# Patient Record
Sex: Female | Born: 1935
Health system: Southern US, Community
[De-identification: ages and names within clinical notes are randomized; demographics above are authoritative.]

## PROBLEM LIST (undated history)

## (undated) DIAGNOSIS — C50919 Malignant neoplasm of unspecified site of unspecified female breast: Secondary | ICD-10-CM

## (undated) DIAGNOSIS — R0602 Shortness of breath: Secondary | ICD-10-CM

## (undated) DIAGNOSIS — H3530 Unspecified macular degeneration: Secondary | ICD-10-CM

## (undated) DIAGNOSIS — M1990 Unspecified osteoarthritis, unspecified site: Secondary | ICD-10-CM

## (undated) DIAGNOSIS — I5032 Chronic diastolic (congestive) heart failure: Secondary | ICD-10-CM

## (undated) DIAGNOSIS — F419 Anxiety disorder, unspecified: Secondary | ICD-10-CM

## (undated) DIAGNOSIS — J42 Unspecified chronic bronchitis: Secondary | ICD-10-CM

## (undated) DIAGNOSIS — M5090 Cervical disc disorder, unspecified, unspecified cervical region: Secondary | ICD-10-CM

## (undated) DIAGNOSIS — J189 Pneumonia, unspecified organism: Secondary | ICD-10-CM

## (undated) DIAGNOSIS — E559 Vitamin D deficiency, unspecified: Secondary | ICD-10-CM

## (undated) DIAGNOSIS — F32A Depression, unspecified: Secondary | ICD-10-CM

## (undated) DIAGNOSIS — F329 Major depressive disorder, single episode, unspecified: Secondary | ICD-10-CM

## (undated) DIAGNOSIS — J159 Unspecified bacterial pneumonia: Secondary | ICD-10-CM

## (undated) DIAGNOSIS — R600 Localized edema: Secondary | ICD-10-CM

## (undated) DIAGNOSIS — I10 Essential (primary) hypertension: Secondary | ICD-10-CM

## (undated) DIAGNOSIS — I350 Nonrheumatic aortic (valve) stenosis: Secondary | ICD-10-CM

## (undated) DIAGNOSIS — I214 Non-ST elevation (NSTEMI) myocardial infarction: Secondary | ICD-10-CM

## (undated) DIAGNOSIS — R079 Chest pain, unspecified: Secondary | ICD-10-CM

## (undated) DIAGNOSIS — K219 Gastro-esophageal reflux disease without esophagitis: Secondary | ICD-10-CM

## (undated) DIAGNOSIS — E039 Hypothyroidism, unspecified: Secondary | ICD-10-CM

## (undated) HISTORY — DX: Hypothyroidism, unspecified: E03.9

## (undated) HISTORY — PX: CATARACT EXTRACTION W/ INTRAOCULAR LENS  IMPLANT, BILATERAL: SHX1307

## (undated) HISTORY — DX: Chronic diastolic (congestive) heart failure: I50.32

## (undated) HISTORY — DX: Cervical disc disorder, unspecified, unspecified cervical region: M50.90

## (undated) HISTORY — DX: Depression, unspecified: F32.A

## (undated) HISTORY — DX: Major depressive disorder, single episode, unspecified: F32.9

## (undated) HISTORY — DX: Unspecified macular degeneration: H35.30

## (undated) HISTORY — PX: BREAST BIOPSY: SHX20

## (undated) HISTORY — DX: Nonrheumatic aortic (valve) stenosis: I35.0

## (undated) HISTORY — DX: Vitamin D deficiency, unspecified: E55.9

## (undated) HISTORY — DX: Localized edema: R60.0

## (undated) HISTORY — DX: Anxiety disorder, unspecified: F41.9

## (undated) HISTORY — PX: BREAST LUMPECTOMY: SHX2

## (undated) HISTORY — PX: ABDOMINAL HYSTERECTOMY: SHX81

## (undated) HISTORY — DX: Malignant neoplasm of unspecified site of unspecified female breast: C50.919

## (undated) HISTORY — DX: Essential (primary) hypertension: I10

---
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
## 2011-11-14 DIAGNOSIS — M6281 Muscle weakness (generalized): Secondary | ICD-10-CM | POA: Diagnosis not present

## 2011-11-14 DIAGNOSIS — M542 Cervicalgia: Secondary | ICD-10-CM | POA: Diagnosis not present

## 2011-11-14 DIAGNOSIS — R262 Difficulty in walking, not elsewhere classified: Secondary | ICD-10-CM | POA: Diagnosis not present

## 2011-11-14 DIAGNOSIS — M25519 Pain in unspecified shoulder: Secondary | ICD-10-CM | POA: Diagnosis not present

## 2011-11-15 DIAGNOSIS — R011 Cardiac murmur, unspecified: Secondary | ICD-10-CM | POA: Diagnosis not present

## 2011-11-15 DIAGNOSIS — I10 Essential (primary) hypertension: Secondary | ICD-10-CM | POA: Diagnosis not present

## 2011-11-15 DIAGNOSIS — R079 Chest pain, unspecified: Secondary | ICD-10-CM | POA: Diagnosis not present

## 2011-11-15 DIAGNOSIS — R000 Tachycardia, unspecified: Secondary | ICD-10-CM | POA: Diagnosis not present

## 2011-11-19 DIAGNOSIS — M25519 Pain in unspecified shoulder: Secondary | ICD-10-CM | POA: Diagnosis not present

## 2011-11-19 DIAGNOSIS — M6281 Muscle weakness (generalized): Secondary | ICD-10-CM | POA: Diagnosis not present

## 2011-11-19 DIAGNOSIS — R262 Difficulty in walking, not elsewhere classified: Secondary | ICD-10-CM | POA: Diagnosis not present

## 2011-11-19 DIAGNOSIS — M542 Cervicalgia: Secondary | ICD-10-CM | POA: Diagnosis not present

## 2011-11-20 DIAGNOSIS — L0291 Cutaneous abscess, unspecified: Secondary | ICD-10-CM | POA: Diagnosis not present

## 2011-11-20 DIAGNOSIS — M7150 Other bursitis, not elsewhere classified, unspecified site: Secondary | ICD-10-CM | POA: Diagnosis not present

## 2011-11-20 DIAGNOSIS — I359 Nonrheumatic aortic valve disorder, unspecified: Secondary | ICD-10-CM | POA: Diagnosis not present

## 2011-11-20 DIAGNOSIS — L0390 Cellulitis, unspecified: Secondary | ICD-10-CM | POA: Diagnosis not present

## 2011-11-20 DIAGNOSIS — R609 Edema, unspecified: Secondary | ICD-10-CM | POA: Diagnosis not present

## 2011-11-20 DIAGNOSIS — M25559 Pain in unspecified hip: Secondary | ICD-10-CM | POA: Diagnosis not present

## 2011-11-20 DIAGNOSIS — M5412 Radiculopathy, cervical region: Secondary | ICD-10-CM | POA: Diagnosis not present

## 2011-11-21 DIAGNOSIS — M25519 Pain in unspecified shoulder: Secondary | ICD-10-CM | POA: Diagnosis not present

## 2011-11-21 DIAGNOSIS — M542 Cervicalgia: Secondary | ICD-10-CM | POA: Diagnosis not present

## 2011-11-21 DIAGNOSIS — M6281 Muscle weakness (generalized): Secondary | ICD-10-CM | POA: Diagnosis not present

## 2011-11-21 DIAGNOSIS — R262 Difficulty in walking, not elsewhere classified: Secondary | ICD-10-CM | POA: Diagnosis not present

## 2011-11-22 DIAGNOSIS — E871 Hypo-osmolality and hyponatremia: Secondary | ICD-10-CM | POA: Diagnosis not present

## 2011-11-26 DIAGNOSIS — M6281 Muscle weakness (generalized): Secondary | ICD-10-CM | POA: Diagnosis not present

## 2011-11-26 DIAGNOSIS — M542 Cervicalgia: Secondary | ICD-10-CM | POA: Diagnosis not present

## 2011-11-26 DIAGNOSIS — M25519 Pain in unspecified shoulder: Secondary | ICD-10-CM | POA: Diagnosis not present

## 2011-11-26 DIAGNOSIS — R262 Difficulty in walking, not elsewhere classified: Secondary | ICD-10-CM | POA: Diagnosis not present

## 2011-11-27 DIAGNOSIS — R609 Edema, unspecified: Secondary | ICD-10-CM | POA: Diagnosis not present

## 2011-11-27 DIAGNOSIS — I359 Nonrheumatic aortic valve disorder, unspecified: Secondary | ICD-10-CM | POA: Diagnosis not present

## 2011-11-27 DIAGNOSIS — M5126 Other intervertebral disc displacement, lumbar region: Secondary | ICD-10-CM | POA: Diagnosis not present

## 2011-11-27 DIAGNOSIS — M5020 Other cervical disc displacement, unspecified cervical region: Secondary | ICD-10-CM | POA: Diagnosis not present

## 2011-11-28 DIAGNOSIS — M542 Cervicalgia: Secondary | ICD-10-CM | POA: Diagnosis not present

## 2011-11-28 DIAGNOSIS — R262 Difficulty in walking, not elsewhere classified: Secondary | ICD-10-CM | POA: Diagnosis not present

## 2011-11-28 DIAGNOSIS — M25519 Pain in unspecified shoulder: Secondary | ICD-10-CM | POA: Diagnosis not present

## 2011-11-28 DIAGNOSIS — M6281 Muscle weakness (generalized): Secondary | ICD-10-CM | POA: Diagnosis not present

## 2011-11-29 DIAGNOSIS — M5020 Other cervical disc displacement, unspecified cervical region: Secondary | ICD-10-CM | POA: Diagnosis not present

## 2011-11-29 DIAGNOSIS — M5412 Radiculopathy, cervical region: Secondary | ICD-10-CM | POA: Diagnosis not present

## 2011-11-29 DIAGNOSIS — Z79899 Other long term (current) drug therapy: Secondary | ICD-10-CM | POA: Diagnosis not present

## 2011-12-04 DIAGNOSIS — I359 Nonrheumatic aortic valve disorder, unspecified: Secondary | ICD-10-CM | POA: Diagnosis not present

## 2011-12-04 DIAGNOSIS — F329 Major depressive disorder, single episode, unspecified: Secondary | ICD-10-CM | POA: Diagnosis not present

## 2011-12-04 DIAGNOSIS — M5412 Radiculopathy, cervical region: Secondary | ICD-10-CM | POA: Diagnosis not present

## 2011-12-04 DIAGNOSIS — R609 Edema, unspecified: Secondary | ICD-10-CM | POA: Diagnosis not present

## 2011-12-05 DIAGNOSIS — M25519 Pain in unspecified shoulder: Secondary | ICD-10-CM | POA: Diagnosis not present

## 2011-12-05 DIAGNOSIS — M542 Cervicalgia: Secondary | ICD-10-CM | POA: Diagnosis not present

## 2011-12-05 DIAGNOSIS — R262 Difficulty in walking, not elsewhere classified: Secondary | ICD-10-CM | POA: Diagnosis not present

## 2011-12-05 DIAGNOSIS — M6281 Muscle weakness (generalized): Secondary | ICD-10-CM | POA: Diagnosis not present

## 2011-12-07 DIAGNOSIS — M542 Cervicalgia: Secondary | ICD-10-CM | POA: Diagnosis not present

## 2011-12-07 DIAGNOSIS — M25519 Pain in unspecified shoulder: Secondary | ICD-10-CM | POA: Diagnosis not present

## 2011-12-07 DIAGNOSIS — R262 Difficulty in walking, not elsewhere classified: Secondary | ICD-10-CM | POA: Diagnosis not present

## 2011-12-07 DIAGNOSIS — M6281 Muscle weakness (generalized): Secondary | ICD-10-CM | POA: Diagnosis not present

## 2011-12-10 DIAGNOSIS — M79609 Pain in unspecified limb: Secondary | ICD-10-CM | POA: Diagnosis not present

## 2011-12-10 DIAGNOSIS — M545 Low back pain: Secondary | ICD-10-CM | POA: Diagnosis not present

## 2011-12-10 DIAGNOSIS — M76899 Other specified enthesopathies of unspecified lower limb, excluding foot: Secondary | ICD-10-CM | POA: Diagnosis not present

## 2011-12-11 DIAGNOSIS — M5126 Other intervertebral disc displacement, lumbar region: Secondary | ICD-10-CM | POA: Diagnosis not present

## 2011-12-11 DIAGNOSIS — M5020 Other cervical disc displacement, unspecified cervical region: Secondary | ICD-10-CM | POA: Diagnosis not present

## 2011-12-12 DIAGNOSIS — M25519 Pain in unspecified shoulder: Secondary | ICD-10-CM | POA: Diagnosis not present

## 2011-12-12 DIAGNOSIS — R262 Difficulty in walking, not elsewhere classified: Secondary | ICD-10-CM | POA: Diagnosis not present

## 2011-12-12 DIAGNOSIS — M542 Cervicalgia: Secondary | ICD-10-CM | POA: Diagnosis not present

## 2011-12-12 DIAGNOSIS — M6281 Muscle weakness (generalized): Secondary | ICD-10-CM | POA: Diagnosis not present

## 2011-12-14 DIAGNOSIS — M6281 Muscle weakness (generalized): Secondary | ICD-10-CM | POA: Diagnosis not present

## 2011-12-14 DIAGNOSIS — R279 Unspecified lack of coordination: Secondary | ICD-10-CM | POA: Diagnosis not present

## 2011-12-14 DIAGNOSIS — R262 Difficulty in walking, not elsewhere classified: Secondary | ICD-10-CM | POA: Diagnosis not present

## 2011-12-14 DIAGNOSIS — M542 Cervicalgia: Secondary | ICD-10-CM | POA: Diagnosis not present

## 2011-12-17 DIAGNOSIS — M6281 Muscle weakness (generalized): Secondary | ICD-10-CM | POA: Diagnosis not present

## 2011-12-17 DIAGNOSIS — M542 Cervicalgia: Secondary | ICD-10-CM | POA: Diagnosis not present

## 2011-12-17 DIAGNOSIS — R262 Difficulty in walking, not elsewhere classified: Secondary | ICD-10-CM | POA: Diagnosis not present

## 2011-12-17 DIAGNOSIS — R279 Unspecified lack of coordination: Secondary | ICD-10-CM | POA: Diagnosis not present

## 2011-12-18 DIAGNOSIS — R609 Edema, unspecified: Secondary | ICD-10-CM | POA: Diagnosis not present

## 2011-12-19 DIAGNOSIS — M6281 Muscle weakness (generalized): Secondary | ICD-10-CM | POA: Diagnosis not present

## 2011-12-19 DIAGNOSIS — R279 Unspecified lack of coordination: Secondary | ICD-10-CM | POA: Diagnosis not present

## 2011-12-19 DIAGNOSIS — R262 Difficulty in walking, not elsewhere classified: Secondary | ICD-10-CM | POA: Diagnosis not present

## 2011-12-19 DIAGNOSIS — M542 Cervicalgia: Secondary | ICD-10-CM | POA: Diagnosis not present

## 2011-12-20 DIAGNOSIS — Z961 Presence of intraocular lens: Secondary | ICD-10-CM | POA: Diagnosis not present

## 2011-12-20 DIAGNOSIS — H353190 Nonexudative age-related macular degeneration, unspecified eye, stage unspecified: Secondary | ICD-10-CM | POA: Diagnosis not present

## 2011-12-21 DIAGNOSIS — M6281 Muscle weakness (generalized): Secondary | ICD-10-CM | POA: Diagnosis not present

## 2011-12-21 DIAGNOSIS — R279 Unspecified lack of coordination: Secondary | ICD-10-CM | POA: Diagnosis not present

## 2011-12-21 DIAGNOSIS — R262 Difficulty in walking, not elsewhere classified: Secondary | ICD-10-CM | POA: Diagnosis not present

## 2011-12-21 DIAGNOSIS — M542 Cervicalgia: Secondary | ICD-10-CM | POA: Diagnosis not present

## 2011-12-24 DIAGNOSIS — M25549 Pain in joints of unspecified hand: Secondary | ICD-10-CM | POA: Diagnosis not present

## 2011-12-24 DIAGNOSIS — R262 Difficulty in walking, not elsewhere classified: Secondary | ICD-10-CM | POA: Diagnosis not present

## 2011-12-24 DIAGNOSIS — M542 Cervicalgia: Secondary | ICD-10-CM | POA: Diagnosis not present

## 2011-12-24 DIAGNOSIS — R279 Unspecified lack of coordination: Secondary | ICD-10-CM | POA: Diagnosis not present

## 2011-12-24 DIAGNOSIS — M6281 Muscle weakness (generalized): Secondary | ICD-10-CM | POA: Diagnosis not present

## 2011-12-25 DIAGNOSIS — M5020 Other cervical disc displacement, unspecified cervical region: Secondary | ICD-10-CM | POA: Diagnosis not present

## 2011-12-25 DIAGNOSIS — M5126 Other intervertebral disc displacement, lumbar region: Secondary | ICD-10-CM | POA: Diagnosis not present

## 2011-12-27 DIAGNOSIS — Z79899 Other long term (current) drug therapy: Secondary | ICD-10-CM | POA: Diagnosis not present

## 2011-12-27 DIAGNOSIS — M5126 Other intervertebral disc displacement, lumbar region: Secondary | ICD-10-CM | POA: Diagnosis not present

## 2011-12-27 DIAGNOSIS — M5412 Radiculopathy, cervical region: Secondary | ICD-10-CM | POA: Diagnosis not present

## 2011-12-27 DIAGNOSIS — M5020 Other cervical disc displacement, unspecified cervical region: Secondary | ICD-10-CM | POA: Diagnosis not present

## 2011-12-28 DIAGNOSIS — M25549 Pain in joints of unspecified hand: Secondary | ICD-10-CM | POA: Diagnosis not present

## 2011-12-28 DIAGNOSIS — M542 Cervicalgia: Secondary | ICD-10-CM | POA: Diagnosis not present

## 2011-12-28 DIAGNOSIS — R262 Difficulty in walking, not elsewhere classified: Secondary | ICD-10-CM | POA: Diagnosis not present

## 2011-12-28 DIAGNOSIS — R279 Unspecified lack of coordination: Secondary | ICD-10-CM | POA: Diagnosis not present

## 2011-12-28 DIAGNOSIS — M6281 Muscle weakness (generalized): Secondary | ICD-10-CM | POA: Diagnosis not present

## 2011-12-31 DIAGNOSIS — M25549 Pain in joints of unspecified hand: Secondary | ICD-10-CM | POA: Diagnosis not present

## 2011-12-31 DIAGNOSIS — R279 Unspecified lack of coordination: Secondary | ICD-10-CM | POA: Diagnosis not present

## 2011-12-31 DIAGNOSIS — M6281 Muscle weakness (generalized): Secondary | ICD-10-CM | POA: Diagnosis not present

## 2011-12-31 DIAGNOSIS — M542 Cervicalgia: Secondary | ICD-10-CM | POA: Diagnosis not present

## 2011-12-31 DIAGNOSIS — R262 Difficulty in walking, not elsewhere classified: Secondary | ICD-10-CM | POA: Diagnosis not present

## 2012-01-02 DIAGNOSIS — R05 Cough: Secondary | ICD-10-CM | POA: Diagnosis not present

## 2012-01-02 DIAGNOSIS — J40 Bronchitis, not specified as acute or chronic: Secondary | ICD-10-CM | POA: Diagnosis not present

## 2012-01-02 DIAGNOSIS — R0989 Other specified symptoms and signs involving the circulatory and respiratory systems: Secondary | ICD-10-CM | POA: Diagnosis not present

## 2012-01-04 DIAGNOSIS — J40 Bronchitis, not specified as acute or chronic: Secondary | ICD-10-CM | POA: Diagnosis not present

## 2012-01-04 DIAGNOSIS — I471 Supraventricular tachycardia: Secondary | ICD-10-CM | POA: Diagnosis not present

## 2012-01-04 DIAGNOSIS — R609 Edema, unspecified: Secondary | ICD-10-CM | POA: Diagnosis not present

## 2012-01-08 DIAGNOSIS — J40 Bronchitis, not specified as acute or chronic: Secondary | ICD-10-CM | POA: Diagnosis not present

## 2012-01-08 DIAGNOSIS — I471 Supraventricular tachycardia: Secondary | ICD-10-CM | POA: Diagnosis not present

## 2012-01-08 DIAGNOSIS — R609 Edema, unspecified: Secondary | ICD-10-CM | POA: Diagnosis not present

## 2012-01-10 DIAGNOSIS — M6281 Muscle weakness (generalized): Secondary | ICD-10-CM | POA: Diagnosis not present

## 2012-01-10 DIAGNOSIS — M25549 Pain in joints of unspecified hand: Secondary | ICD-10-CM | POA: Diagnosis not present

## 2012-01-10 DIAGNOSIS — R279 Unspecified lack of coordination: Secondary | ICD-10-CM | POA: Diagnosis not present

## 2012-01-11 DIAGNOSIS — R279 Unspecified lack of coordination: Secondary | ICD-10-CM | POA: Diagnosis not present

## 2012-01-11 DIAGNOSIS — M25549 Pain in joints of unspecified hand: Secondary | ICD-10-CM | POA: Diagnosis not present

## 2012-01-11 DIAGNOSIS — M6281 Muscle weakness (generalized): Secondary | ICD-10-CM | POA: Diagnosis not present

## 2012-01-13 DIAGNOSIS — I509 Heart failure, unspecified: Secondary | ICD-10-CM | POA: Diagnosis not present

## 2012-01-13 DIAGNOSIS — J40 Bronchitis, not specified as acute or chronic: Secondary | ICD-10-CM | POA: Diagnosis not present

## 2012-01-14 DIAGNOSIS — M25549 Pain in joints of unspecified hand: Secondary | ICD-10-CM | POA: Diagnosis not present

## 2012-01-14 DIAGNOSIS — M6281 Muscle weakness (generalized): Secondary | ICD-10-CM | POA: Diagnosis not present

## 2012-01-14 DIAGNOSIS — R279 Unspecified lack of coordination: Secondary | ICD-10-CM | POA: Diagnosis not present

## 2012-01-15 DIAGNOSIS — I509 Heart failure, unspecified: Secondary | ICD-10-CM | POA: Diagnosis not present

## 2012-01-15 DIAGNOSIS — J40 Bronchitis, not specified as acute or chronic: Secondary | ICD-10-CM | POA: Diagnosis not present

## 2012-01-16 DIAGNOSIS — R279 Unspecified lack of coordination: Secondary | ICD-10-CM | POA: Diagnosis not present

## 2012-01-16 DIAGNOSIS — M6281 Muscle weakness (generalized): Secondary | ICD-10-CM | POA: Diagnosis not present

## 2012-01-16 DIAGNOSIS — M25549 Pain in joints of unspecified hand: Secondary | ICD-10-CM | POA: Diagnosis not present

## 2012-01-16 DIAGNOSIS — I509 Heart failure, unspecified: Secondary | ICD-10-CM | POA: Diagnosis not present

## 2012-01-16 DIAGNOSIS — J40 Bronchitis, not specified as acute or chronic: Secondary | ICD-10-CM | POA: Diagnosis not present

## 2012-01-18 DIAGNOSIS — M6281 Muscle weakness (generalized): Secondary | ICD-10-CM | POA: Diagnosis not present

## 2012-01-18 DIAGNOSIS — R279 Unspecified lack of coordination: Secondary | ICD-10-CM | POA: Diagnosis not present

## 2012-01-18 DIAGNOSIS — M25549 Pain in joints of unspecified hand: Secondary | ICD-10-CM | POA: Diagnosis not present

## 2012-01-21 DIAGNOSIS — J40 Bronchitis, not specified as acute or chronic: Secondary | ICD-10-CM | POA: Diagnosis not present

## 2012-01-21 DIAGNOSIS — I509 Heart failure, unspecified: Secondary | ICD-10-CM | POA: Diagnosis not present

## 2012-01-24 DIAGNOSIS — I359 Nonrheumatic aortic valve disorder, unspecified: Secondary | ICD-10-CM | POA: Diagnosis not present

## 2012-01-24 DIAGNOSIS — I498 Other specified cardiac arrhythmias: Secondary | ICD-10-CM | POA: Diagnosis not present

## 2012-01-24 DIAGNOSIS — R609 Edema, unspecified: Secondary | ICD-10-CM | POA: Diagnosis not present

## 2012-01-24 DIAGNOSIS — E039 Hypothyroidism, unspecified: Secondary | ICD-10-CM | POA: Diagnosis not present

## 2012-02-05 DIAGNOSIS — I509 Heart failure, unspecified: Secondary | ICD-10-CM | POA: Diagnosis not present

## 2012-02-14 ENCOUNTER — Other Ambulatory Visit: Payer: Self-pay | Admitting: Family Medicine

## 2012-02-14 ENCOUNTER — Ambulatory Visit
Admission: RE | Admit: 2012-02-14 | Discharge: 2012-02-14 | Disposition: A | Discharge status: Home / Self Care | Payer: Medicare Other | Source: Ambulatory Visit | Attending: Family Medicine | Admitting: Family Medicine

## 2012-02-14 DIAGNOSIS — M7989 Other specified soft tissue disorders: Secondary | ICD-10-CM

## 2012-02-14 DIAGNOSIS — H01119 Allergic dermatitis of unspecified eye, unspecified eyelid: Secondary | ICD-10-CM | POA: Diagnosis not present

## 2012-02-14 DIAGNOSIS — I509 Heart failure, unspecified: Secondary | ICD-10-CM | POA: Diagnosis not present

## 2012-02-14 DIAGNOSIS — I10 Essential (primary) hypertension: Secondary | ICD-10-CM | POA: Diagnosis not present

## 2012-02-14 DIAGNOSIS — R609 Edema, unspecified: Secondary | ICD-10-CM | POA: Diagnosis not present

## 2012-02-14 DIAGNOSIS — M79609 Pain in unspecified limb: Secondary | ICD-10-CM | POA: Diagnosis not present

## 2012-02-19 DIAGNOSIS — R609 Edema, unspecified: Secondary | ICD-10-CM | POA: Diagnosis not present

## 2012-02-27 DIAGNOSIS — R609 Edema, unspecified: Secondary | ICD-10-CM | POA: Diagnosis not present

## 2012-02-27 DIAGNOSIS — R0602 Shortness of breath: Secondary | ICD-10-CM | POA: Diagnosis not present

## 2012-02-28 DIAGNOSIS — R0602 Shortness of breath: Secondary | ICD-10-CM | POA: Diagnosis not present

## 2012-02-28 DIAGNOSIS — R609 Edema, unspecified: Secondary | ICD-10-CM | POA: Diagnosis not present

## 2012-02-29 DIAGNOSIS — I10 Essential (primary) hypertension: Secondary | ICD-10-CM | POA: Diagnosis not present

## 2012-02-29 DIAGNOSIS — R609 Edema, unspecified: Secondary | ICD-10-CM | POA: Diagnosis not present

## 2012-02-29 DIAGNOSIS — I509 Heart failure, unspecified: Secondary | ICD-10-CM | POA: Diagnosis not present

## 2012-02-29 DIAGNOSIS — R0602 Shortness of breath: Secondary | ICD-10-CM | POA: Diagnosis not present

## 2012-02-29 DIAGNOSIS — I359 Nonrheumatic aortic valve disorder, unspecified: Secondary | ICD-10-CM | POA: Diagnosis not present

## 2012-03-04 DIAGNOSIS — I5030 Unspecified diastolic (congestive) heart failure: Secondary | ICD-10-CM | POA: Diagnosis not present

## 2012-03-04 DIAGNOSIS — R609 Edema, unspecified: Secondary | ICD-10-CM | POA: Diagnosis not present

## 2012-03-04 DIAGNOSIS — H6120 Impacted cerumen, unspecified ear: Secondary | ICD-10-CM | POA: Diagnosis not present

## 2012-03-07 DIAGNOSIS — I5030 Unspecified diastolic (congestive) heart failure: Secondary | ICD-10-CM | POA: Diagnosis not present

## 2012-03-07 DIAGNOSIS — R609 Edema, unspecified: Secondary | ICD-10-CM | POA: Diagnosis not present

## 2012-03-07 DIAGNOSIS — Z79899 Other long term (current) drug therapy: Secondary | ICD-10-CM | POA: Diagnosis not present

## 2012-03-19 DIAGNOSIS — J209 Acute bronchitis, unspecified: Secondary | ICD-10-CM | POA: Diagnosis not present

## 2012-03-19 DIAGNOSIS — R05 Cough: Secondary | ICD-10-CM | POA: Diagnosis not present

## 2012-04-01 DIAGNOSIS — R05 Cough: Secondary | ICD-10-CM | POA: Diagnosis not present

## 2012-04-01 DIAGNOSIS — R0602 Shortness of breath: Secondary | ICD-10-CM | POA: Diagnosis not present

## 2012-04-02 DIAGNOSIS — R5383 Other fatigue: Secondary | ICD-10-CM | POA: Diagnosis not present

## 2012-04-02 DIAGNOSIS — E559 Vitamin D deficiency, unspecified: Secondary | ICD-10-CM | POA: Diagnosis not present

## 2012-04-02 DIAGNOSIS — R5381 Other malaise: Secondary | ICD-10-CM | POA: Diagnosis not present

## 2012-04-02 DIAGNOSIS — E039 Hypothyroidism, unspecified: Secondary | ICD-10-CM | POA: Diagnosis not present

## 2012-04-08 DIAGNOSIS — H905 Unspecified sensorineural hearing loss: Secondary | ICD-10-CM | POA: Diagnosis not present

## 2012-04-17 DIAGNOSIS — I5030 Unspecified diastolic (congestive) heart failure: Secondary | ICD-10-CM | POA: Diagnosis not present

## 2012-04-17 DIAGNOSIS — I359 Nonrheumatic aortic valve disorder, unspecified: Secondary | ICD-10-CM | POA: Diagnosis not present

## 2012-04-17 DIAGNOSIS — I10 Essential (primary) hypertension: Secondary | ICD-10-CM | POA: Diagnosis not present

## 2012-04-17 DIAGNOSIS — Z79899 Other long term (current) drug therapy: Secondary | ICD-10-CM | POA: Diagnosis not present

## 2012-04-17 DIAGNOSIS — R609 Edema, unspecified: Secondary | ICD-10-CM | POA: Diagnosis not present

## 2012-05-12 DIAGNOSIS — I350 Nonrheumatic aortic (valve) stenosis: Secondary | ICD-10-CM

## 2012-05-12 HISTORY — DX: Nonrheumatic aortic (valve) stenosis: I35.0

## 2012-05-17 DIAGNOSIS — R0602 Shortness of breath: Secondary | ICD-10-CM | POA: Diagnosis not present

## 2012-05-19 ENCOUNTER — Other Ambulatory Visit: Payer: Self-pay | Admitting: Family Medicine

## 2012-05-19 ENCOUNTER — Ambulatory Visit
Admission: RE | Admit: 2012-05-19 | Discharge: 2012-05-19 | Disposition: A | Discharge status: Home / Self Care | Payer: Medicare Other | Source: Ambulatory Visit | Attending: Family Medicine | Admitting: Family Medicine

## 2012-05-19 DIAGNOSIS — R0602 Shortness of breath: Secondary | ICD-10-CM | POA: Diagnosis not present

## 2012-05-19 DIAGNOSIS — R7989 Other specified abnormal findings of blood chemistry: Secondary | ICD-10-CM

## 2012-05-19 DIAGNOSIS — E559 Vitamin D deficiency, unspecified: Secondary | ICD-10-CM | POA: Diagnosis not present

## 2012-05-19 DIAGNOSIS — R918 Other nonspecific abnormal finding of lung field: Secondary | ICD-10-CM | POA: Diagnosis not present

## 2012-05-19 DIAGNOSIS — J984 Other disorders of lung: Secondary | ICD-10-CM | POA: Diagnosis not present

## 2012-05-19 MED ORDER — IOHEXOL 350 MG/ML SOLN
100.0000 mL | Freq: Once | INTRAVENOUS | Status: AC | PRN
  Administered 2012-05-19: 100 mL via INTRAVENOUS

## 2012-05-21 ENCOUNTER — Emergency Department (HOSPITAL_COMMUNITY): Payer: Medicare Other

## 2012-05-21 ENCOUNTER — Emergency Department (HOSPITAL_COMMUNITY)
Admission: EM | Admit: 2012-05-21 | Discharge: 2012-05-21 | Disposition: A | Discharge status: Home / Self Care | Payer: Medicare Other | Attending: Emergency Medicine | Admitting: Emergency Medicine

## 2012-05-21 DIAGNOSIS — I509 Heart failure, unspecified: Secondary | ICD-10-CM | POA: Insufficient documentation

## 2012-05-21 DIAGNOSIS — R0602 Shortness of breath: Secondary | ICD-10-CM | POA: Diagnosis not present

## 2012-05-21 DIAGNOSIS — Z79899 Other long term (current) drug therapy: Secondary | ICD-10-CM | POA: Diagnosis not present

## 2012-05-21 DIAGNOSIS — E559 Vitamin D deficiency, unspecified: Secondary | ICD-10-CM | POA: Diagnosis not present

## 2012-05-21 DIAGNOSIS — I517 Cardiomegaly: Secondary | ICD-10-CM | POA: Diagnosis not present

## 2012-05-21 DIAGNOSIS — R5381 Other malaise: Secondary | ICD-10-CM | POA: Diagnosis not present

## 2012-05-21 DIAGNOSIS — R5383 Other fatigue: Secondary | ICD-10-CM | POA: Diagnosis not present

## 2012-05-21 DIAGNOSIS — E039 Hypothyroidism, unspecified: Secondary | ICD-10-CM | POA: Diagnosis not present

## 2012-05-21 LAB — PRO B NATRIURETIC PEPTIDE: Pro B Natriuretic peptide (BNP): 1940 pg/mL — ABNORMAL HIGH (ref 0–450)

## 2012-05-21 LAB — CBC WITH DIFFERENTIAL/PLATELET
Basophils Relative: 1 % (ref 0–1)
HCT: 40.4 % (ref 36.0–46.0)
Hemoglobin: 13.1 g/dL (ref 12.0–15.0)
Lymphocytes Relative: 36 % (ref 12–46)
Lymphs Abs: 2.1 10*3/uL (ref 0.7–4.0)
MCHC: 32.4 g/dL (ref 30.0–36.0)
Monocytes Absolute: 0.8 10*3/uL (ref 0.1–1.0)
Monocytes Relative: 14 % — ABNORMAL HIGH (ref 3–12)
Neutro Abs: 2.7 10*3/uL (ref 1.7–7.7)
Neutrophils Relative %: 47 % (ref 43–77)
RBC: 4.33 MIL/uL (ref 3.87–5.11)

## 2012-05-21 LAB — POCT I-STAT TROPONIN I

## 2012-05-21 LAB — COMPREHENSIVE METABOLIC PANEL
ALT: 17 U/L (ref 0–35)
Calcium: 9.8 mg/dL (ref 8.4–10.5)
Creatinine, Ser: 1.03 mg/dL (ref 0.50–1.10)
GFR calc Af Amer: 52 mL/min — ABNORMAL LOW (ref >90)
Glucose, Bld: 101 mg/dL — ABNORMAL HIGH (ref 70–99)
Sodium: 141 mEq/L (ref 135–145)
Total Protein: 7.5 g/dL (ref 6.0–8.3)

## 2012-05-21 LAB — CK TOTAL AND CKMB (NOT AT ARMC)
CK, MB: 2 ng/mL (ref 0.3–4.0)
Total CK: 56 U/L (ref 7–177)

## 2012-05-21 MED ORDER — FUROSEMIDE 10 MG/ML IJ SOLN
40.0000 mg | Freq: Once | INTRAMUSCULAR | Status: AC
  Administered 2012-05-21: 40 mg via INTRAVENOUS
  Filled 2012-05-21: qty 4

## 2012-05-21 NOTE — ED Notes (Signed)
The patient is AOx4 and comfortable with her discharge instructions.  Her son is driving her home.

## 2012-05-21 NOTE — ED Notes (Signed)
Started on Monday on 80 mg of lasix, pt sts that she is no better, this was double her daily dose. Pt complains of sob with exertion and low oxygen sat hx of chf

## 2012-05-21 NOTE — ED Notes (Signed)
Placed two rings, necklace, and watch in specimen cup. Given to daughter at bedside

## 2012-05-21 NOTE — ED Provider Notes (Addendum)
History     CSN: [id number]  Arrival date & time 05/21/12  1701   First MD Initiated Contact with Patient 05/21/12 1821      Chief Complaint  Patient presents with  . Congestive Heart Failure    (Consider location/radiation/quality/duration/timing/severity/associated sxs/prior treatment) Patient is a 78 y.o. female presenting with CHF. The history is provided by the patient, a relative and a caregiver.  Congestive Heart Failure Pertinent negatives include no abdominal pain, chest pain, coughing, fever, nausea or vomiting. The symptoms are aggravated by exertion.   78 year old female in no acute distress complaining of shortness of breath worsening over the last 5 days. Patient has history of CHF normally takes 40 mg of Lasix daily seen by her primary care doctor and increased to 80 mg of Lasix for 3 days, with no relief and shortness of breath. Shortness of breath is only on exertion. Denies chest pain, fever, cough, and leg swelling is under control. Pt had recent work up for PE which was negative. Pt lost 5 lbs in the last leek. Has not had recent cardiology evaluation. 1 pillow orthopnea, can walk 50 yards with out SOB. O2 sats in low 90's on RA   No past medical history on file.  No past surgical history on file.  No family history on file.  History  Substance Use Topics  . Smoking status: Not on file  . Smokeless tobacco: Not on file  . Alcohol Use: Not on file    OB History    No data available      Review of Systems  Constitutional: Negative for fever.  Respiratory: Positive for shortness of breath. Negative for cough, choking, chest tightness, wheezing and stridor.   Cardiovascular: Negative for chest pain, palpitations and leg swelling.  Gastrointestinal: Negative for nausea, vomiting, abdominal pain and abdominal distention.  Genitourinary: Negative for dysuria.  All other systems reviewed and are negative.    Allergies  Codeine and Penicillins  Home  Medications   Current Outpatient Rx  Name Route Sig Dispense Refill  . CHOLECALCIFEROL 2000 UNITS PO TABS Oral Take 1 tablet by mouth daily.    . ERYTHROMYCIN 5 MG/GM OP OINT Ophthalmic Apply 1 application to eye every 6 (six) hours.    . ESCITALOPRAM OXALATE 10 MG PO TABS Oral Take 5 mg by mouth daily.    . FUROSEMIDE 80 MG PO TABS Oral Take 80 mg by mouth daily.    Marland Kitchen LEVOTHYROXINE SODIUM 88 MCG PO TABS Oral Take 88 mcg by mouth daily.    . ADULT MULTIVITAMIN W/MINERALS CH Oral Take 1 tablet by mouth daily.    Marland Kitchen PINDOLOL 5 MG PO TABS Oral Take 5 mg by mouth daily.    Marland Kitchen POTASSIUM CHLORIDE ER 10 MEQ PO TBCR Oral Take 10 mEq by mouth daily.    Marland Kitchen VITAMIN C 500 MG PO TABS Oral Take 500 mg by mouth daily.    . ERGOCALCIFEROL 50000 UNITS PO CAPS Oral Take 50,000 Units by mouth once a week.      BP 139/53  Pulse 58  Temp 98 F (36.7 C) (Oral)  Resp 17  Ht 5' (1.524 m)  Wt 169 lb (76.658 kg)  BMI 33.01 kg/m2  SpO2 96%  Physical Exam  Nursing note and vitals reviewed. Constitutional: She is oriented to person, place, and time. She appears well-developed and well-nourished. No distress.  HENT:  Head: Normocephalic.  Eyes: Conjunctivae and EOM are normal.  Cardiovascular: Normal rate.   Pulmonary/Chest:  Effort normal and breath sounds normal. No respiratory distress. She has no wheezes. She has no rales. She exhibits no tenderness.  Abdominal: Soft.  Musculoskeletal: Normal range of motion.  Neurological: She is alert and oriented to person, place, and time.       Coordinated gate, ambulates with walker  Psychiatric: She has a normal mood and affect.    ED Course  Procedures (including critical care time)  Labs Reviewed  PRO B NATRIURETIC PEPTIDE - Abnormal; Notable for the following:    Pro B Natriuretic peptide (BNP) 1940.0 (*)     All other components within normal limits  COMPREHENSIVE METABOLIC PANEL - Abnormal; Notable for the following:    Glucose, Bld 101 (*)     GFR  calc non Af Amer 45 (*)     GFR calc Af Amer 52 (*)     All other components within normal limits  CBC WITH DIFFERENTIAL - Abnormal; Notable for the following:    Monocytes Relative 14 (*)     All other components within normal limits  CK TOTAL AND CKMB  POCT I-STAT TROPONIN I   Dg Chest 2 View  05/21/2012  *RADIOLOGY REPORT*  Clinical Data: Congestive heart failure.  Short of breath.  CHEST - 2 VIEW  Comparison: 04/01/2012  Findings: The heart is enlarged.  There is calcification and unfolding of the aorta.  Mitral annular calcification is noted. Lungs are clear.  No effusions.  Ordinary degenerative changes effect the spine.  IMPRESSION: Cardiomegaly.  No active disease.  No pulmonary edema or effusions.  Original Report Authenticated By: Thomasenia Sales, M.D.     1. SOBOE (shortness of breath on exertion)       MDM  78 y/o female female with SOB on exertion x5 days. Pt has recent work up for PE which was negative. Lungs sounds show mild bilateral rales at the bases. CXR shows cardiomegaly  With no pleural effusion. SaO2 in the low 90's on RA. Walked Pt around unit and and O2 went as low at 89% with no subjective SOB. BNP 1940 no prior result to compare with. Shared Visit with Dr. Judd Lien who agrees with plan and stability to d/c to home.  Pt and son verbalized understanding and agrees with care plan. Outpatient follow-up and return precautions given.           Wynetta Emery, PA-C 05/21/12 2224  Wynetta Emery, PA-C 06/18/12 1038

## 2012-05-21 NOTE — ED Notes (Addendum)
Pt MD advised pt to come to ED for CHF exacerbation. Pt has had slight improvement since beginning of the week, but pt is still not back to baseline. BNP was elevated in 500s. Pt in NAD and does not appear in any respiratory distress. 97% on 2L. Per family, SOB is exertional, any minimal exercise makes pt very tired.

## 2012-05-22 NOTE — ED Provider Notes (Signed)
Medical screening examination/treatment/procedure(s) were conducted as a shared visit with non-physician practitioner(s) and myself.  I personally evaluated the patient during the encounter.  I saw the patient along with N. Pisciotta and agree with her note, assessment, and plan.  The patient presents complaining of increasing doe for the past several days.  She has a history of chf.  She was seen by her pcp a few days ago and her lasix was increased.  She denies cough, fever, chest pain.  She had a ct of her chest recently that was negative for pe.  On exam, the heart is rrr and the lungs have mild crackles in the bases bilaterally.  The abd is benign and the extremities have 1+ edema.  Her oxygen saturations were 93% while I was examining her on room air.  The workup today reveals an elevated bnp, unchanged ekg, and no overt signs of pulmonary edema on chest xray.  She was ambulated and felt well, with sats remaining in the lower 90's/upper 80's.  She is adamant about going home.  She will continue on her increased dose of lasix and return if her symptoms worsen.  It does not appear as though there has been an acute cardiac event or infection, and she has been ruled out for pe this week by her pcp.  Geoffery Lyons, MD 05/22/12 [phone]

## 2012-05-27 DIAGNOSIS — R609 Edema, unspecified: Secondary | ICD-10-CM | POA: Diagnosis not present

## 2012-05-27 DIAGNOSIS — I5030 Unspecified diastolic (congestive) heart failure: Secondary | ICD-10-CM | POA: Diagnosis not present

## 2012-05-27 DIAGNOSIS — I10 Essential (primary) hypertension: Secondary | ICD-10-CM | POA: Diagnosis not present

## 2012-06-10 DIAGNOSIS — M6281 Muscle weakness (generalized): Secondary | ICD-10-CM | POA: Diagnosis not present

## 2012-06-10 DIAGNOSIS — R269 Unspecified abnormalities of gait and mobility: Secondary | ICD-10-CM | POA: Diagnosis not present

## 2012-06-10 DIAGNOSIS — Z9181 History of falling: Secondary | ICD-10-CM | POA: Diagnosis not present

## 2012-06-10 DIAGNOSIS — R262 Difficulty in walking, not elsewhere classified: Secondary | ICD-10-CM | POA: Diagnosis not present

## 2012-06-10 DIAGNOSIS — R279 Unspecified lack of coordination: Secondary | ICD-10-CM | POA: Diagnosis not present

## 2012-06-11 DIAGNOSIS — Z9181 History of falling: Secondary | ICD-10-CM | POA: Diagnosis not present

## 2012-06-11 DIAGNOSIS — R279 Unspecified lack of coordination: Secondary | ICD-10-CM | POA: Diagnosis not present

## 2012-06-11 DIAGNOSIS — M6281 Muscle weakness (generalized): Secondary | ICD-10-CM | POA: Diagnosis not present

## 2012-06-11 DIAGNOSIS — R262 Difficulty in walking, not elsewhere classified: Secondary | ICD-10-CM | POA: Diagnosis not present

## 2012-06-11 DIAGNOSIS — R269 Unspecified abnormalities of gait and mobility: Secondary | ICD-10-CM | POA: Diagnosis not present

## 2012-06-12 DIAGNOSIS — R269 Unspecified abnormalities of gait and mobility: Secondary | ICD-10-CM | POA: Diagnosis not present

## 2012-06-12 DIAGNOSIS — Z9181 History of falling: Secondary | ICD-10-CM | POA: Diagnosis not present

## 2012-06-12 DIAGNOSIS — R262 Difficulty in walking, not elsewhere classified: Secondary | ICD-10-CM | POA: Diagnosis not present

## 2012-06-12 DIAGNOSIS — R279 Unspecified lack of coordination: Secondary | ICD-10-CM | POA: Diagnosis not present

## 2012-06-12 DIAGNOSIS — M6281 Muscle weakness (generalized): Secondary | ICD-10-CM | POA: Diagnosis not present

## 2012-06-16 DIAGNOSIS — R269 Unspecified abnormalities of gait and mobility: Secondary | ICD-10-CM | POA: Diagnosis not present

## 2012-06-16 DIAGNOSIS — M6281 Muscle weakness (generalized): Secondary | ICD-10-CM | POA: Diagnosis not present

## 2012-06-16 DIAGNOSIS — R262 Difficulty in walking, not elsewhere classified: Secondary | ICD-10-CM | POA: Diagnosis not present

## 2012-06-16 DIAGNOSIS — R279 Unspecified lack of coordination: Secondary | ICD-10-CM | POA: Diagnosis not present

## 2012-06-16 DIAGNOSIS — Z9181 History of falling: Secondary | ICD-10-CM | POA: Diagnosis not present

## 2012-06-17 DIAGNOSIS — R269 Unspecified abnormalities of gait and mobility: Secondary | ICD-10-CM | POA: Diagnosis not present

## 2012-06-17 DIAGNOSIS — Z9181 History of falling: Secondary | ICD-10-CM | POA: Diagnosis not present

## 2012-06-17 DIAGNOSIS — R262 Difficulty in walking, not elsewhere classified: Secondary | ICD-10-CM | POA: Diagnosis not present

## 2012-06-17 DIAGNOSIS — R279 Unspecified lack of coordination: Secondary | ICD-10-CM | POA: Diagnosis not present

## 2012-06-17 DIAGNOSIS — M6281 Muscle weakness (generalized): Secondary | ICD-10-CM | POA: Diagnosis not present

## 2012-06-18 NOTE — ED Provider Notes (Signed)
Medical screening examination/treatment/procedure(s) were conducted as a shared visit with non-physician practitioner(s) and myself.  I personally evaluated the patient during the encounter.  I saw the patient along with N. Pisciotta and agree with her note, assessment, and plan.  The patient presents with complaints of shortness of breath, mainly with exertion for the past 5 days.  She denies fever, cough, or chest pain.  On exam, the vitals are stable and the patient is afebrile.  The heart exam is unremarkable and the lungs are noted to have slight rales in the bases bilaterally.  There is mild ble edema.  The workup today reveals an elevated bnp with cardiomegaly on chest xray.  I suspect her symptoms are related to chf.  She will be discharged with an increased dose of lasix, return prn if she worsens.    Geoffery Lyons, MD 06/18/12 [phone]

## 2012-06-19 DIAGNOSIS — M6281 Muscle weakness (generalized): Secondary | ICD-10-CM | POA: Diagnosis not present

## 2012-06-19 DIAGNOSIS — R262 Difficulty in walking, not elsewhere classified: Secondary | ICD-10-CM | POA: Diagnosis not present

## 2012-06-19 DIAGNOSIS — Z9181 History of falling: Secondary | ICD-10-CM | POA: Diagnosis not present

## 2012-06-19 DIAGNOSIS — R279 Unspecified lack of coordination: Secondary | ICD-10-CM | POA: Diagnosis not present

## 2012-06-19 DIAGNOSIS — R269 Unspecified abnormalities of gait and mobility: Secondary | ICD-10-CM | POA: Diagnosis not present

## 2012-06-23 DIAGNOSIS — Z9181 History of falling: Secondary | ICD-10-CM | POA: Diagnosis not present

## 2012-06-23 DIAGNOSIS — R269 Unspecified abnormalities of gait and mobility: Secondary | ICD-10-CM | POA: Diagnosis not present

## 2012-06-23 DIAGNOSIS — M6281 Muscle weakness (generalized): Secondary | ICD-10-CM | POA: Diagnosis not present

## 2012-06-23 DIAGNOSIS — R279 Unspecified lack of coordination: Secondary | ICD-10-CM | POA: Diagnosis not present

## 2012-06-23 DIAGNOSIS — R262 Difficulty in walking, not elsewhere classified: Secondary | ICD-10-CM | POA: Diagnosis not present

## 2012-06-24 DIAGNOSIS — R269 Unspecified abnormalities of gait and mobility: Secondary | ICD-10-CM | POA: Diagnosis not present

## 2012-06-24 DIAGNOSIS — M6281 Muscle weakness (generalized): Secondary | ICD-10-CM | POA: Diagnosis not present

## 2012-06-24 DIAGNOSIS — R262 Difficulty in walking, not elsewhere classified: Secondary | ICD-10-CM | POA: Diagnosis not present

## 2012-06-24 DIAGNOSIS — Z9181 History of falling: Secondary | ICD-10-CM | POA: Diagnosis not present

## 2012-06-24 DIAGNOSIS — R279 Unspecified lack of coordination: Secondary | ICD-10-CM | POA: Diagnosis not present

## 2012-06-26 DIAGNOSIS — Z9181 History of falling: Secondary | ICD-10-CM | POA: Diagnosis not present

## 2012-06-26 DIAGNOSIS — M6281 Muscle weakness (generalized): Secondary | ICD-10-CM | POA: Diagnosis not present

## 2012-06-26 DIAGNOSIS — R279 Unspecified lack of coordination: Secondary | ICD-10-CM | POA: Diagnosis not present

## 2012-06-26 DIAGNOSIS — R262 Difficulty in walking, not elsewhere classified: Secondary | ICD-10-CM | POA: Diagnosis not present

## 2012-06-26 DIAGNOSIS — R269 Unspecified abnormalities of gait and mobility: Secondary | ICD-10-CM | POA: Diagnosis not present

## 2012-07-01 DIAGNOSIS — I5030 Unspecified diastolic (congestive) heart failure: Secondary | ICD-10-CM | POA: Diagnosis not present

## 2012-07-01 DIAGNOSIS — Z79899 Other long term (current) drug therapy: Secondary | ICD-10-CM | POA: Diagnosis not present

## 2012-07-01 DIAGNOSIS — I10 Essential (primary) hypertension: Secondary | ICD-10-CM | POA: Diagnosis not present

## 2012-07-01 DIAGNOSIS — R609 Edema, unspecified: Secondary | ICD-10-CM | POA: Diagnosis not present

## 2012-07-02 DIAGNOSIS — R262 Difficulty in walking, not elsewhere classified: Secondary | ICD-10-CM | POA: Diagnosis not present

## 2012-07-02 DIAGNOSIS — Z9181 History of falling: Secondary | ICD-10-CM | POA: Diagnosis not present

## 2012-07-02 DIAGNOSIS — R269 Unspecified abnormalities of gait and mobility: Secondary | ICD-10-CM | POA: Diagnosis not present

## 2012-07-02 DIAGNOSIS — M6281 Muscle weakness (generalized): Secondary | ICD-10-CM | POA: Diagnosis not present

## 2012-07-02 DIAGNOSIS — R279 Unspecified lack of coordination: Secondary | ICD-10-CM | POA: Diagnosis not present

## 2012-07-03 DIAGNOSIS — Z9181 History of falling: Secondary | ICD-10-CM | POA: Diagnosis not present

## 2012-07-03 DIAGNOSIS — R262 Difficulty in walking, not elsewhere classified: Secondary | ICD-10-CM | POA: Diagnosis not present

## 2012-07-03 DIAGNOSIS — R279 Unspecified lack of coordination: Secondary | ICD-10-CM | POA: Diagnosis not present

## 2012-07-03 DIAGNOSIS — M6281 Muscle weakness (generalized): Secondary | ICD-10-CM | POA: Diagnosis not present

## 2012-07-03 DIAGNOSIS — R269 Unspecified abnormalities of gait and mobility: Secondary | ICD-10-CM | POA: Diagnosis not present

## 2012-07-04 DIAGNOSIS — R279 Unspecified lack of coordination: Secondary | ICD-10-CM | POA: Diagnosis not present

## 2012-07-04 DIAGNOSIS — M6281 Muscle weakness (generalized): Secondary | ICD-10-CM | POA: Diagnosis not present

## 2012-07-04 DIAGNOSIS — R262 Difficulty in walking, not elsewhere classified: Secondary | ICD-10-CM | POA: Diagnosis not present

## 2012-07-04 DIAGNOSIS — Z9181 History of falling: Secondary | ICD-10-CM | POA: Diagnosis not present

## 2012-07-04 DIAGNOSIS — R269 Unspecified abnormalities of gait and mobility: Secondary | ICD-10-CM | POA: Diagnosis not present

## 2012-07-08 DIAGNOSIS — R279 Unspecified lack of coordination: Secondary | ICD-10-CM | POA: Diagnosis not present

## 2012-07-08 DIAGNOSIS — R269 Unspecified abnormalities of gait and mobility: Secondary | ICD-10-CM | POA: Diagnosis not present

## 2012-07-08 DIAGNOSIS — Z9181 History of falling: Secondary | ICD-10-CM | POA: Diagnosis not present

## 2012-07-08 DIAGNOSIS — R262 Difficulty in walking, not elsewhere classified: Secondary | ICD-10-CM | POA: Diagnosis not present

## 2012-07-08 DIAGNOSIS — M6281 Muscle weakness (generalized): Secondary | ICD-10-CM | POA: Diagnosis not present

## 2012-07-09 DIAGNOSIS — R269 Unspecified abnormalities of gait and mobility: Secondary | ICD-10-CM | POA: Diagnosis not present

## 2012-07-09 DIAGNOSIS — Z9181 History of falling: Secondary | ICD-10-CM | POA: Diagnosis not present

## 2012-07-09 DIAGNOSIS — M6281 Muscle weakness (generalized): Secondary | ICD-10-CM | POA: Diagnosis not present

## 2012-07-09 DIAGNOSIS — R279 Unspecified lack of coordination: Secondary | ICD-10-CM | POA: Diagnosis not present

## 2012-07-09 DIAGNOSIS — R262 Difficulty in walking, not elsewhere classified: Secondary | ICD-10-CM | POA: Diagnosis not present

## 2012-07-11 DIAGNOSIS — R279 Unspecified lack of coordination: Secondary | ICD-10-CM | POA: Diagnosis not present

## 2012-07-11 DIAGNOSIS — M6281 Muscle weakness (generalized): Secondary | ICD-10-CM | POA: Diagnosis not present

## 2012-07-11 DIAGNOSIS — R262 Difficulty in walking, not elsewhere classified: Secondary | ICD-10-CM | POA: Diagnosis not present

## 2012-07-11 DIAGNOSIS — Z9181 History of falling: Secondary | ICD-10-CM | POA: Diagnosis not present

## 2012-07-11 DIAGNOSIS — R269 Unspecified abnormalities of gait and mobility: Secondary | ICD-10-CM | POA: Diagnosis not present

## 2012-07-15 DIAGNOSIS — R269 Unspecified abnormalities of gait and mobility: Secondary | ICD-10-CM | POA: Diagnosis not present

## 2012-07-15 DIAGNOSIS — Z9181 History of falling: Secondary | ICD-10-CM | POA: Diagnosis not present

## 2012-07-15 DIAGNOSIS — R262 Difficulty in walking, not elsewhere classified: Secondary | ICD-10-CM | POA: Diagnosis not present

## 2012-07-15 DIAGNOSIS — M6281 Muscle weakness (generalized): Secondary | ICD-10-CM | POA: Diagnosis not present

## 2012-07-15 DIAGNOSIS — R279 Unspecified lack of coordination: Secondary | ICD-10-CM | POA: Diagnosis not present

## 2012-07-16 DIAGNOSIS — R279 Unspecified lack of coordination: Secondary | ICD-10-CM | POA: Diagnosis not present

## 2012-07-16 DIAGNOSIS — M6281 Muscle weakness (generalized): Secondary | ICD-10-CM | POA: Diagnosis not present

## 2012-07-16 DIAGNOSIS — R269 Unspecified abnormalities of gait and mobility: Secondary | ICD-10-CM | POA: Diagnosis not present

## 2012-07-16 DIAGNOSIS — Z9181 History of falling: Secondary | ICD-10-CM | POA: Diagnosis not present

## 2012-07-16 DIAGNOSIS — R262 Difficulty in walking, not elsewhere classified: Secondary | ICD-10-CM | POA: Diagnosis not present

## 2012-07-17 DIAGNOSIS — M6281 Muscle weakness (generalized): Secondary | ICD-10-CM | POA: Diagnosis not present

## 2012-07-17 DIAGNOSIS — R269 Unspecified abnormalities of gait and mobility: Secondary | ICD-10-CM | POA: Diagnosis not present

## 2012-07-17 DIAGNOSIS — Z9181 History of falling: Secondary | ICD-10-CM | POA: Diagnosis not present

## 2012-07-17 DIAGNOSIS — R279 Unspecified lack of coordination: Secondary | ICD-10-CM | POA: Diagnosis not present

## 2012-07-17 DIAGNOSIS — R262 Difficulty in walking, not elsewhere classified: Secondary | ICD-10-CM | POA: Diagnosis not present

## 2012-07-28 DIAGNOSIS — M6281 Muscle weakness (generalized): Secondary | ICD-10-CM | POA: Diagnosis not present

## 2012-07-28 DIAGNOSIS — R262 Difficulty in walking, not elsewhere classified: Secondary | ICD-10-CM | POA: Diagnosis not present

## 2012-07-28 DIAGNOSIS — R279 Unspecified lack of coordination: Secondary | ICD-10-CM | POA: Diagnosis not present

## 2012-07-28 DIAGNOSIS — Z9181 History of falling: Secondary | ICD-10-CM | POA: Diagnosis not present

## 2012-07-28 DIAGNOSIS — R269 Unspecified abnormalities of gait and mobility: Secondary | ICD-10-CM | POA: Diagnosis not present

## 2012-07-30 DIAGNOSIS — R279 Unspecified lack of coordination: Secondary | ICD-10-CM | POA: Diagnosis not present

## 2012-07-30 DIAGNOSIS — R262 Difficulty in walking, not elsewhere classified: Secondary | ICD-10-CM | POA: Diagnosis not present

## 2012-07-30 DIAGNOSIS — M6281 Muscle weakness (generalized): Secondary | ICD-10-CM | POA: Diagnosis not present

## 2012-07-30 DIAGNOSIS — R269 Unspecified abnormalities of gait and mobility: Secondary | ICD-10-CM | POA: Diagnosis not present

## 2012-07-30 DIAGNOSIS — Z9181 History of falling: Secondary | ICD-10-CM | POA: Diagnosis not present

## 2012-08-01 DIAGNOSIS — Z9181 History of falling: Secondary | ICD-10-CM | POA: Diagnosis not present

## 2012-08-01 DIAGNOSIS — R262 Difficulty in walking, not elsewhere classified: Secondary | ICD-10-CM | POA: Diagnosis not present

## 2012-08-01 DIAGNOSIS — R279 Unspecified lack of coordination: Secondary | ICD-10-CM | POA: Diagnosis not present

## 2012-08-01 DIAGNOSIS — R269 Unspecified abnormalities of gait and mobility: Secondary | ICD-10-CM | POA: Diagnosis not present

## 2012-08-01 DIAGNOSIS — M6281 Muscle weakness (generalized): Secondary | ICD-10-CM | POA: Diagnosis not present

## 2012-08-04 DIAGNOSIS — M6281 Muscle weakness (generalized): Secondary | ICD-10-CM | POA: Diagnosis not present

## 2012-08-04 DIAGNOSIS — R279 Unspecified lack of coordination: Secondary | ICD-10-CM | POA: Diagnosis not present

## 2012-08-04 DIAGNOSIS — R269 Unspecified abnormalities of gait and mobility: Secondary | ICD-10-CM | POA: Diagnosis not present

## 2012-08-04 DIAGNOSIS — R262 Difficulty in walking, not elsewhere classified: Secondary | ICD-10-CM | POA: Diagnosis not present

## 2012-08-04 DIAGNOSIS — Z9181 History of falling: Secondary | ICD-10-CM | POA: Diagnosis not present

## 2012-08-06 DIAGNOSIS — R262 Difficulty in walking, not elsewhere classified: Secondary | ICD-10-CM | POA: Diagnosis not present

## 2012-08-06 DIAGNOSIS — Z9181 History of falling: Secondary | ICD-10-CM | POA: Diagnosis not present

## 2012-08-06 DIAGNOSIS — R269 Unspecified abnormalities of gait and mobility: Secondary | ICD-10-CM | POA: Diagnosis not present

## 2012-08-06 DIAGNOSIS — M6281 Muscle weakness (generalized): Secondary | ICD-10-CM | POA: Diagnosis not present

## 2012-08-06 DIAGNOSIS — R279 Unspecified lack of coordination: Secondary | ICD-10-CM | POA: Diagnosis not present

## 2012-08-07 DIAGNOSIS — R262 Difficulty in walking, not elsewhere classified: Secondary | ICD-10-CM | POA: Diagnosis not present

## 2012-08-07 DIAGNOSIS — R279 Unspecified lack of coordination: Secondary | ICD-10-CM | POA: Diagnosis not present

## 2012-08-07 DIAGNOSIS — Z9181 History of falling: Secondary | ICD-10-CM | POA: Diagnosis not present

## 2012-08-07 DIAGNOSIS — R269 Unspecified abnormalities of gait and mobility: Secondary | ICD-10-CM | POA: Diagnosis not present

## 2012-08-07 DIAGNOSIS — M6281 Muscle weakness (generalized): Secondary | ICD-10-CM | POA: Diagnosis not present

## 2012-08-13 DIAGNOSIS — R269 Unspecified abnormalities of gait and mobility: Secondary | ICD-10-CM | POA: Diagnosis not present

## 2012-08-13 DIAGNOSIS — M6281 Muscle weakness (generalized): Secondary | ICD-10-CM | POA: Diagnosis not present

## 2012-08-13 DIAGNOSIS — R262 Difficulty in walking, not elsewhere classified: Secondary | ICD-10-CM | POA: Diagnosis not present

## 2012-08-13 DIAGNOSIS — Z9181 History of falling: Secondary | ICD-10-CM | POA: Diagnosis not present

## 2012-08-13 DIAGNOSIS — R279 Unspecified lack of coordination: Secondary | ICD-10-CM | POA: Diagnosis not present

## 2012-08-14 DIAGNOSIS — M6281 Muscle weakness (generalized): Secondary | ICD-10-CM | POA: Diagnosis not present

## 2012-08-14 DIAGNOSIS — Z9181 History of falling: Secondary | ICD-10-CM | POA: Diagnosis not present

## 2012-08-14 DIAGNOSIS — R262 Difficulty in walking, not elsewhere classified: Secondary | ICD-10-CM | POA: Diagnosis not present

## 2012-08-14 DIAGNOSIS — R279 Unspecified lack of coordination: Secondary | ICD-10-CM | POA: Diagnosis not present

## 2012-08-14 DIAGNOSIS — R269 Unspecified abnormalities of gait and mobility: Secondary | ICD-10-CM | POA: Diagnosis not present

## 2012-08-15 DIAGNOSIS — R269 Unspecified abnormalities of gait and mobility: Secondary | ICD-10-CM | POA: Diagnosis not present

## 2012-08-15 DIAGNOSIS — Z9181 History of falling: Secondary | ICD-10-CM | POA: Diagnosis not present

## 2012-08-15 DIAGNOSIS — R262 Difficulty in walking, not elsewhere classified: Secondary | ICD-10-CM | POA: Diagnosis not present

## 2012-08-15 DIAGNOSIS — M6281 Muscle weakness (generalized): Secondary | ICD-10-CM | POA: Diagnosis not present

## 2012-08-15 DIAGNOSIS — R279 Unspecified lack of coordination: Secondary | ICD-10-CM | POA: Diagnosis not present

## 2012-08-16 DIAGNOSIS — Z23 Encounter for immunization: Secondary | ICD-10-CM | POA: Diagnosis not present

## 2012-08-18 DIAGNOSIS — R262 Difficulty in walking, not elsewhere classified: Secondary | ICD-10-CM | POA: Diagnosis not present

## 2012-08-18 DIAGNOSIS — Z9181 History of falling: Secondary | ICD-10-CM | POA: Diagnosis not present

## 2012-08-18 DIAGNOSIS — M6281 Muscle weakness (generalized): Secondary | ICD-10-CM | POA: Diagnosis not present

## 2012-08-18 DIAGNOSIS — R269 Unspecified abnormalities of gait and mobility: Secondary | ICD-10-CM | POA: Diagnosis not present

## 2012-08-18 DIAGNOSIS — R279 Unspecified lack of coordination: Secondary | ICD-10-CM | POA: Diagnosis not present

## 2012-08-20 DIAGNOSIS — R0602 Shortness of breath: Secondary | ICD-10-CM | POA: Diagnosis not present

## 2012-08-20 DIAGNOSIS — R5381 Other malaise: Secondary | ICD-10-CM | POA: Diagnosis not present

## 2012-08-20 DIAGNOSIS — R5383 Other fatigue: Secondary | ICD-10-CM | POA: Diagnosis not present

## 2012-08-21 DIAGNOSIS — R269 Unspecified abnormalities of gait and mobility: Secondary | ICD-10-CM | POA: Diagnosis not present

## 2012-08-21 DIAGNOSIS — R279 Unspecified lack of coordination: Secondary | ICD-10-CM | POA: Diagnosis not present

## 2012-08-21 DIAGNOSIS — R262 Difficulty in walking, not elsewhere classified: Secondary | ICD-10-CM | POA: Diagnosis not present

## 2012-08-21 DIAGNOSIS — M6281 Muscle weakness (generalized): Secondary | ICD-10-CM | POA: Diagnosis not present

## 2012-08-21 DIAGNOSIS — Z9181 History of falling: Secondary | ICD-10-CM | POA: Diagnosis not present

## 2012-08-25 DIAGNOSIS — M6281 Muscle weakness (generalized): Secondary | ICD-10-CM | POA: Diagnosis not present

## 2012-08-25 DIAGNOSIS — R279 Unspecified lack of coordination: Secondary | ICD-10-CM | POA: Diagnosis not present

## 2012-08-25 DIAGNOSIS — Z9181 History of falling: Secondary | ICD-10-CM | POA: Diagnosis not present

## 2012-08-25 DIAGNOSIS — R269 Unspecified abnormalities of gait and mobility: Secondary | ICD-10-CM | POA: Diagnosis not present

## 2012-08-25 DIAGNOSIS — R262 Difficulty in walking, not elsewhere classified: Secondary | ICD-10-CM | POA: Diagnosis not present

## 2012-08-27 DIAGNOSIS — I5030 Unspecified diastolic (congestive) heart failure: Secondary | ICD-10-CM | POA: Diagnosis not present

## 2012-08-27 DIAGNOSIS — R609 Edema, unspecified: Secondary | ICD-10-CM | POA: Diagnosis not present

## 2012-08-27 DIAGNOSIS — I10 Essential (primary) hypertension: Secondary | ICD-10-CM | POA: Diagnosis not present

## 2012-08-28 DIAGNOSIS — R269 Unspecified abnormalities of gait and mobility: Secondary | ICD-10-CM | POA: Diagnosis not present

## 2012-08-28 DIAGNOSIS — R262 Difficulty in walking, not elsewhere classified: Secondary | ICD-10-CM | POA: Diagnosis not present

## 2012-08-28 DIAGNOSIS — M6281 Muscle weakness (generalized): Secondary | ICD-10-CM | POA: Diagnosis not present

## 2012-08-28 DIAGNOSIS — R279 Unspecified lack of coordination: Secondary | ICD-10-CM | POA: Diagnosis not present

## 2012-08-28 DIAGNOSIS — Z9181 History of falling: Secondary | ICD-10-CM | POA: Diagnosis not present

## 2012-09-06 DIAGNOSIS — J019 Acute sinusitis, unspecified: Secondary | ICD-10-CM | POA: Diagnosis not present

## 2012-09-10 DIAGNOSIS — I10 Essential (primary) hypertension: Secondary | ICD-10-CM | POA: Diagnosis not present

## 2012-09-10 DIAGNOSIS — I5030 Unspecified diastolic (congestive) heart failure: Secondary | ICD-10-CM | POA: Diagnosis not present

## 2012-09-10 DIAGNOSIS — R609 Edema, unspecified: Secondary | ICD-10-CM | POA: Diagnosis not present

## 2012-09-11 DIAGNOSIS — H26499 Other secondary cataract, unspecified eye: Secondary | ICD-10-CM | POA: Diagnosis not present

## 2012-09-16 DIAGNOSIS — E039 Hypothyroidism, unspecified: Secondary | ICD-10-CM | POA: Diagnosis not present

## 2012-09-16 DIAGNOSIS — E559 Vitamin D deficiency, unspecified: Secondary | ICD-10-CM | POA: Diagnosis not present

## 2012-09-16 DIAGNOSIS — F411 Generalized anxiety disorder: Secondary | ICD-10-CM | POA: Diagnosis not present

## 2012-09-16 DIAGNOSIS — J019 Acute sinusitis, unspecified: Secondary | ICD-10-CM | POA: Diagnosis not present

## 2012-09-24 DIAGNOSIS — I781 Nevus, non-neoplastic: Secondary | ICD-10-CM | POA: Diagnosis not present

## 2012-09-24 DIAGNOSIS — Z85828 Personal history of other malignant neoplasm of skin: Secondary | ICD-10-CM | POA: Diagnosis not present

## 2012-09-24 DIAGNOSIS — L570 Actinic keratosis: Secondary | ICD-10-CM | POA: Diagnosis not present

## 2012-09-24 DIAGNOSIS — D235 Other benign neoplasm of skin of trunk: Secondary | ICD-10-CM | POA: Diagnosis not present

## 2013-01-01 DIAGNOSIS — I359 Nonrheumatic aortic valve disorder, unspecified: Secondary | ICD-10-CM | POA: Diagnosis not present

## 2013-01-01 DIAGNOSIS — I5030 Unspecified diastolic (congestive) heart failure: Secondary | ICD-10-CM | POA: Diagnosis not present

## 2013-01-01 DIAGNOSIS — Z79899 Other long term (current) drug therapy: Secondary | ICD-10-CM | POA: Diagnosis not present

## 2013-01-01 DIAGNOSIS — R609 Edema, unspecified: Secondary | ICD-10-CM | POA: Diagnosis not present

## 2013-01-28 DIAGNOSIS — M79609 Pain in unspecified limb: Secondary | ICD-10-CM | POA: Diagnosis not present

## 2013-02-03 DIAGNOSIS — J479 Bronchiectasis, uncomplicated: Secondary | ICD-10-CM | POA: Diagnosis not present

## 2013-02-03 DIAGNOSIS — R05 Cough: Secondary | ICD-10-CM | POA: Diagnosis not present

## 2013-03-02 DIAGNOSIS — I359 Nonrheumatic aortic valve disorder, unspecified: Secondary | ICD-10-CM | POA: Diagnosis not present

## 2013-03-18 DIAGNOSIS — H353190 Nonexudative age-related macular degeneration, unspecified eye, stage unspecified: Secondary | ICD-10-CM | POA: Diagnosis not present

## 2013-03-23 DIAGNOSIS — R5383 Other fatigue: Secondary | ICD-10-CM | POA: Diagnosis not present

## 2013-03-23 DIAGNOSIS — E039 Hypothyroidism, unspecified: Secondary | ICD-10-CM | POA: Diagnosis not present

## 2013-03-23 DIAGNOSIS — Z0000 Encounter for general adult medical examination without abnormal findings: Secondary | ICD-10-CM | POA: Diagnosis not present

## 2013-03-23 DIAGNOSIS — R5381 Other malaise: Secondary | ICD-10-CM | POA: Diagnosis not present

## 2013-03-23 DIAGNOSIS — I509 Heart failure, unspecified: Secondary | ICD-10-CM | POA: Diagnosis not present

## 2013-03-26 DIAGNOSIS — R5381 Other malaise: Secondary | ICD-10-CM | POA: Diagnosis not present

## 2013-03-26 DIAGNOSIS — I509 Heart failure, unspecified: Secondary | ICD-10-CM | POA: Diagnosis not present

## 2013-03-26 DIAGNOSIS — J309 Allergic rhinitis, unspecified: Secondary | ICD-10-CM | POA: Diagnosis not present

## 2013-03-26 DIAGNOSIS — R5383 Other fatigue: Secondary | ICD-10-CM | POA: Diagnosis not present

## 2013-03-26 DIAGNOSIS — E039 Hypothyroidism, unspecified: Secondary | ICD-10-CM | POA: Diagnosis not present

## 2013-04-20 DIAGNOSIS — R5383 Other fatigue: Secondary | ICD-10-CM | POA: Diagnosis not present

## 2013-04-20 DIAGNOSIS — R5381 Other malaise: Secondary | ICD-10-CM | POA: Diagnosis not present

## 2013-04-28 DIAGNOSIS — H353190 Nonexudative age-related macular degeneration, unspecified eye, stage unspecified: Secondary | ICD-10-CM | POA: Diagnosis not present

## 2013-04-30 DIAGNOSIS — H35039 Hypertensive retinopathy, unspecified eye: Secondary | ICD-10-CM | POA: Diagnosis not present

## 2013-04-30 DIAGNOSIS — H353290 Exudative age-related macular degeneration, unspecified eye, stage unspecified: Secondary | ICD-10-CM | POA: Diagnosis not present

## 2013-04-30 DIAGNOSIS — H35059 Retinal neovascularization, unspecified, unspecified eye: Secondary | ICD-10-CM | POA: Diagnosis not present

## 2013-04-30 DIAGNOSIS — H43819 Vitreous degeneration, unspecified eye: Secondary | ICD-10-CM | POA: Diagnosis not present

## 2013-05-19 DIAGNOSIS — Z85828 Personal history of other malignant neoplasm of skin: Secondary | ICD-10-CM | POA: Diagnosis not present

## 2013-05-19 DIAGNOSIS — D235 Other benign neoplasm of skin of trunk: Secondary | ICD-10-CM | POA: Diagnosis not present

## 2013-05-19 DIAGNOSIS — L570 Actinic keratosis: Secondary | ICD-10-CM | POA: Diagnosis not present

## 2013-05-19 DIAGNOSIS — C44319 Basal cell carcinoma of skin of other parts of face: Secondary | ICD-10-CM | POA: Diagnosis not present

## 2013-06-01 DIAGNOSIS — H35059 Retinal neovascularization, unspecified, unspecified eye: Secondary | ICD-10-CM | POA: Diagnosis not present

## 2013-06-01 DIAGNOSIS — H353290 Exudative age-related macular degeneration, unspecified eye, stage unspecified: Secondary | ICD-10-CM | POA: Diagnosis not present

## 2013-06-08 DIAGNOSIS — H353290 Exudative age-related macular degeneration, unspecified eye, stage unspecified: Secondary | ICD-10-CM | POA: Diagnosis not present

## 2013-07-02 DIAGNOSIS — Z79899 Other long term (current) drug therapy: Secondary | ICD-10-CM | POA: Diagnosis not present

## 2013-07-02 DIAGNOSIS — I359 Nonrheumatic aortic valve disorder, unspecified: Secondary | ICD-10-CM | POA: Diagnosis not present

## 2013-07-02 DIAGNOSIS — I5030 Unspecified diastolic (congestive) heart failure: Secondary | ICD-10-CM | POA: Diagnosis not present

## 2013-07-02 DIAGNOSIS — I10 Essential (primary) hypertension: Secondary | ICD-10-CM | POA: Diagnosis not present

## 2013-07-06 DIAGNOSIS — H35059 Retinal neovascularization, unspecified, unspecified eye: Secondary | ICD-10-CM | POA: Diagnosis not present

## 2013-07-06 DIAGNOSIS — H353290 Exudative age-related macular degeneration, unspecified eye, stage unspecified: Secondary | ICD-10-CM | POA: Diagnosis not present

## 2013-07-28 DIAGNOSIS — Z85828 Personal history of other malignant neoplasm of skin: Secondary | ICD-10-CM | POA: Diagnosis not present

## 2013-07-28 DIAGNOSIS — D0439 Carcinoma in situ of skin of other parts of face: Secondary | ICD-10-CM | POA: Diagnosis not present

## 2013-07-28 DIAGNOSIS — D235 Other benign neoplasm of skin of trunk: Secondary | ICD-10-CM | POA: Diagnosis not present

## 2013-07-28 DIAGNOSIS — L820 Inflamed seborrheic keratosis: Secondary | ICD-10-CM | POA: Diagnosis not present

## 2013-08-03 DIAGNOSIS — H35059 Retinal neovascularization, unspecified, unspecified eye: Secondary | ICD-10-CM | POA: Diagnosis not present

## 2013-08-03 DIAGNOSIS — H353290 Exudative age-related macular degeneration, unspecified eye, stage unspecified: Secondary | ICD-10-CM | POA: Diagnosis not present

## 2013-08-28 DIAGNOSIS — R1310 Dysphagia, unspecified: Secondary | ICD-10-CM | POA: Diagnosis not present

## 2013-08-31 ENCOUNTER — Other Ambulatory Visit: Payer: Self-pay | Admitting: Gastroenterology

## 2013-08-31 DIAGNOSIS — K208 Other esophagitis without bleeding: Secondary | ICD-10-CM

## 2013-09-10 DIAGNOSIS — H35039 Hypertensive retinopathy, unspecified eye: Secondary | ICD-10-CM | POA: Diagnosis not present

## 2013-09-10 DIAGNOSIS — H353290 Exudative age-related macular degeneration, unspecified eye, stage unspecified: Secondary | ICD-10-CM | POA: Diagnosis not present

## 2013-09-14 ENCOUNTER — Ambulatory Visit
Admission: RE | Admit: 2013-09-14 | Discharge: 2013-09-14 | Disposition: A | Discharge status: Home / Self Care | Payer: Medicare Other | Source: Ambulatory Visit | Attending: Gastroenterology | Admitting: Gastroenterology

## 2013-09-14 DIAGNOSIS — K449 Diaphragmatic hernia without obstruction or gangrene: Secondary | ICD-10-CM | POA: Diagnosis not present

## 2013-09-15 DIAGNOSIS — H26499 Other secondary cataract, unspecified eye: Secondary | ICD-10-CM | POA: Diagnosis not present

## 2013-10-23 DIAGNOSIS — H353290 Exudative age-related macular degeneration, unspecified eye, stage unspecified: Secondary | ICD-10-CM | POA: Diagnosis not present

## 2013-10-30 DIAGNOSIS — F329 Major depressive disorder, single episode, unspecified: Secondary | ICD-10-CM | POA: Diagnosis not present

## 2013-10-30 DIAGNOSIS — R7309 Other abnormal glucose: Secondary | ICD-10-CM | POA: Diagnosis not present

## 2013-10-30 DIAGNOSIS — E039 Hypothyroidism, unspecified: Secondary | ICD-10-CM | POA: Diagnosis not present

## 2013-10-30 DIAGNOSIS — I10 Essential (primary) hypertension: Secondary | ICD-10-CM | POA: Diagnosis not present

## 2013-12-02 DIAGNOSIS — M25539 Pain in unspecified wrist: Secondary | ICD-10-CM | POA: Diagnosis not present

## 2013-12-03 DIAGNOSIS — M19049 Primary osteoarthritis, unspecified hand: Secondary | ICD-10-CM | POA: Diagnosis not present

## 2013-12-03 DIAGNOSIS — E559 Vitamin D deficiency, unspecified: Secondary | ICD-10-CM | POA: Diagnosis not present

## 2013-12-03 DIAGNOSIS — I509 Heart failure, unspecified: Secondary | ICD-10-CM | POA: Diagnosis not present

## 2013-12-03 DIAGNOSIS — H3530 Unspecified macular degeneration: Secondary | ICD-10-CM | POA: Diagnosis not present

## 2013-12-03 DIAGNOSIS — Z602 Problems related to living alone: Secondary | ICD-10-CM | POA: Diagnosis not present

## 2013-12-03 DIAGNOSIS — F3289 Other specified depressive episodes: Secondary | ICD-10-CM | POA: Diagnosis not present

## 2013-12-03 DIAGNOSIS — F329 Major depressive disorder, single episode, unspecified: Secondary | ICD-10-CM | POA: Diagnosis not present

## 2013-12-03 DIAGNOSIS — E039 Hypothyroidism, unspecified: Secondary | ICD-10-CM | POA: Diagnosis not present

## 2013-12-03 DIAGNOSIS — M25539 Pain in unspecified wrist: Secondary | ICD-10-CM | POA: Diagnosis not present

## 2013-12-03 DIAGNOSIS — H543 Unqualified visual loss, both eyes: Secondary | ICD-10-CM | POA: Diagnosis not present

## 2013-12-03 DIAGNOSIS — I10 Essential (primary) hypertension: Secondary | ICD-10-CM | POA: Diagnosis not present

## 2013-12-08 DIAGNOSIS — F3289 Other specified depressive episodes: Secondary | ICD-10-CM | POA: Diagnosis not present

## 2013-12-08 DIAGNOSIS — I10 Essential (primary) hypertension: Secondary | ICD-10-CM | POA: Diagnosis not present

## 2013-12-08 DIAGNOSIS — M25539 Pain in unspecified wrist: Secondary | ICD-10-CM | POA: Diagnosis not present

## 2013-12-08 DIAGNOSIS — F329 Major depressive disorder, single episode, unspecified: Secondary | ICD-10-CM | POA: Diagnosis not present

## 2013-12-08 DIAGNOSIS — I509 Heart failure, unspecified: Secondary | ICD-10-CM | POA: Diagnosis not present

## 2013-12-08 DIAGNOSIS — H3530 Unspecified macular degeneration: Secondary | ICD-10-CM | POA: Diagnosis not present

## 2013-12-08 DIAGNOSIS — H543 Unqualified visual loss, both eyes: Secondary | ICD-10-CM | POA: Diagnosis not present

## 2013-12-10 DIAGNOSIS — M25539 Pain in unspecified wrist: Secondary | ICD-10-CM | POA: Diagnosis not present

## 2013-12-10 DIAGNOSIS — F329 Major depressive disorder, single episode, unspecified: Secondary | ICD-10-CM | POA: Diagnosis not present

## 2013-12-10 DIAGNOSIS — H3530 Unspecified macular degeneration: Secondary | ICD-10-CM | POA: Diagnosis not present

## 2013-12-10 DIAGNOSIS — F3289 Other specified depressive episodes: Secondary | ICD-10-CM | POA: Diagnosis not present

## 2013-12-10 DIAGNOSIS — I509 Heart failure, unspecified: Secondary | ICD-10-CM | POA: Diagnosis not present

## 2013-12-10 DIAGNOSIS — I10 Essential (primary) hypertension: Secondary | ICD-10-CM | POA: Diagnosis not present

## 2013-12-10 DIAGNOSIS — H543 Unqualified visual loss, both eyes: Secondary | ICD-10-CM | POA: Diagnosis not present

## 2013-12-15 DIAGNOSIS — H26499 Other secondary cataract, unspecified eye: Secondary | ICD-10-CM | POA: Diagnosis not present

## 2013-12-15 DIAGNOSIS — H353190 Nonexudative age-related macular degeneration, unspecified eye, stage unspecified: Secondary | ICD-10-CM | POA: Diagnosis not present

## 2013-12-16 DIAGNOSIS — H3530 Unspecified macular degeneration: Secondary | ICD-10-CM | POA: Diagnosis not present

## 2013-12-16 DIAGNOSIS — I10 Essential (primary) hypertension: Secondary | ICD-10-CM | POA: Diagnosis not present

## 2013-12-16 DIAGNOSIS — F329 Major depressive disorder, single episode, unspecified: Secondary | ICD-10-CM | POA: Diagnosis not present

## 2013-12-16 DIAGNOSIS — H543 Unqualified visual loss, both eyes: Secondary | ICD-10-CM | POA: Diagnosis not present

## 2013-12-16 DIAGNOSIS — I509 Heart failure, unspecified: Secondary | ICD-10-CM | POA: Diagnosis not present

## 2013-12-16 DIAGNOSIS — M25539 Pain in unspecified wrist: Secondary | ICD-10-CM | POA: Diagnosis not present

## 2013-12-16 DIAGNOSIS — F3289 Other specified depressive episodes: Secondary | ICD-10-CM | POA: Diagnosis not present

## 2013-12-18 DIAGNOSIS — I10 Essential (primary) hypertension: Secondary | ICD-10-CM | POA: Diagnosis not present

## 2013-12-18 DIAGNOSIS — H353290 Exudative age-related macular degeneration, unspecified eye, stage unspecified: Secondary | ICD-10-CM | POA: Diagnosis not present

## 2013-12-18 DIAGNOSIS — H543 Unqualified visual loss, both eyes: Secondary | ICD-10-CM | POA: Diagnosis not present

## 2013-12-18 DIAGNOSIS — M25539 Pain in unspecified wrist: Secondary | ICD-10-CM | POA: Diagnosis not present

## 2013-12-18 DIAGNOSIS — F329 Major depressive disorder, single episode, unspecified: Secondary | ICD-10-CM | POA: Diagnosis not present

## 2013-12-18 DIAGNOSIS — F3289 Other specified depressive episodes: Secondary | ICD-10-CM | POA: Diagnosis not present

## 2013-12-18 DIAGNOSIS — I509 Heart failure, unspecified: Secondary | ICD-10-CM | POA: Diagnosis not present

## 2013-12-18 DIAGNOSIS — H35059 Retinal neovascularization, unspecified, unspecified eye: Secondary | ICD-10-CM | POA: Diagnosis not present

## 2013-12-18 DIAGNOSIS — H3530 Unspecified macular degeneration: Secondary | ICD-10-CM | POA: Diagnosis not present

## 2013-12-22 ENCOUNTER — Encounter: Payer: Self-pay | Admitting: General Surgery

## 2013-12-22 DIAGNOSIS — I10 Essential (primary) hypertension: Secondary | ICD-10-CM

## 2013-12-22 DIAGNOSIS — M25539 Pain in unspecified wrist: Secondary | ICD-10-CM | POA: Diagnosis not present

## 2013-12-22 DIAGNOSIS — Z79899 Other long term (current) drug therapy: Secondary | ICD-10-CM

## 2013-12-22 DIAGNOSIS — I5030 Unspecified diastolic (congestive) heart failure: Secondary | ICD-10-CM

## 2013-12-22 DIAGNOSIS — F329 Major depressive disorder, single episode, unspecified: Secondary | ICD-10-CM | POA: Diagnosis not present

## 2013-12-22 DIAGNOSIS — H3530 Unspecified macular degeneration: Secondary | ICD-10-CM | POA: Diagnosis not present

## 2013-12-22 DIAGNOSIS — I350 Nonrheumatic aortic (valve) stenosis: Secondary | ICD-10-CM

## 2013-12-22 DIAGNOSIS — H543 Unqualified visual loss, both eyes: Secondary | ICD-10-CM | POA: Diagnosis not present

## 2013-12-22 DIAGNOSIS — F3289 Other specified depressive episodes: Secondary | ICD-10-CM | POA: Diagnosis not present

## 2013-12-22 DIAGNOSIS — I509 Heart failure, unspecified: Secondary | ICD-10-CM | POA: Diagnosis not present

## 2013-12-30 ENCOUNTER — Ambulatory Visit: Payer: Medicare Other | Admitting: Cardiology

## 2013-12-31 DIAGNOSIS — F3289 Other specified depressive episodes: Secondary | ICD-10-CM | POA: Diagnosis not present

## 2013-12-31 DIAGNOSIS — M25539 Pain in unspecified wrist: Secondary | ICD-10-CM | POA: Diagnosis not present

## 2013-12-31 DIAGNOSIS — H3530 Unspecified macular degeneration: Secondary | ICD-10-CM | POA: Diagnosis not present

## 2013-12-31 DIAGNOSIS — H543 Unqualified visual loss, both eyes: Secondary | ICD-10-CM | POA: Diagnosis not present

## 2013-12-31 DIAGNOSIS — I10 Essential (primary) hypertension: Secondary | ICD-10-CM | POA: Diagnosis not present

## 2013-12-31 DIAGNOSIS — I509 Heart failure, unspecified: Secondary | ICD-10-CM | POA: Diagnosis not present

## 2013-12-31 DIAGNOSIS — F329 Major depressive disorder, single episode, unspecified: Secondary | ICD-10-CM | POA: Diagnosis not present

## 2014-01-01 ENCOUNTER — Encounter: Payer: Self-pay | Admitting: Cardiology

## 2014-01-01 ENCOUNTER — Ambulatory Visit (INDEPENDENT_AMBULATORY_CARE_PROVIDER_SITE_OTHER): Payer: Medicare Other | Admitting: Cardiology

## 2014-01-01 VITALS — BP 150/78 | HR 64 | Ht 60.0 in | Wt 179.0 lb

## 2014-01-01 DIAGNOSIS — I10 Essential (primary) hypertension: Secondary | ICD-10-CM | POA: Diagnosis not present

## 2014-01-01 DIAGNOSIS — I5030 Unspecified diastolic (congestive) heart failure: Secondary | ICD-10-CM | POA: Diagnosis not present

## 2014-01-01 DIAGNOSIS — I509 Heart failure, unspecified: Secondary | ICD-10-CM | POA: Diagnosis not present

## 2014-01-01 NOTE — Patient Instructions (Addendum)
The current medical regimen is effective;  continue present plan and medications.  See Dr Radford Pax as scheduled.

## 2014-01-01 NOTE — Progress Notes (Signed)
HPI The patient was added on to my schedule. She thought she had an appointment with Dr. Radford Pax who sees her for management of diastolic heart failure. She also has moderate aortic stenosis.  She wanted to be seen earlier because her blood pressure has been running high. However, there are conflicting data. Manual readings report her blood pressure to be in the 120s. The machine reading at [facility] home reports his systolics in the 010U. She is not sure which is accurate. She's not having any new symptoms. She does get winded when she walks with her walker to the dining hall but this has been chronic and perhaps slightly progressive. She's not having any resting complaints. She doesn't have any PND or orthopnea. She doesn't have any palpitations, presyncope or syncope. He has no weight gain or new edema. He has some chronic edema.  Allergies  Allergen Reactions  . Codeine     Lip Swelling  . Penicillins     Lip swelling    Current Outpatient Prescriptions  Medication Sig Dispense Refill  . Cholecalciferol (VITAMIN D3) 2000 UNITS CHEW Chew 2,000 mg by mouth daily.      Marland Kitchen albuterol (PROVENTIL) (5 MG/ML) 0.5% nebulizer solution Take 2.5 mg by nebulization every 6 (six) hours as needed for wheezing or shortness of breath.      . erythromycin ophthalmic ointment Apply 1 application to eye every 6 (six) hours.      Marland Kitchen escitalopram (LEXAPRO) 10 MG tablet Take 5 mg by mouth daily.      . furosemide (LASIX) 80 MG tablet Take 40 mg by mouth daily.       Marland Kitchen levothyroxine (SYNTHROID, LEVOTHROID) 88 MCG tablet Take 88 mcg by mouth daily.      . Multiple Vitamin (MULTIVITAMIN WITH MINERALS) TABS Take 1 tablet by mouth daily.      . Multiple Vitamins-Minerals (PRESERVISION AREDS PO) Take 1 capsule by mouth daily.      . ondansetron (ZOFRAN-ODT) 4 MG disintegrating tablet Take 4 mg by mouth every 8 (eight) hours as needed for nausea or vomiting.      . pindolol (VISKEN) 5 MG tablet Take 5 mg by mouth  daily.      . potassium chloride (K-DUR) 10 MEQ tablet Take 10 mEq by mouth daily.      . traMADol (ULTRAM) 50 MG tablet Take by mouth. 1-2 tablets every 6 hours as needed for pain.      . vitamin C (ASCORBIC ACID) 500 MG tablet Take 500 mg by mouth daily.       No current facility-administered medications for this visit.    Past Medical History  Diagnosis Date  . Chronic CHF (congestive heart failure)     Preserved EF  . Lower extremity edema     CHRONIC  . Hypothyroidism   . Anxiety and depression   . Hypertension   . Moderate aortic stenosis 05/2012    by ECHO  . Cervical disc disease     W radiculopathy S/P epidural injections  . Vitamin D deficiency   . Breast cancer   . Macular degeneration     Dr Hillis Range Follows for pt.    Past Surgical History  Procedure Laterality Date  . Cararact removal      ROS:   As stated in the HPI and negative for all other systems.  PHYSICAL EXAM BP 150/78  Pulse 64  Ht 5' (1.524 m)  Wt 179 lb (81.194 kg)  BMI 34.96 kg/m2  SpO2 94% GENERAL:  Well appearing HEENT:  Pupils equal round and reactive, fundi not visualized, oral mucosa unremarkable NECK:  No jugular venous distention, waveform within normal limits, carotid upstroke brisk and symmetric, no bruits, no thyromegaly LYMPHATICS:  No cervical, inguinal adenopathy LUNGS:  Clear to auscultation bilaterally BACK:  No CVA tenderness CHEST:  Unremarkable HEART:  PMI not displaced or sustained,S1 and S2 within normal limits, no S3, no S4, no clicks, no rubs, apical systolic murmur radiating out the aortic outflow tract, no diastolic murmurs ABD:  Flat, positive bowel sounds normal in frequency in pitch, no bruits, no rebound, no guarding, no midline pulsatile mass, no hepatomegaly, no splenomegaly EXT:  2 plus pulses throughout, no edema, no cyanosis no clubbing SKIN:  No rashes no nodules NEURO:  Cranial nerves II through XII grossly intact, motor grossly intact throughout PSYCH:   Cognitively intact, oriented to person place and time   ASSESSMENT AND PLAN  HTN:  I would tend to believe the machine readings and I would suggest that her systolics probably do run in the 170s. However, I cannot prove this and would like to have more accurate data. For the short term I would not change her medications. However, she will start to use the machine she has and keep data and then come back for her appointment with Dr. Radford Pax.  She will have her machine correlated with our readings and then decisions can be made about adjusting her medications.  I reviewed previous [facility] records and the previous echo results.  I reviewed her outpatient BP recordings.    DIASTOLIC HF:  She seems to be euvolemic.  At this point, no change in therapy is indicated.  We have reviewed salt and fluid restrictions.  No further cardiovascular testing is indicated.  We talked about salt restriction and she is good with this. No change in therapy is indicated.  AS:  I would suspect by clinical exam but this is still mild. No further imaging is warranted.

## 2014-01-06 ENCOUNTER — Ambulatory Visit: Payer: Medicare Other | Admitting: Cardiology

## 2014-01-13 ENCOUNTER — Inpatient Hospital Stay (HOSPITAL_COMMUNITY)
Admission: EM | Admit: 2014-01-13 | Discharge: 2014-01-18 | DRG: 193 | Disposition: A | Discharge status: Nursing Home (Includes ICF) | Payer: Medicare Other | Attending: Internal Medicine | Admitting: Internal Medicine

## 2014-01-13 ENCOUNTER — Encounter (HOSPITAL_COMMUNITY): Payer: Self-pay | Admitting: Emergency Medicine

## 2014-01-13 ENCOUNTER — Emergency Department (HOSPITAL_COMMUNITY): Payer: Medicare Other

## 2014-01-13 DIAGNOSIS — I359 Nonrheumatic aortic valve disorder, unspecified: Secondary | ICD-10-CM | POA: Diagnosis present

## 2014-01-13 DIAGNOSIS — J1100 Influenza due to unidentified influenza virus with unspecified type of pneumonia: Principal | ICD-10-CM | POA: Diagnosis present

## 2014-01-13 DIAGNOSIS — R0902 Hypoxemia: Secondary | ICD-10-CM

## 2014-01-13 DIAGNOSIS — Z853 Personal history of malignant neoplasm of breast: Secondary | ICD-10-CM | POA: Diagnosis not present

## 2014-01-13 DIAGNOSIS — J9600 Acute respiratory failure, unspecified whether with hypoxia or hypercapnia: Secondary | ICD-10-CM | POA: Diagnosis present

## 2014-01-13 DIAGNOSIS — E669 Obesity, unspecified: Secondary | ICD-10-CM | POA: Diagnosis present

## 2014-01-13 DIAGNOSIS — R05 Cough: Secondary | ICD-10-CM | POA: Diagnosis not present

## 2014-01-13 DIAGNOSIS — J9819 Other pulmonary collapse: Secondary | ICD-10-CM | POA: Diagnosis not present

## 2014-01-13 DIAGNOSIS — H3530 Unspecified macular degeneration: Secondary | ICD-10-CM | POA: Diagnosis present

## 2014-01-13 DIAGNOSIS — D696 Thrombocytopenia, unspecified: Secondary | ICD-10-CM | POA: Diagnosis not present

## 2014-01-13 DIAGNOSIS — I10 Essential (primary) hypertension: Secondary | ICD-10-CM | POA: Diagnosis present

## 2014-01-13 DIAGNOSIS — F329 Major depressive disorder, single episode, unspecified: Secondary | ICD-10-CM | POA: Diagnosis present

## 2014-01-13 DIAGNOSIS — Z6835 Body mass index (BMI) 35.0-35.9, adult: Secondary | ICD-10-CM | POA: Diagnosis not present

## 2014-01-13 DIAGNOSIS — I509 Heart failure, unspecified: Secondary | ICD-10-CM | POA: Diagnosis present

## 2014-01-13 DIAGNOSIS — E039 Hypothyroidism, unspecified: Secondary | ICD-10-CM | POA: Diagnosis present

## 2014-01-13 DIAGNOSIS — I5032 Chronic diastolic (congestive) heart failure: Secondary | ICD-10-CM | POA: Diagnosis present

## 2014-01-13 DIAGNOSIS — J209 Acute bronchitis, unspecified: Secondary | ICD-10-CM | POA: Diagnosis present

## 2014-01-13 DIAGNOSIS — R110 Nausea: Secondary | ICD-10-CM | POA: Diagnosis not present

## 2014-01-13 DIAGNOSIS — R059 Cough, unspecified: Secondary | ICD-10-CM | POA: Diagnosis not present

## 2014-01-13 DIAGNOSIS — I5030 Unspecified diastolic (congestive) heart failure: Secondary | ICD-10-CM

## 2014-01-13 DIAGNOSIS — J40 Bronchitis, not specified as acute or chronic: Secondary | ICD-10-CM | POA: Diagnosis not present

## 2014-01-13 DIAGNOSIS — R079 Chest pain, unspecified: Secondary | ICD-10-CM | POA: Diagnosis not present

## 2014-01-13 DIAGNOSIS — F3289 Other specified depressive episodes: Secondary | ICD-10-CM | POA: Diagnosis present

## 2014-01-13 DIAGNOSIS — E559 Vitamin D deficiency, unspecified: Secondary | ICD-10-CM | POA: Diagnosis present

## 2014-01-13 DIAGNOSIS — J111 Influenza due to unidentified influenza virus with other respiratory manifestations: Secondary | ICD-10-CM

## 2014-01-13 DIAGNOSIS — F411 Generalized anxiety disorder: Secondary | ICD-10-CM | POA: Diagnosis present

## 2014-01-13 DIAGNOSIS — J159 Unspecified bacterial pneumonia: Secondary | ICD-10-CM

## 2014-01-13 DIAGNOSIS — R0989 Other specified symptoms and signs involving the circulatory and respiratory systems: Secondary | ICD-10-CM | POA: Diagnosis not present

## 2014-01-13 HISTORY — DX: Shortness of breath: R06.02

## 2014-01-13 HISTORY — DX: Depression, unspecified: F32.A

## 2014-01-13 HISTORY — DX: Pneumonia, unspecified organism: J18.9

## 2014-01-13 HISTORY — DX: Unspecified bacterial pneumonia: J15.9

## 2014-01-13 HISTORY — DX: Unspecified chronic bronchitis: J42

## 2014-01-13 HISTORY — DX: Anxiety disorder, unspecified: F41.9

## 2014-01-13 HISTORY — DX: Unspecified osteoarthritis, unspecified site: M19.90

## 2014-01-13 HISTORY — DX: Major depressive disorder, single episode, unspecified: F32.9

## 2014-01-13 LAB — CBC WITH DIFFERENTIAL/PLATELET
BASOS ABS: 0 10*3/uL (ref 0.0–0.1)
BASOS PCT: 1 % (ref 0–1)
EOS ABS: 0.1 10*3/uL (ref 0.0–0.7)
Eosinophils Relative: 1 % (ref 0–5)
HCT: 36.5 % (ref 36.0–46.0)
Hemoglobin: 12.2 g/dL (ref 12.0–15.0)
Lymphocytes Relative: 17 % (ref 12–46)
Lymphs Abs: 0.9 10*3/uL (ref 0.7–4.0)
MCH: 31.2 pg (ref 26.0–34.0)
MCHC: 33.4 g/dL (ref 30.0–36.0)
MCV: 93.4 fL (ref 78.0–100.0)
Monocytes Absolute: 1 10*3/uL (ref 0.1–1.0)
Monocytes Relative: 19 % — ABNORMAL HIGH (ref 3–12)
NEUTROS PCT: 62 % (ref 43–77)
Neutro Abs: 3.1 10*3/uL (ref 1.7–7.7)
PLATELETS: 94 10*3/uL — AB (ref 150–400)
RBC: 3.91 MIL/uL (ref 3.87–5.11)
RDW: 14.7 % (ref 11.5–15.5)
WBC: 5.1 10*3/uL (ref 4.0–10.5)

## 2014-01-13 LAB — BASIC METABOLIC PANEL
BUN: 19 mg/dL (ref 6–23)
CO2: 25 mEq/L (ref 19–32)
Calcium: 9.4 mg/dL (ref 8.4–10.5)
Chloride: 97 mEq/L (ref 96–112)
Creatinine, Ser: 0.94 mg/dL (ref 0.50–1.10)
GFR, EST AFRICAN AMERICAN: 57 mL/min — AB (ref >90)
GFR, EST NON AFRICAN AMERICAN: 50 mL/min — AB (ref >90)
Glucose, Bld: 107 mg/dL — ABNORMAL HIGH (ref 70–99)
POTASSIUM: 4.3 meq/L (ref 3.7–5.3)
SODIUM: 139 meq/L (ref 137–147)

## 2014-01-13 LAB — PRO B NATRIURETIC PEPTIDE: Pro B Natriuretic peptide (BNP): 3487 pg/mL — ABNORMAL HIGH (ref 0–450)

## 2014-01-13 LAB — I-STAT TROPONIN, ED: TROPONIN I, POC: 0.03 ng/mL (ref 0.00–0.08)

## 2014-01-13 MED ORDER — ACETAMINOPHEN 325 MG PO TABS
650.0000 mg | ORAL_TABLET | Freq: Four times a day (QID) | ORAL | Status: DC | PRN
  Administered 2014-01-14 – 2014-01-18 (×4): 650 mg via ORAL
  Filled 2014-01-13 (×5): qty 2

## 2014-01-13 MED ORDER — GUAIFENESIN-DM 100-10 MG/5ML PO SYRP
5.0000 mL | ORAL_SOLUTION | ORAL | Status: DC | PRN
  Administered 2014-01-13 – 2014-01-15 (×2): 5 mL via ORAL
  Filled 2014-01-13 (×3): qty 5

## 2014-01-13 MED ORDER — HEPARIN SODIUM (PORCINE) 5000 UNIT/ML IJ SOLN
5000.0000 [IU] | Freq: Three times a day (TID) | INTRAMUSCULAR | Status: DC
  Administered 2014-01-13 – 2014-01-18 (×12): 5000 [IU] via SUBCUTANEOUS
  Filled 2014-01-13 (×17): qty 1

## 2014-01-13 MED ORDER — POTASSIUM CHLORIDE ER 10 MEQ PO TBCR
10.0000 meq | EXTENDED_RELEASE_TABLET | Freq: Every day | ORAL | Status: DC
  Administered 2014-01-13 – 2014-01-18 (×6): 10 meq via ORAL
  Filled 2014-01-13 (×6): qty 1

## 2014-01-13 MED ORDER — ALUM & MAG HYDROXIDE-SIMETH 200-200-20 MG/5ML PO SUSP: 30.0000 mL | Freq: Four times a day (QID) | ORAL | Status: DC | PRN

## 2014-01-13 MED ORDER — ALBUTEROL SULFATE (2.5 MG/3ML) 0.083% IN NEBU
2.5000 mg | INHALATION_SOLUTION | RESPIRATORY_TRACT | Status: DC | PRN
  Administered 2014-01-13 – 2014-01-15 (×6): 2.5 mg via RESPIRATORY_TRACT
  Filled 2014-01-13 (×6): qty 3

## 2014-01-13 MED ORDER — ONDANSETRON HCL 4 MG PO TABS: 4.0000 mg | ORAL_TABLET | Freq: Four times a day (QID) | ORAL | Status: DC | PRN

## 2014-01-13 MED ORDER — VANCOMYCIN HCL IN DEXTROSE 1-5 GM/200ML-% IV SOLN
1000.0000 mg | Freq: Once | INTRAVENOUS | Status: AC
  Administered 2014-01-13: 1000 mg via INTRAVENOUS
  Filled 2014-01-13: qty 200

## 2014-01-13 MED ORDER — OSELTAMIVIR PHOSPHATE 75 MG PO CAPS
75.0000 mg | ORAL_CAPSULE | Freq: Two times a day (BID) | ORAL | Status: DC
  Administered 2014-01-13 – 2014-01-14 (×3): 75 mg via ORAL
  Filled 2014-01-13 (×5): qty 1

## 2014-01-13 MED ORDER — SODIUM CHLORIDE 0.9 % IV SOLN: 250.0000 mL | INTRAVENOUS | Status: DC | PRN

## 2014-01-13 MED ORDER — ACETAMINOPHEN 650 MG RE SUPP: 650.0000 mg | Freq: Four times a day (QID) | RECTAL | Status: DC | PRN

## 2014-01-13 MED ORDER — SODIUM CHLORIDE 0.9 % IJ SOLN: 3.0000 mL | INTRAMUSCULAR | Status: DC | PRN

## 2014-01-13 MED ORDER — FUROSEMIDE 40 MG PO TABS
40.0000 mg | ORAL_TABLET | Freq: Every day | ORAL | Status: DC
  Administered 2014-01-13 – 2014-01-18 (×6): 40 mg via ORAL
  Filled 2014-01-13 (×6): qty 1

## 2014-01-13 MED ORDER — VANCOMYCIN HCL IN DEXTROSE 1-5 GM/200ML-% IV SOLN
1000.0000 mg | INTRAVENOUS | Status: DC
  Administered 2014-01-14 – 2014-01-15 (×2): 1000 mg via INTRAVENOUS
  Filled 2014-01-13 (×2): qty 200

## 2014-01-13 MED ORDER — PINDOLOL 5 MG PO TABS
5.0000 mg | ORAL_TABLET | Freq: Every day | ORAL | Status: DC
  Administered 2014-01-13 – 2014-01-18 (×6): 5 mg via ORAL
  Filled 2014-01-13 (×6): qty 1

## 2014-01-13 MED ORDER — ALBUTEROL SULFATE (2.5 MG/3ML) 0.083% IN NEBU
5.0000 mg | INHALATION_SOLUTION | Freq: Once | RESPIRATORY_TRACT | Status: DC
  Filled 2014-01-13: qty 6

## 2014-01-13 MED ORDER — LEVOFLOXACIN IN D5W 750 MG/150ML IV SOLN: 750.0000 mg | Freq: Once | INTRAVENOUS | Status: DC

## 2014-01-13 MED ORDER — LEVOTHYROXINE SODIUM 88 MCG PO TABS
88.0000 ug | ORAL_TABLET | Freq: Every day | ORAL | Status: DC
  Administered 2014-01-14 – 2014-01-18 (×5): 88 ug via ORAL
  Filled 2014-01-13 (×6): qty 1

## 2014-01-13 MED ORDER — LEVOFLOXACIN IN D5W 750 MG/150ML IV SOLN: 750.0000 mg | INTRAVENOUS | Status: DC

## 2014-01-13 MED ORDER — LEVOFLOXACIN 500 MG PO TABS
500.0000 mg | ORAL_TABLET | ORAL | Status: DC
  Administered 2014-01-13: 500 mg via ORAL
  Filled 2014-01-13 (×2): qty 1

## 2014-01-13 MED ORDER — VITAMIN C 500 MG PO TABS
500.0000 mg | ORAL_TABLET | Freq: Every day | ORAL | Status: DC
  Administered 2014-01-13 – 2014-01-18 (×6): 500 mg via ORAL
  Filled 2014-01-13 (×6): qty 1

## 2014-01-13 MED ORDER — SENNOSIDES-DOCUSATE SODIUM 8.6-50 MG PO TABS: 1.0000 | ORAL_TABLET | Freq: Every evening | ORAL | Status: DC | PRN

## 2014-01-13 MED ORDER — ESCITALOPRAM OXALATE 5 MG PO TABS
5.0000 mg | ORAL_TABLET | Freq: Every day | ORAL | Status: DC
  Administered 2014-01-13 – 2014-01-18 (×6): 5 mg via ORAL
  Filled 2014-01-13 (×6): qty 1

## 2014-01-13 MED ORDER — SODIUM CHLORIDE 0.9 % IJ SOLN
3.0000 mL | Freq: Two times a day (BID) | INTRAMUSCULAR | Status: DC
  Administered 2014-01-14 – 2014-01-17 (×6): 3 mL via INTRAVENOUS

## 2014-01-13 MED ORDER — ONDANSETRON HCL 4 MG/2ML IJ SOLN: 4.0000 mg | Freq: Four times a day (QID) | INTRAMUSCULAR | Status: DC | PRN

## 2014-01-13 NOTE — Progress Notes (Signed)
Received pt assignment at 1705. Called ED Lenna Sciara, RN) for report at 1715. Melissa indicated she needed to call back.

## 2014-01-13 NOTE — Progress Notes (Signed)
Report received from ED at 1715. Awaiting pt arrival.

## 2014-01-13 NOTE — ED Provider Notes (Signed)
CSN: [id number]     Arrival date & time 01/13/14  1311 History   First MD Initiated Contact with Patient 01/13/14 1334     Chief Complaint  Patient presents with  . Influenza     (Consider location/radiation/quality/duration/timing/severity/associated sxs/prior Treatment) HPI Comments: Patient is a 80 year old female with history of chronic congestive heart failure, hypothyroidism, anxiety, depression, hypertension, moderate aortic stenosis, first cancer who presents today for generalized malaise. She was at her primary care physician's office earlier today and was sent here for further evaluation. She had a positive influenza test at that time. She has had a gradually worsening cough since Monday. Her sputum is green. She feels short of breath. She does not use oxygen at home, but occasionally uses a nebulizer treatment. She denies any chest pain, nausea, vomiting, abdominal pain.  Patient is a 80 y.o. female presenting with flu symptoms. The history is provided by the patient. No language interpreter was used.  Influenza Presenting symptoms: cough, myalgias and shortness of breath   Presenting symptoms: no fever, no nausea and no vomiting   Associated symptoms: no chills     Past Medical History  Diagnosis Date  . Chronic CHF (congestive heart failure)     Preserved EF  . Lower extremity edema     CHRONIC  . Hypothyroidism   . Anxiety and depression   . Hypertension   . Moderate aortic stenosis 05/2012    by ECHO  . Cervical disc disease     W radiculopathy S/P epidural injections  . Vitamin D deficiency   . Breast cancer   . Macular degeneration     Dr Hillis Range Follows for pt.   Past Surgical History  Procedure Laterality Date  . Cararact removal     Family History  Problem Relation Age of Onset  . CVA Father   . Pneumonia Father   . Multiple sclerosis Brother   . CVA Brother    History  Substance Use Topics  . Smoking status: Never Smoker   . Smokeless tobacco:  Not on file  . Alcohol Use: No   OB History   Grav Para Term Preterm Abortions TAB SAB Ect Mult Living                 Review of Systems  Constitutional: Negative for fever and chills.  Respiratory: Positive for cough and shortness of breath.   Cardiovascular: Negative for chest pain.  Gastrointestinal: Negative for nausea, vomiting and abdominal pain.  Musculoskeletal: Positive for myalgias.  All other systems reviewed and are negative.      Allergies  Codeine and Penicillins  Home Medications   Current Outpatient Rx  Name  Route  Sig  Dispense  Refill  . albuterol (PROVENTIL) (5 MG/ML) 0.5% nebulizer solution   Nebulization   Take 2.5 mg by nebulization every 6 (six) hours as needed for wheezing or shortness of breath.         . Cholecalciferol (VITAMIN D3) 2000 UNITS CHEW   Oral   Chew 2,000 mg by mouth daily.         Marland Kitchen erythromycin ophthalmic ointment   Ophthalmic   Apply 1 application to eye every 6 (six) hours.         Marland Kitchen escitalopram (LEXAPRO) 10 MG tablet   Oral   Take 5 mg by mouth daily.         . furosemide (LASIX) 80 MG tablet   Oral   Take 40 mg by mouth  daily.          . levothyroxine (SYNTHROID, LEVOTHROID) 88 MCG tablet   Oral   Take 88 mcg by mouth daily.         . Multiple Vitamin (MULTIVITAMIN WITH MINERALS) TABS   Oral   Take 1 tablet by mouth daily.         . Multiple Vitamins-Minerals (PRESERVISION AREDS PO)   Oral   Take 1 capsule by mouth daily.         . ondansetron (ZOFRAN-ODT) 4 MG disintegrating tablet   Oral   Take 4 mg by mouth every 8 (eight) hours as needed for nausea or vomiting.         . pindolol (VISKEN) 5 MG tablet   Oral   Take 5 mg by mouth daily.         . potassium chloride (K-DUR) 10 MEQ tablet   Oral   Take 10 mEq by mouth daily.         . traMADol (ULTRAM) 50 MG tablet   Oral   Take by mouth. 1-2 tablets every 6 hours as needed for pain.         . vitamin C (ASCORBIC ACID) 500  MG tablet   Oral   Take 500 mg by mouth daily.          BP 154/65  Pulse 62  Temp(Src) 99.7 F (37.6 C) (Oral)  Resp 18  Ht 4\' 11"  (1.499 m)  Wt 179 lb (81.194 kg)  BMI 36.13 kg/m2  SpO2 92% Physical Exam  Nursing note and vitals reviewed. Constitutional: She is oriented to person, place, and time. She appears well-developed and well-nourished. She does not appear ill. No distress.  HENT:  Head: Normocephalic and atraumatic.  Right Ear: External ear normal.  Left Ear: External ear normal.  Nose: Nose normal.  Mouth/Throat: Oropharynx is clear and moist.  Eyes: Conjunctivae are normal.  Neck: Normal range of motion.  Cardiovascular: Normal rate, regular rhythm and normal heart sounds.   Pulmonary/Chest: Effort normal. No stridor. No respiratory distress. She has wheezes (mild, expiratory).  Abdominal: Soft. She exhibits no distension.  Musculoskeletal: Normal range of motion.  Neurological: She is alert and oriented to person, place, and time. She has normal strength. GCS eye subscore is 4. GCS verbal subscore is 5. GCS motor subscore is 6.  Skin: Skin is warm and dry. She is not diaphoretic. No erythema.  Psychiatric: She has a normal mood and affect. Her behavior is normal.    ED Course  Procedures (including critical care time) Labs Review Labs Reviewed  CBC WITH DIFFERENTIAL - Abnormal; Notable for the following:    Platelets 94 (*)    Monocytes Relative 19 (*)    All other components within normal limits  BASIC METABOLIC PANEL - Abnormal; Notable for the following:    Glucose, Bld 107 (*)    GFR calc non Af Amer 50 (*)    GFR calc Af Amer 57 (*)    All other components within normal limits  PRO B NATRIURETIC PEPTIDE - Abnormal; Notable for the following:    Pro B Natriuretic peptide (BNP) 3487.0 (*)    All other components within normal limits  CULTURE, BLOOD (ROUTINE X 2)  CULTURE, BLOOD (ROUTINE X 2)  I-STAT TROPOININ, ED   Imaging Review Dg Chest 2  View  01/13/2014   CLINICAL DATA:  Cough, flu  EXAM: CHEST  2 VIEW  COMPARISON:  09/21/2012  FINDINGS:  Cardiomegaly is noted. Surgical clips in right axilla. Osteopenia and mild degenerative changes thoracic spine. No pulmonary edema. There is streaky left base retrocardiac atelectasis or infiltrate best seen on lateral view.  IMPRESSION: Streaky left base retrocardiac atelectasis or infiltrate. No pulmonary edema. Cardiomegaly.   Electronically Signed   By: Lahoma Crocker M.D.   On: 01/13/2014 14:39     EKG Interpretation   Date/Time:  Wednesday January 13 2014 13:49:41 EST Ventricular Rate:  65 PR Interval:  171 QRS Duration: 84 QT Interval:  423 QTC Calculation: 440 R Axis:   -11 Text Interpretation:  Sinus rhythm Abnormal R-wave progression, late  transition Confirmed by ALLEN  MD, ANTHONY ([id number]) on 01/13/2014 3:17:45 PM      MDM   Final diagnoses:  Influenza  Hypoxia    Patient presents to ED with positive influenza swab from PCP office. She is hypoxic on arrival at 84% on room air. Patient's oxygen saturations improve significantly with 3L of oxygen. CXR shows possible infiltrate in left base. Will treat with abx for CAP and tamiflu for influenza. Vital signs remain stable through ED stay. Patient will be admitted to medicine for further management. Admission is appreciated. Dr. Zenia Resides evaluated patient and agrees with plan. Patient / Family / Caregiver informed of clinical course, understand medical decision-making process, and agree with plan.   Elwyn Lade, PA-C 01/13/14 1544

## 2014-01-13 NOTE — Progress Notes (Signed)
Pt admitted to the unit at 1746. Pt mental status is a&ox4. Pt oriented to room, staff, and call bell. Skin is intact. Full assessment charted in [facility]. Call bell within reach.

## 2014-01-13 NOTE — ED Notes (Signed)
Pt has been feeling bad since Monday.  Pt sent here from Dr office with + flu test.

## 2014-01-13 NOTE — Progress Notes (Signed)
ANTIBIOTIC CONSULT NOTE - INITIAL  Pharmacy Consult for  Vancomycin and Levquin Indication: pneumonia  Allergies  Allergen Reactions  . Codeine     Lip Swelling  . Penicillins     Lip swelling    Patient Measurements: Height: 4\' 11"  (149.9 cm) Weight: 179 lb (81.194 kg) IBW/kg (Calculated) : 43.2 Adjusted Body Weight:   Vital Signs: Temp: 99.7 F (37.6 C) (03/04 1320) Temp src: Oral (03/04 1320) BP: 167/58 mmHg (03/04 1516) Pulse Rate: 67 (03/04 1516) Intake/Output from previous day:   Intake/Output from this shift:    Labs:  Recent Labs  01/13/14 1342  WBC 5.1  HGB 12.2  PLT 94*  CREATININE 0.94   Estimated Creatinine Clearance: 32.3 ml/min (by C-G formula based on Cr of 0.94). No results found for this basename: VANCOTROUGH, VANCOPEAK, VANCORANDOM, GENTTROUGH, GENTPEAK, GENTRANDOM, TOBRATROUGH, TOBRAPEAK, TOBRARND, AMIKACINPEAK, AMIKACINTROU, AMIKACIN,  in the last 72 hours   Microbiology: No results found for this or any previous visit (from the past 720 hour(s)).  Medical History: Past Medical History  Diagnosis Date  . Chronic CHF (congestive heart failure)     Preserved EF  . Lower extremity edema     CHRONIC  . Hypothyroidism   . Anxiety and depression   . Hypertension   . Moderate aortic stenosis 05/2012    by ECHO  . Cervical disc disease     W radiculopathy S/P epidural injections  . Vitamin D deficiency   . Breast cancer   . Macular degeneration     Dr Hillis Range Follows for pt.    Medications:  Scheduled:  . albuterol  5 mg Nebulization Once  . levofloxacin (LEVAQUIN) IV  750 mg Intravenous Once  . [START ON 01/15/2014] levofloxacin (LEVAQUIN) IV  750 mg Intravenous Q48H  . oseltamivir  75 mg Oral BID  . [START ON 01/14/2014] vancomycin  1,000 mg Intravenous Q24H   Assessment: 80 yr old female was sent to the hospital from her MD office due to a positive flu test. Her chest xrays also showed pneumonia. Her symptoms were cough, myalgias  and SOB. Pharmacy has been asked to dose Vancomycin and Levaquin. Pt has allergies to penicillins and Codeine.  Goal of Therapy:  Vancomycin trough level 15-20 mcg/ml  Plan:  The pt's CrCl is 32 ml/min.   Vancomycin 1 Gm q 24 hrs. Levels when appropriate. Levaquin 750 mg q48 hrs. Follow renal function and adjust doses as necessary.  Minta Balsam 01/13/2014,5:46 PM

## 2014-01-13 NOTE — ED Provider Notes (Signed)
Medical screening examination/treatment/procedure(s) were conducted as a shared visit with non-physician practitioner(s) and myself.  I personally evaluated the patient during the encounter.   EKG Interpretation   Date/Time:  Wednesday January 13 2014 13:49:41 EST Ventricular Rate:  65 PR Interval:  171 QRS Duration: 84 QT Interval:  423 QTC Calculation: 440 R Axis:   -11 Text Interpretation:  Sinus rhythm Abnormal R-wave progression, late  transition Confirmed by Lya Holben  MD, Jahir Halt ([id number]) on 01/13/2014 3:17:45 PM     Pt here with cough and positive flu test--cxr c/w pneumonia, lungs with corse breath sounds, will be admitted to medicine  Leota Jacobsen, MD 01/13/14 1547

## 2014-01-13 NOTE — H&P (Signed)
[facility] History and Physical  Kathy Hoover NWG:[id number] DOB: 1916/11/12 DOA: 01/13/2014  Referring physician: flu, hypoxia PCP: Kathy Pound, MD   Chief Complaint: weak  HPI: Kathy Hoover is a 80 y.o. female  Sent to ED from PCP. Had positive flu swab and sats 83%. Became ill 2 days ago with cough, fatigue, wheeze, dyspnea. No f/c. Appetite good. No N/V. One loose stool today.  CXR shows LLL infiltrate.  sats in the 90s now on supplemental oxygen.  Review of Systems:  Systems reviewed. As above, otherwise negative.  Past Medical History  Diagnosis Date  . Chronic CHF (congestive heart failure)     Preserved EF  . Lower extremity edema     CHRONIC  . Hypothyroidism   . Anxiety and depression   . Hypertension   . Moderate aortic stenosis 05/2012    by ECHO  . Cervical disc disease     W radiculopathy S/P epidural injections  . Vitamin D deficiency   . Macular degeneration     Dr Hillis Range Follows for pt.  . Anxiety   . Depression   . Exertional shortness of breath     "worse recently" (01/13/2014)  . Pneumonia     "once before today" (01/13/2014)  . Bacterial pneumonia 01/13/2014  . Chronic bronchitis     "@ least once a year;  in the winter" (01/13/2014)  . Breast cancer   . Arthritis     "terribly in my hands" (01/13/2014)   Past Surgical History  Procedure Laterality Date  . Cataract extraction w/ intraocular lens  implant, bilateral Bilateral ~ 2008  . Tonsillectomy  1949  . Abdominal hysterectomy    . Appendectomy  1949  . Breast biopsy Right ~ 2005  . Breast lumpectomy Right ~ 2005   Social History:  reports that she has never smoked. She has never used smokeless tobacco. She reports that she does not drink alcohol or use illicit drugs.  Allergies  Allergen Reactions  . Codeine     Lip Swelling  . Penicillins     Lip swelling    Family History  Problem Relation Age of Onset  . CVA Father   . Pneumonia Father   . Multiple sclerosis Brother   . CVA  Brother      Prior to Admission medications   Medication Sig Start Date End Date Taking? Authorizing Provider  Cholecalciferol (VITAMIN D3) 2000 UNITS CHEW Chew 2,000 mg by mouth daily.   Yes Historical Provider, MD  escitalopram (LEXAPRO) 10 MG tablet Take 5 mg by mouth daily.   Yes Historical Provider, MD  furosemide (LASIX) 40 MG tablet Take 40 mg by mouth.   Yes Historical Provider, MD  levothyroxine (SYNTHROID, LEVOTHROID) 88 MCG tablet Take 88 mcg by mouth daily.   Yes Historical Provider, MD  Multiple Vitamin (MULTIVITAMIN WITH MINERALS) TABS Take 1 tablet by mouth daily.   Yes Historical Provider, MD  Multiple Vitamins-Minerals (PRESERVISION AREDS PO) Take 1 capsule by mouth daily.   Yes Historical Provider, MD  pindolol (VISKEN) 5 MG tablet Take 5 mg by mouth daily.   Yes Historical Provider, MD  Polyethyl Glycol-Propyl Glycol (SYSTANE OP) Apply 1-2 drops to eye 4 (four) times daily as needed (dry eyes).   Yes Historical Provider, MD  potassium chloride (K-DUR) 10 MEQ tablet Take 10 mEq by mouth daily.   Yes Historical Provider, MD  vitamin C (ASCORBIC ACID) 500 MG tablet Take 500 mg by mouth daily.   Yes  Historical Provider, MD   Physical Exam: Filed Vitals:   01/13/14 1748  BP: 149/69  Pulse: 69  Temp: 98.9 F (37.2 C)  Resp: 16    BP 149/69  Pulse 69  Temp(Src) 98.9 F (37.2 C) (Oral)  Resp 16  Ht 4\' 11"  (1.499 m)  Wt 81.194 kg (179 lb)  BMI 36.13 kg/m2  SpO2 83%  BP 149/69  Pulse 69  Temp(Src) 98.9 F (37.2 C) (Oral)  Resp 16  Ht 4\' 11"  (1.499 m)  Wt 81.194 kg (179 lb)  BMI 36.13 kg/m2  SpO2 83%  General Appearance:    Alert, cooperative, no distress, appears younger than stated age. nontoxic  Head:    Normocephalic, without obvious abnormality, atraumatic  Eyes:    PERRL, conjunctiva/corneas clear, EOM's intact, fundi    benign, both eyes     Nose:   Nares normal, septum midline, mucosa normal, no drainage    or sinus tenderness  Throat:   Lips,  mucosa, and tongue normal; teeth and gums normal  Neck:   Supple, symmetrical, trachea midline, no adenopathy;    thyroid:  no enlargement/tenderness/nodules; no carotid   bruit or JVD  Back:     Symmetric, no curvature, ROM normal, no CVA tenderness  Lungs:     Rales left base  Chest Wall:    No tenderness or deformity   Heart:    Regular rate and rhythm, S1 and S2 normal, no murmur, rub   or gallop     Abdomen:     Soft, non-tender, bowel sounds active all four quadrants,    no masses, no organomegaly  Genitalia:   deferred  Rectal:   deferred  Extremities:   Extremities normal, atraumatic, no cyanosis or edema  Pulses:   2+ and symmetric all extremities  Skin:   Skin color, texture, turgor normal, no rashes or lesions  Lymph nodes:   Cervical, supraclavicular, and axillary nodes normal  Neurologic:   CNII-XII intact, normal strength, sensation and reflexes    throughout            Labs on Admission:  Basic Metabolic Panel:  Recent Labs Lab 01/13/14 1342  NA 139  K 4.3  CL 97  CO2 25  GLUCOSE 107*  BUN 19  CREATININE 0.94  CALCIUM 9.4   Liver Function Tests: No results found for this basename: AST, ALT, ALKPHOS, BILITOT, PROT, ALBUMIN,  in the last 168 hours No results found for this basename: LIPASE, AMYLASE,  in the last 168 hours No results found for this basename: AMMONIA,  in the last 168 hours CBC:  Recent Labs Lab 01/13/14 1342  WBC 5.1  NEUTROABS 3.1  HGB 12.2  HCT 36.5  MCV 93.4  PLT 94*   Cardiac Enzymes: No results found for this basename: CKTOTAL, CKMB, CKMBINDEX, TROPONINI,  in the last 168 hours  BNP (last 3 results)  Recent Labs  01/13/14 1343  PROBNP 3487.0*   CBG: No results found for this basename: GLUCAP,  in the last 168 hours  Radiological Exams on Admission: Dg Chest 2 View  01/13/2014   CLINICAL DATA:  Cough, flu  EXAM: CHEST  2 VIEW  COMPARISON:  09/21/2012  FINDINGS: Cardiomegaly is noted. Surgical clips in right axilla.  Osteopenia and mild degenerative changes thoracic spine. No pulmonary edema. There is streaky left base retrocardiac atelectasis or infiltrate best seen on lateral view.  IMPRESSION: Streaky left base retrocardiac atelectasis or infiltrate. No pulmonary edema. Cardiomegaly.  Electronically Signed   By: Lahoma Crocker M.D.   On: 01/13/2014 14:39    Assessment/Plan Principal Problem:   Acute respiratory failure: admit, oxygen. Prn nebs Active Problems:   Influenza with pneumonia: tamiflu, levaquin, vancomycin   Essential hypertension, benign   Chronic diastolic heart failure   Obesity, unspecified   Code Status: *full Family Communication: *son at bedside Disposition Plan: *back to ILF  Time spent: 60 min  [facility] Pager [phone]

## 2014-01-14 DIAGNOSIS — I10 Essential (primary) hypertension: Secondary | ICD-10-CM | POA: Diagnosis not present

## 2014-01-14 DIAGNOSIS — D696 Thrombocytopenia, unspecified: Secondary | ICD-10-CM

## 2014-01-14 DIAGNOSIS — J1100 Influenza due to unidentified influenza virus with unspecified type of pneumonia: Secondary | ICD-10-CM | POA: Diagnosis not present

## 2014-01-14 DIAGNOSIS — J9600 Acute respiratory failure, unspecified whether with hypoxia or hypercapnia: Secondary | ICD-10-CM | POA: Diagnosis not present

## 2014-01-14 LAB — CBC
HEMATOCRIT: 34.8 % — AB (ref 36.0–46.0)
HEMOGLOBIN: 11.4 g/dL — AB (ref 12.0–15.0)
MCH: 31 pg (ref 26.0–34.0)
MCHC: 32.8 g/dL (ref 30.0–36.0)
MCV: 94.6 fL (ref 78.0–100.0)
Platelets: 113 10*3/uL — ABNORMAL LOW (ref 150–400)
RBC: 3.68 MIL/uL — ABNORMAL LOW (ref 3.87–5.11)
RDW: 14.8 % (ref 11.5–15.5)
WBC: 5.6 10*3/uL (ref 4.0–10.5)

## 2014-01-14 MED ORDER — VITAMIN D3 50 MCG (2000 UT) PO CHEW: 2000.0000 mg | CHEWABLE_TABLET | Freq: Every day | ORAL | Status: DC

## 2014-01-14 MED ORDER — OCUVITE-LUTEIN PO CAPS
1.0000 | ORAL_CAPSULE | Freq: Every day | ORAL | Status: DC
  Administered 2014-01-14 – 2014-01-18 (×5): 1 via ORAL
  Filled 2014-01-14 (×6): qty 1

## 2014-01-14 MED ORDER — ADULT MULTIVITAMIN W/MINERALS CH
1.0000 | ORAL_TABLET | Freq: Every day | ORAL | Status: DC
  Administered 2014-01-14 – 2014-01-18 (×6): 1 via ORAL
  Filled 2014-01-14 (×5): qty 1

## 2014-01-14 MED ORDER — OSELTAMIVIR PHOSPHATE 30 MG PO CAPS
30.0000 mg | ORAL_CAPSULE | Freq: Two times a day (BID) | ORAL | Status: AC
  Administered 2014-01-14 – 2014-01-17 (×6): 30 mg via ORAL
  Filled 2014-01-14 (×8): qty 1

## 2014-01-14 MED ORDER — GUAIFENESIN ER 600 MG PO TB12
600.0000 mg | ORAL_TABLET | Freq: Two times a day (BID) | ORAL | Status: DC
  Administered 2014-01-14 – 2014-01-18 (×7): 600 mg via ORAL
  Filled 2014-01-14 (×9): qty 1

## 2014-01-14 MED ORDER — LEVOFLOXACIN 250 MG PO TABS
250.0000 mg | ORAL_TABLET | ORAL | Status: DC
  Administered 2014-01-14 – 2014-01-17 (×4): 250 mg via ORAL
  Filled 2014-01-14 (×5): qty 1

## 2014-01-14 MED ORDER — PRESERVISION AREDS PO CAPS: 1.0000 | ORAL_CAPSULE | Freq: Every day | ORAL | Status: DC

## 2014-01-14 MED ORDER — VITAMIN D3 25 MCG (1000 UNIT) PO TABS
2000.0000 [IU] | ORAL_TABLET | Freq: Every day | ORAL | Status: DC
  Administered 2014-01-14 – 2014-01-18 (×5): 2000 [IU] via ORAL
  Filled 2014-01-14 (×5): qty 2

## 2014-01-14 NOTE — Progress Notes (Signed)
Utilization review completed.  

## 2014-01-14 NOTE — Progress Notes (Signed)
PROGRESS NOTE    Kathy Hoover JJK:[id number] DOB: [birth year] DOA: 01/13/2014 PCP: Gavin Pound, MD  HPI/Brief narrative 80 year old female with history of chronic diastolic CHF, hypothyroidism, anxiety and depression, hypertension, moderate aortic stenosis, sent from PCPs office to ED on 01/13/14 for complaints of productive cough, dyspnea, fatigue, positive flu swab and hypoxia with oxygen saturations of 83% on room air. In the ED, chest x-ray suggested left lower lobe infiltrate.  Assessment/Plan:  1. Acute respiratory failure: Secondary to influenza related acute bronchitis and pneumonia. Treat underlying cause and supportively with oxygen and when necessary bronchodilators. Monitor closely. 2. Influenza related acute bronchitis: Continue Tamiflu. 3. Left lower lobe pneumonia: Patient was started empirically on Levaquin and vancomycin-continue same and consider transitioning to oral Levaquin alone on 3/6. 4. Chronic diastolic CHF: Compensated clinically. 5. Thrombocytopenia: Follow CBCs 6. Hypertension: Controlled 7. Dysphasias to solids: Swallow eval by speech therapy.   Code Status: Full Family Communication: Discussed with son at bedside. Disposition Plan: Return to independent living facility when medically stable.   Consultants:  None  Procedures:  None  Antibiotics:  Oral levofloxacin 3/4 >  IV vancomycin 3/4 >   Subjective: Feels better. Stronger. No nausea. Still has chest congestion but denies dyspnea. As per nursing, complained of swallowing difficulties if food is too large.  Objective: Filed Vitals:   01/13/14 2041 01/14/14 0500 01/14/14 0610 01/14/14 1358  BP: 152/62 160/58 124/69 120/65  Pulse: 73 68  58  Temp: 99.4 F (37.4 C) 98.8 F (37.1 C)  98.2 F (36.8 C)  TempSrc: Oral Oral  Oral  Resp: 17 18  18   Height:      Weight:  80.2 kg (176 lb 12.9 oz)    SpO2: 90% 94%  97%    Intake/Output Summary (Last 24 hours) at 01/14/14 1522 Last data  filed at 01/14/14 1128  Gross per 24 hour  Intake      0 ml  Output    200 ml  Net   -200 ml   Filed Weights   01/13/14 1320 01/14/14 0500  Weight: 81.194 kg (179 lb) 80.2 kg (176 lb 12.9 oz)     Exam:  General exam: Pleasant elderly female lying comfortably in bed Respiratory system: Diminished breath sounds bilaterally with scattered few medium pitched expiratory rhonchi and occasional basal crackles. No increased work of breathing. Cardiovascular system: S1 & S2 heard, RRR. No JVD, gallops, clicks or pedal edema. 2 x 5 systolic ejection murmur at apex. Gastrointestinal system: Abdomen is nondistended, soft and nontender. Normal bowel sounds heard. Central nervous system: Alert and oriented. No focal neurological deficits. Extremities: Symmetric 5 x 5 power.   Data Reviewed: Basic Metabolic Panel:  Recent Labs Lab 01/13/14 1342  NA 139  K 4.3  CL 97  CO2 25  GLUCOSE 107*  BUN 19  CREATININE 0.94  CALCIUM 9.4   Liver Function Tests: No results found for this basename: AST, ALT, ALKPHOS, BILITOT, PROT, ALBUMIN,  in the last 168 hours No results found for this basename: LIPASE, AMYLASE,  in the last 168 hours No results found for this basename: AMMONIA,  in the last 168 hours CBC:  Recent Labs Lab 01/13/14 1342  WBC 5.1  NEUTROABS 3.1  HGB 12.2  HCT 36.5  MCV 93.4  PLT 94*   Cardiac Enzymes: No results found for this basename: CKTOTAL, CKMB, CKMBINDEX, TROPONINI,  in the last 168 hours BNP (last 3 results)  Recent Labs  01/13/14 1343  PROBNP 3487.0*  CBG: No results found for this basename: GLUCAP,  in the last 168 hours  Recent Results (from the past 240 hour(s))  CULTURE, BLOOD (ROUTINE X 2)     Status: None   Collection Time    01/13/14  1:55 PM      Result Value Ref Range Status   Specimen Description BLOOD LEFT ANTECUBITAL   Final   Special Requests BOTTLES DRAWN AEROBIC AND ANAEROBIC 5CC   Final   Culture  Setup Time     Final   Value:  01/13/2014 16:37     Performed at [facility]   Culture     Final   Value:        BLOOD CULTURE RECEIVED NO GROWTH TO DATE CULTURE WILL BE HELD FOR 5 DAYS BEFORE ISSUING A FINAL NEGATIVE REPORT     Performed at [facility]   Report Status PENDING   Incomplete  CULTURE, BLOOD (ROUTINE X 2)     Status: None   Collection Time    01/13/14  2:00 PM      Result Value Ref Range Status   Specimen Description BLOOD LEFT HAND   Final   Special Requests BOTTLES DRAWN AEROBIC ONLY 5CC   Final   Culture  Setup Time     Final   Value: 01/13/2014 16:38     Performed at [facility]   Culture     Final   Value:        BLOOD CULTURE RECEIVED NO GROWTH TO DATE CULTURE WILL BE HELD FOR 5 DAYS BEFORE ISSUING A FINAL NEGATIVE REPORT     Performed at [facility]   Report Status PENDING   Incomplete       Studies: Dg Chest 2 View  01/13/2014   CLINICAL DATA:  Cough, flu  EXAM: CHEST  2 VIEW  COMPARISON:  09/21/2012  FINDINGS: Cardiomegaly is noted. Surgical clips in right axilla. Osteopenia and mild degenerative changes thoracic spine. No pulmonary edema. There is streaky left base retrocardiac atelectasis or infiltrate best seen on lateral view.  IMPRESSION: Streaky left base retrocardiac atelectasis or infiltrate. No pulmonary edema. Cardiomegaly.   Electronically Signed   By: Lahoma Crocker M.D.   On: 01/13/2014 14:39        Scheduled Meds: . escitalopram  5 mg Oral Daily  . furosemide  40 mg Oral Daily  . heparin  5,000 Units Subcutaneous 3 times per day  . levofloxacin  250 mg Oral Q24H  . levothyroxine  88 mcg Oral QAC breakfast  . oseltamivir  30 mg Oral BID  . pindolol  5 mg Oral Daily  . potassium chloride  10 mEq Oral Daily  . sodium chloride  3 mL Intravenous Q12H  . vancomycin  1,000 mg Intravenous Q24H  . vitamin C  500 mg Oral Daily   Continuous Infusions:   Principal Problem:   Acute respiratory failure Active Problems:   Essential hypertension,  benign   Chronic diastolic heart failure   Obesity, unspecified   Influenza with pneumonia    Time spent: 73 minutes    HONGALGI,ANAND, MD, FACP, FHM. [facility] Pager [phone]  If 7PM-7AM, please contact night-coverage [email or web address] Password [email or web address] 01/14/2014, 3:22 PM    LOS: 1 day

## 2014-01-15 DIAGNOSIS — J9600 Acute respiratory failure, unspecified whether with hypoxia or hypercapnia: Secondary | ICD-10-CM | POA: Diagnosis not present

## 2014-01-15 DIAGNOSIS — J1100 Influenza due to unidentified influenza virus with unspecified type of pneumonia: Secondary | ICD-10-CM | POA: Diagnosis not present

## 2014-01-15 DIAGNOSIS — I5032 Chronic diastolic (congestive) heart failure: Secondary | ICD-10-CM | POA: Diagnosis not present

## 2014-01-15 MED ORDER — IPRATROPIUM-ALBUTEROL 0.5-2.5 (3) MG/3ML IN SOLN
3.0000 mL | Freq: Three times a day (TID) | RESPIRATORY_TRACT | Status: DC
  Administered 2014-01-15 – 2014-01-17 (×7): 3 mL via RESPIRATORY_TRACT
  Filled 2014-01-15 (×7): qty 3

## 2014-01-15 MED ORDER — ALBUTEROL SULFATE (2.5 MG/3ML) 0.083% IN NEBU: 2.5000 mg | INHALATION_SOLUTION | RESPIRATORY_TRACT | Status: DC | PRN

## 2014-01-15 NOTE — Evaluation (Signed)
Clinical/Bedside Swallow Evaluation Patient Details  Name: Kathy Hoover MRN: [id number] Date of Birth: [birth year]  Today's Date: 01/15/2014 Time: 1222-1245 SLP Time Calculation (min): 23 min  Past Medical History:  Past Medical History  Diagnosis Date  . Chronic CHF (congestive heart failure)     Preserved EF  . Lower extremity edema     CHRONIC  . Hypothyroidism   . Anxiety and depression   . Hypertension   . Moderate aortic stenosis 05/2012    by ECHO  . Cervical disc disease     W radiculopathy S/P epidural injections  . Vitamin D deficiency   . Macular degeneration     Dr Hillis Range Follows for pt.  . Anxiety   . Depression   . Exertional shortness of breath     "worse recently" (01/13/2014)  . Pneumonia     "once before today" (01/13/2014)  . Bacterial pneumonia 01/13/2014  . Chronic bronchitis     "@ least once a year;  in the winter" (01/13/2014)  . Breast cancer   . Arthritis     "terribly in my hands" (01/13/2014)   Past Surgical History:  Past Surgical History  Procedure Laterality Date  . Cataract extraction w/ intraocular lens  implant, bilateral Bilateral ~ 2008  . Tonsillectomy  1949  . Abdominal hysterectomy    . Appendectomy  1949  . Breast biopsy Right ~ 2005  . Breast lumpectomy Right ~ 2005   HPI:  80 yo female sent to ED from PCP. Had positive flu swab and sats 83%. Became ill 2 days ago with cough, fatigue, wheeze, dyspnea. No f/c. Appetite good. No N/V. One loose stool today.  CXR shows LLL infiltrate.  sats in the 90s now on supplemental oxygen.   Assessment / Plan / Recommendation Clinical Impression  No overt s/s of aspiration or dysphagia noted at b/s.  Pt. denies dysphagia.  No ST f/u recommended.    Aspiration Risk  Mild    Diet Recommendation Thin liquid;Regular   Liquid Administration via: Straw;Cup Medication Administration: Whole meds with liquid Supervision: Patient able to self feed Compensations: Slow rate;Small sips/bites Postural  Changes and/or Swallow Maneuvers: Seated upright 90 degrees    Other  Recommendations Oral Care Recommendations: Oral care BID   Follow Up Recommendations  None    Frequency and Duration        Pertinent Vitals/Pain Expiratory wheeze    SLP Swallow Goals   N/A  Swallow Study Prior Functional Status       General HPI: 80 yo female sent to ED from PCP. Had positive flu swab and sats 83%. Became ill 2 days ago with cough, fatigue, wheeze, dyspnea. No f/c. Appetite good. No N/V. One loose stool today.  CXR shows LLL infiltrate.  sats in the 90s now on supplemental oxygen. Type of Study: Bedside swallow evaluation Previous Swallow Assessment: Esophagram 11/14 revealed a small hiatal hernia Diet Prior to this Study: Regular;Thin liquids Temperature Spikes Noted: No Respiratory Status: Room air History of Recent Intubation: No Behavior/Cognition: Alert;Cooperative Oral Cavity - Dentition: Adequate natural dentition Self-Feeding Abilities: Able to feed self Patient Positioning: Upright in chair Baseline Vocal Quality: Clear Volitional Cough: Strong Volitional Swallow: Able to elicit    Oral/Motor/Sensory Function Overall Oral Motor/Sensory Function: Appears within functional limits for tasks assessed   Ice Chips Ice chips: Not tested   Thin Liquid Thin Liquid: Within functional limits Presentation: Cup;Straw    Nectar Thick Nectar Thick Liquid: Not tested   Honey  Thick Honey Thick Liquid: Not tested   Puree Puree: Within functional limits Presentation: Spoon;Self Fed   Solid   GO    Solid: Within functional limits       Danna Sewell T 01/15/2014,1:16 PM

## 2014-01-15 NOTE — Progress Notes (Signed)
SATURATION QUALIFICATIONS: (This note is used to comply with regulatory documentation for home oxygen)  Patient Saturations on Room Air at Rest = 88%  Patient Saturations on Room Air while Ambulating = 80%  Patient Saturations on 3 Liters of oxygen while Ambulating = 94%  Please briefly explain why patient needs home oxygen:

## 2014-01-15 NOTE — Progress Notes (Signed)
PROGRESS NOTE    Kathy Hoover SHF:[id number] DOB: [birth year] DOA: 01/13/2014 PCP: Gavin Pound, MD  HPI/Brief narrative 80 year old female with history of chronic diastolic CHF, hypothyroidism, anxiety and depression, hypertension, moderate aortic stenosis, sent from PCPs office to ED on 01/13/14 for complaints of productive cough, dyspnea, fatigue, positive flu swab and hypoxia with oxygen saturations of 83% on room air. In the ED, chest x-ray suggested left lower lobe infiltrate.  Assessment/Plan:  1. Acute respiratory failure: Secondary to influenza related acute bronchitis and pneumonia. Treat underlying cause and supportively with oxygen and when necessary bronchodilators. Monitor closely. Seems to be improving-ambulating check O2 saturations. 2. Influenza related acute bronchitis: Complete course of Tamiflu. 3. Left lower lobe pneumonia: Patient was started empirically on Levaquin and vancomycin-continue same and consider transitioning to oral Levaquin alone on 3/6. 4. Chronic diastolic CHF: Compensated clinically. 5. Thrombocytopenia: Follow CBCs 6. Hypertension: Controlled 7. Dysphasias to solids: Speech therapy recommends continued regular diet and thin liquids.   Code Status: Full Family Communication: Discussed with granddaughter at bedside. Disposition Plan: Return to independent living facility when medically stable-possibly next 24-48 hours.   Consultants:  None  Procedures:  None  Antibiotics:  Oral levofloxacin 3/4 >  IV vancomycin 3/4 > 3/6   Subjective: Did not sleep well last night secondary to uncomfortable bed. Denies dyspnea. Cough improving.  Objective: Filed Vitals:   01/15/14 0358 01/15/14 0648 01/15/14 1126 01/15/14 1316  BP:  132/60 120/52 111/66  Pulse:  63 60 54  Temp:  98.9 F (37.2 C)  97.6 F (36.4 C)  TempSrc:  Oral  Oral  Resp:  18  18  Height:      Weight: 78.3 kg (172 lb 9.9 oz)     SpO2:  98%  93%    Intake/Output Summary  (Last 24 hours) at 01/15/14 1802 Last data filed at 01/14/14 2214  Gross per 24 hour  Intake      0 ml  Output    100 ml  Net   -100 ml   Filed Weights   01/13/14 1320 01/14/14 0500 01/15/14 0358  Weight: 81.194 kg (179 lb) 80.2 kg (176 lb 12.9 oz) 78.3 kg (172 lb 9.9 oz)     Exam:  General exam: Pleasant elderly female seen sitting up in chair eating breakfast this morning Respiratory system: Much improved breath sounds with occasional bilateral rhonchi and basal crackles. No increased work of breathing. Cardiovascular system: S1 & S2 heard, RRR. No JVD, gallops, clicks or pedal edema. 2 x 5 systolic ejection murmur at apex. Gastrointestinal system: Abdomen is nondistended, soft and nontender. Normal bowel sounds heard. Central nervous system: Alert and oriented. No focal neurological deficits. Extremities: Symmetric 5 x 5 power.   Data Reviewed: Basic Metabolic Panel:  Recent Labs Lab 01/13/14 1342  NA 139  K 4.3  CL 97  CO2 25  GLUCOSE 107*  BUN 19  CREATININE 0.94  CALCIUM 9.4   Liver Function Tests: No results found for this basename: AST, ALT, ALKPHOS, BILITOT, PROT, ALBUMIN,  in the last 168 hours No results found for this basename: LIPASE, AMYLASE,  in the last 168 hours No results found for this basename: AMMONIA,  in the last 168 hours CBC:  Recent Labs Lab 01/13/14 1342 01/14/14 1834  WBC 5.1 5.6  NEUTROABS 3.1  --   HGB 12.2 11.4*  HCT 36.5 34.8*  MCV 93.4 94.6  PLT 94* 113*   Cardiac Enzymes: No results found for this basename: CKTOTAL,  CKMB, CKMBINDEX, TROPONINI,  in the last 168 hours BNP (last 3 results)  Recent Labs  01/13/14 1343  PROBNP 3487.0*   CBG: No results found for this basename: GLUCAP,  in the last 168 hours  Recent Results (from the past 240 hour(s))  CULTURE, BLOOD (ROUTINE X 2)     Status: None   Collection Time    01/13/14  1:55 PM      Result Value Ref Range Status   Specimen Description BLOOD LEFT ANTECUBITAL    Final   Special Requests BOTTLES DRAWN AEROBIC AND ANAEROBIC 5CC   Final   Culture  Setup Time     Final   Value: 01/13/2014 16:37     Performed at [facility]   Culture     Final   Value:        BLOOD CULTURE RECEIVED NO GROWTH TO DATE CULTURE WILL BE HELD FOR 5 DAYS BEFORE ISSUING A FINAL NEGATIVE REPORT     Performed at [facility]   Report Status PENDING   Incomplete  CULTURE, BLOOD (ROUTINE X 2)     Status: None   Collection Time    01/13/14  2:00 PM      Result Value Ref Range Status   Specimen Description BLOOD LEFT HAND   Final   Special Requests BOTTLES DRAWN AEROBIC ONLY 5CC   Final   Culture  Setup Time     Final   Value: 01/13/2014 16:38     Performed at [facility]   Culture     Final   Value:        BLOOD CULTURE RECEIVED NO GROWTH TO DATE CULTURE WILL BE HELD FOR 5 DAYS BEFORE ISSUING A FINAL NEGATIVE REPORT     Performed at [facility]   Report Status PENDING   Incomplete       Studies: No results found.      Scheduled Meds: . cholecalciferol  2,000 Units Oral Daily  . escitalopram  5 mg Oral Daily  . furosemide  40 mg Oral Daily  . guaiFENesin  600 mg Oral BID  . heparin  5,000 Units Subcutaneous 3 times per day  . ipratropium-albuterol  3 mL Nebulization TID  . levofloxacin  250 mg Oral Q24H  . levothyroxine  88 mcg Oral QAC breakfast  . multivitamin with minerals  1 tablet Oral Daily  . multivitamin-lutein  1 capsule Oral Daily  . oseltamivir  30 mg Oral BID  . pindolol  5 mg Oral Daily  . potassium chloride  10 mEq Oral Daily  . sodium chloride  3 mL Intravenous Q12H  . vancomycin  1,000 mg Intravenous Q24H  . vitamin C  500 mg Oral Daily   Continuous Infusions:   Principal Problem:   Acute respiratory failure Active Problems:   Essential hypertension, benign   Chronic diastolic heart failure   Obesity, unspecified   Influenza with pneumonia    Time spent: 39 minutes    Jonah Nestle, MD, FACP,  FHM. [facility] Pager [phone]  If 7PM-7AM, please contact night-coverage [email or web address] Password [email or web address] 01/15/2014, 6:02 PM    LOS: 2 days

## 2014-01-16 ENCOUNTER — Inpatient Hospital Stay (HOSPITAL_COMMUNITY): Payer: Medicare Other

## 2014-01-16 DIAGNOSIS — I5032 Chronic diastolic (congestive) heart failure: Secondary | ICD-10-CM | POA: Diagnosis not present

## 2014-01-16 DIAGNOSIS — J1100 Influenza due to unidentified influenza virus with unspecified type of pneumonia: Secondary | ICD-10-CM | POA: Diagnosis not present

## 2014-01-16 DIAGNOSIS — D696 Thrombocytopenia, unspecified: Secondary | ICD-10-CM | POA: Diagnosis not present

## 2014-01-16 DIAGNOSIS — J9600 Acute respiratory failure, unspecified whether with hypoxia or hypercapnia: Secondary | ICD-10-CM | POA: Diagnosis not present

## 2014-01-16 DIAGNOSIS — R079 Chest pain, unspecified: Secondary | ICD-10-CM | POA: Diagnosis not present

## 2014-01-16 LAB — CBC
HCT: 34.1 % — ABNORMAL LOW (ref 36.0–46.0)
Hemoglobin: 11.4 g/dL — ABNORMAL LOW (ref 12.0–15.0)
MCH: 31.2 pg (ref 26.0–34.0)
MCHC: 33.4 g/dL (ref 30.0–36.0)
MCV: 93.4 fL (ref 78.0–100.0)
Platelets: 92 10*3/uL — ABNORMAL LOW (ref 150–400)
RBC: 3.65 MIL/uL — ABNORMAL LOW (ref 3.87–5.11)
RDW: 14.3 % (ref 11.5–15.5)
WBC: 5.2 10*3/uL (ref 4.0–10.5)

## 2014-01-16 MED ORDER — ACETAMINOPHEN 325 MG PO TABS
650.0000 mg | ORAL_TABLET | Freq: Three times a day (TID) | ORAL | Status: AC
  Administered 2014-01-16 – 2014-01-17 (×3): 650 mg via ORAL
  Filled 2014-01-16 (×3): qty 2

## 2014-01-16 MED ORDER — PREDNISONE 20 MG PO TABS
30.0000 mg | ORAL_TABLET | Freq: Once | ORAL | Status: AC
  Administered 2014-01-16: 30 mg via ORAL
  Filled 2014-01-16: qty 1

## 2014-01-16 NOTE — Progress Notes (Signed)
PROGRESS NOTE    Kathy Hoover CXK:[id number] DOB: 1915/11/21 DOA: 01/13/2014 PCP: Gavin Pound, MD  HPI/Brief narrative 80 year old female with history of chronic diastolic CHF, hypothyroidism, anxiety and depression, hypertension, moderate aortic stenosis, sent from PCPs office to ED on 01/13/14 for complaints of productive cough, dyspnea, fatigue, positive flu swab and hypoxia with oxygen saturations of 83% on room air. In the ED, chest x-ray suggested left lower lobe infiltrate.  Assessment/Plan:  1. Acute respiratory failure: Secondary to influenza related acute bronchitis and pneumonia. Treat underlying cause and supportively with oxygen and when necessary bronchodilators. Monitor closely. Still some wheezing and hypoxia. Repeat chest x-ray without significant new findings. Prednisone 30 mg PO x1 dose. 2. Influenza related acute bronchitis: Complete course of Tamiflu. Single dose of prednisone. Scheduled Tylenol for 24 hour secondary to right-sided chest pain from coughing. 3. Left lower lobe pneumonia: Patient was started empirically on Levaquin and vancomycin. Dyspnea and cough have improved. DC vancomycin. Continue levofloxacin. 4. Chronic diastolic CHF: Compensated clinically. 5. Thrombocytopenia: Fluctuating. Outpatient followup. 6. Hypertension: Controlled 7. Dysphasias to solids: Speech therapy recommends continued regular diet and thin liquids.   Code Status: Full Family Communication: None at bedside. Disposition Plan: Return to independent living facility when medically stable-possibly next 24-48 hours. Not medically ready for discharge today.   Consultants:  None  Procedures:  None  Antibiotics:  Oral levofloxacin 3/4 >  IV vancomycin 3/4 > 3/7   Subjective: Right lower anterior rib cage pain with coughing. Cough has improved with occasional yellow sputum. Denies dyspnea. Still feels weak.  Objective: Filed Vitals:   01/15/14 2100 01/16/14 0514 01/16/14 0943  01/16/14 1308  BP: 124/60 134/63  146/72  Pulse: 60 61  46  Temp: 98.5 F (36.9 C) 98.8 F (37.1 C)  98.3 F (36.8 C)  TempSrc: Oral Oral  Oral  Resp: 18 18  17   Height:      Weight:  79.7 kg (175 lb 11.3 oz)    SpO2: 97% 96% 90% 99%    Intake/Output Summary (Last 24 hours) at 01/16/14 1328 Last data filed at 01/16/14 0116  Gross per 24 hour  Intake    360 ml  Output    200 ml  Net    160 ml   Filed Weights   01/14/14 0500 01/15/14 0358 01/16/14 0514  Weight: 80.2 kg (176 lb 12.9 oz) 78.3 kg (172 lb 9.9 oz) 79.7 kg (175 lb 11.3 oz)     Exam:  General exam: Pleasant elderly female seen sitting up comfortably in bed. Respiratory system: Fair breath sounds bilaterally with scattered bilateral few rhonchi and occasional basal crackles. No increased work of breathing. Cardiovascular system: S1 & S2 heard, RRR. No JVD, gallops, clicks or pedal edema. 2 x 5 systolic ejection murmur at apex. Gastrointestinal system: Abdomen is nondistended, soft and nontender. Normal bowel sounds heard. Central nervous system: Alert and oriented. No focal neurological deficits. Extremities: Symmetric 5 x 5 power.   Data Reviewed: Basic Metabolic Panel:  Recent Labs Lab 01/13/14 1342  NA 139  K 4.3  CL 97  CO2 25  GLUCOSE 107*  BUN 19  CREATININE 0.94  CALCIUM 9.4   Liver Function Tests: No results found for this basename: AST, ALT, ALKPHOS, BILITOT, PROT, ALBUMIN,  in the last 168 hours No results found for this basename: LIPASE, AMYLASE,  in the last 168 hours No results found for this basename: AMMONIA,  in the last 168 hours CBC:  Recent Labs Lab 01/13/14  1342 01/14/14 1834 01/16/14 0615  WBC 5.1 5.6 5.2  NEUTROABS 3.1  --   --   HGB 12.2 11.4* 11.4*  HCT 36.5 34.8* 34.1*  MCV 93.4 94.6 93.4  PLT 94* 113* 92*   Cardiac Enzymes: No results found for this basename: CKTOTAL, CKMB, CKMBINDEX, TROPONINI,  in the last 168 hours BNP (last 3 results)  Recent Labs   01/13/14 1343  PROBNP 3487.0*   CBG: No results found for this basename: GLUCAP,  in the last 168 hours  Recent Results (from the past 240 hour(s))  CULTURE, BLOOD (ROUTINE X 2)     Status: None   Collection Time    01/13/14  1:55 PM      Result Value Ref Range Status   Specimen Description BLOOD LEFT ANTECUBITAL   Final   Special Requests BOTTLES DRAWN AEROBIC AND ANAEROBIC 5CC   Final   Culture  Setup Time     Final   Value: 01/13/2014 16:37     Performed at [facility]   Culture     Final   Value:        BLOOD CULTURE RECEIVED NO GROWTH TO DATE CULTURE WILL BE HELD FOR 5 DAYS BEFORE ISSUING A FINAL NEGATIVE REPORT     Performed at [facility]   Report Status PENDING   Incomplete  CULTURE, BLOOD (ROUTINE X 2)     Status: None   Collection Time    01/13/14  2:00 PM      Result Value Ref Range Status   Specimen Description BLOOD LEFT HAND   Final   Special Requests BOTTLES DRAWN AEROBIC ONLY 5CC   Final   Culture  Setup Time     Final   Value: 01/13/2014 16:38     Performed at [facility]   Culture     Final   Value:        BLOOD CULTURE RECEIVED NO GROWTH TO DATE CULTURE WILL BE HELD FOR 5 DAYS BEFORE ISSUING A FINAL NEGATIVE REPORT     Performed at [facility]   Report Status PENDING   Incomplete       Studies: Dg Chest 2 View  01/16/2014   CLINICAL DATA:  Pneumonia with chest pain.  EXAM: CHEST  2 VIEW  COMPARISON:  01/13/2014  FINDINGS: Two views demonstrate cardiomegaly. Enlarged streaky densities in the posterior chest on the lateral view. The location of the streaky densities are unknown on the AP view. No definite pleural effusions. Trachea is midline. Again noted are surgical clips in the right axilla or breast. Densities in the medial right lung base.  IMPRESSION: Enlarged streaky densities in the posterior chest on the lateral view. Findings could represent atelectasis but infection cannot be excluded. Densities in the medial right  lung base could represent infection or atelectasis. Recommend continued follow-up.  Cardiomegaly.   Electronically Signed   By: Markus Daft M.D.   On: 01/16/2014 11:24        Scheduled Meds: . acetaminophen  650 mg Oral TID  . cholecalciferol  2,000 Units Oral Daily  . escitalopram  5 mg Oral Daily  . furosemide  40 mg Oral Daily  . guaiFENesin  600 mg Oral BID  . heparin  5,000 Units Subcutaneous 3 times per day  . ipratropium-albuterol  3 mL Nebulization TID  . levofloxacin  250 mg Oral Q24H  . levothyroxine  88 mcg Oral QAC breakfast  . multivitamin with minerals  1 tablet Oral Daily  . multivitamin-lutein  1 capsule Oral Daily  . oseltamivir  30 mg Oral BID  . pindolol  5 mg Oral Daily  . potassium chloride  10 mEq Oral Daily  . sodium chloride  3 mL Intravenous Q12H  . vitamin C  500 mg Oral Daily   Continuous Infusions:   Principal Problem:   Acute respiratory failure Active Problems:   Essential hypertension, benign   Chronic diastolic heart failure   Obesity, unspecified   Influenza with pneumonia    Time spent: 18 minutes    Jessice Madill, MD, FACP, FHM. [facility] Pager [phone]  If 7PM-7AM, please contact night-coverage [email or web address] Password [email or web address] 01/16/2014, 1:28 PM    LOS: 3 days

## 2014-01-16 NOTE — ED Provider Notes (Signed)
Medical screening examination/treatment/procedure(s) were conducted as a shared visit with non-physician practitioner(s) and myself.  I personally evaluated the patient during the encounter.   EKG Interpretation   Date/Time:  Wednesday January 13 2014 13:49:41 EST Ventricular Rate:  65 PR Interval:  171 QRS Duration: 84 QT Interval:  423 QTC Calculation: 440 R Axis:   -11 Text Interpretation:  Sinus rhythm Abnormal R-wave progression, late  transition Confirmed by Zenia Resides  MD, Chayim Bialas ([id number]) on 01/13/2014 3:17:45 PM       Leota Jacobsen, MD 01/16/14 1550

## 2014-01-17 DIAGNOSIS — J1100 Influenza due to unidentified influenza virus with unspecified type of pneumonia: Secondary | ICD-10-CM | POA: Diagnosis not present

## 2014-01-17 DIAGNOSIS — J9600 Acute respiratory failure, unspecified whether with hypoxia or hypercapnia: Secondary | ICD-10-CM | POA: Diagnosis not present

## 2014-01-17 DIAGNOSIS — D696 Thrombocytopenia, unspecified: Secondary | ICD-10-CM | POA: Diagnosis not present

## 2014-01-17 DIAGNOSIS — I5032 Chronic diastolic (congestive) heart failure: Secondary | ICD-10-CM | POA: Diagnosis not present

## 2014-01-17 MED ORDER — PREDNISONE 20 MG PO TABS
30.0000 mg | ORAL_TABLET | Freq: Once | ORAL | Status: AC
  Administered 2014-01-17: 30 mg via ORAL
  Filled 2014-01-17: qty 1

## 2014-01-17 MED ORDER — DIPHENHYDRAMINE HCL 25 MG PO CAPS
25.0000 mg | ORAL_CAPSULE | Freq: Once | ORAL | Status: AC
  Administered 2014-01-17: 25 mg via ORAL
  Filled 2014-01-17: qty 1

## 2014-01-17 MED ORDER — TRAMADOL HCL 50 MG PO TABS
50.0000 mg | ORAL_TABLET | Freq: Once | ORAL | Status: DC
  Filled 2014-01-17: qty 1

## 2014-01-17 MED ORDER — TRAMADOL HCL 50 MG PO TABS
50.0000 mg | ORAL_TABLET | Freq: Once | ORAL | Status: AC
  Administered 2014-01-17: 50 mg via ORAL
  Filled 2014-01-17: qty 1

## 2014-01-17 NOTE — Progress Notes (Signed)
Pt c/o pain. Rogue Bussing, NP given one time dose of ultram 50mg . RN dropped med on floor, wasted with other RN and spoke with Ebony Hail in pharmacy to place order so that RN may remove from pyxis.

## 2014-01-17 NOTE — Progress Notes (Addendum)
PROGRESS NOTE    Kathy Hoover XVQ:[id number] DOB: [birth year] DOA: 01/13/2014 PCP: Gavin Pound, MD  HPI/Brief narrative 80 year old female with history of chronic diastolic CHF, hypothyroidism, anxiety and depression, hypertension, moderate aortic stenosis, sent from PCPs office to ED on 01/13/14 for complaints of productive cough, dyspnea, fatigue, positive flu swab and hypoxia with oxygen saturations of 83% on room air. In the ED, chest x-ray suggested left lower lobe infiltrate.  Assessment/Plan:  1. Acute respiratory failure: Secondary to influenza related acute bronchitis and pneumonia. Treat underlying cause and supportively with oxygen and when necessary bronchodilators. Monitor closely. Still some wheezing and hypoxia. Repeat chest x-ray without significant new findings. Prednisone 30 mg PO x 2 days. Continues to improve. 2. Influenza related acute bronchitis: Complete course of Tamiflu. Short course of prednisone. Scheduled Tylenol for 24 hour secondary to right-sided chest pain from coughing. 3. Left lower lobe pneumonia: Patient was started empirically on Levaquin and vancomycin. Dyspnea and cough have improved. DC vancomycin. Continue levofloxacin. 4. Chronic diastolic CHF: Compensated clinically. 5. Thrombocytopenia: Fluctuating. Outpatient followup. 6. Hypertension: Controlled 7. Dysphasias to solids: Speech therapy recommends continued regular diet and thin liquids.   Code Status: Full Family Communication: Discussed with patient's son Disposition Plan: Return to independent living facility possibly 3/9  Consultants:  None  Procedures:  None  Antibiotics:  Oral levofloxacin 3/4 >  IV vancomycin 3/4 > 3/7   Subjective: Feels much better. Decreasing right lower anterior chest pain, cough and denies dyspnea.  Objective: Filed Vitals:   01/16/14 2110 01/17/14 0510 01/17/14 0917 01/17/14 1316  BP: 151/72 155/67  156/72  Pulse: 56 53  57  Temp: 98 F (36.7 C)  97.6 F (36.4 C)  98.6 F (37 C)  TempSrc: Oral Oral  Oral  Resp: 18 18  18   Height:      Weight:  79 kg (174 lb 2.6 oz)    SpO2: 95% 96% 97% 98%    Intake/Output Summary (Last 24 hours) at 01/17/14 1754 Last data filed at 01/17/14 1740  Gross per 24 hour  Intake    600 ml  Output    900 ml  Net   -300 ml   Filed Weights   01/15/14 0358 01/16/14 0514 01/17/14 0510  Weight: 78.3 kg (172 lb 9.9 oz) 79.7 kg (175 lb 11.3 oz) 79 kg (174 lb 2.6 oz)     Exam:  General exam: Pleasant elderly female seen sitting up comfortably on chair. Respiratory system: Fair breath sounds bilaterally with scattered bilateral few rhonchi-decreased compared to yesterday, and occasional basal crackles. No increased work of breathing. Cardiovascular system: S1 & S2 heard, RRR. No JVD, gallops, clicks or pedal edema. 2 x 5 systolic ejection murmur at apex. Gastrointestinal system: Abdomen is nondistended, soft and nontender. Normal bowel sounds heard. Central nervous system: Alert and oriented. No focal neurological deficits. Extremities: Symmetric 5 x 5 power.   Data Reviewed: Basic Metabolic Panel:  Recent Labs Lab 01/13/14 1342  NA 139  K 4.3  CL 97  CO2 25  GLUCOSE 107*  BUN 19  CREATININE 0.94  CALCIUM 9.4   Liver Function Tests: No results found for this basename: AST, ALT, ALKPHOS, BILITOT, PROT, ALBUMIN,  in the last 168 hours No results found for this basename: LIPASE, AMYLASE,  in the last 168 hours No results found for this basename: AMMONIA,  in the last 168 hours CBC:  Recent Labs Lab 01/13/14 1342 01/14/14 1834 01/16/14 0615  WBC 5.1 5.6 5.2  NEUTROABS 3.1  --   --   HGB 12.2 11.4* 11.4*  HCT 36.5 34.8* 34.1*  MCV 93.4 94.6 93.4  PLT 94* 113* 92*   Cardiac Enzymes: No results found for this basename: CKTOTAL, CKMB, CKMBINDEX, TROPONINI,  in the last 168 hours BNP (last 3 results)  Recent Labs  01/13/14 1343  PROBNP 3487.0*   CBG: No results found for this  basename: GLUCAP,  in the last 168 hours  Recent Results (from the past 240 hour(s))  CULTURE, BLOOD (ROUTINE X 2)     Status: None   Collection Time    01/13/14  1:55 PM      Result Value Ref Range Status   Specimen Description BLOOD LEFT ANTECUBITAL   Final   Special Requests BOTTLES DRAWN AEROBIC AND ANAEROBIC 5CC   Final   Culture  Setup Time     Final   Value: 01/13/2014 16:37     Performed at [facility]   Culture     Final   Value:        BLOOD CULTURE RECEIVED NO GROWTH TO DATE CULTURE WILL BE HELD FOR 5 DAYS BEFORE ISSUING A FINAL NEGATIVE REPORT     Performed at [facility]   Report Status PENDING   Incomplete  CULTURE, BLOOD (ROUTINE X 2)     Status: None   Collection Time    01/13/14  2:00 PM      Result Value Ref Range Status   Specimen Description BLOOD LEFT HAND   Final   Special Requests BOTTLES DRAWN AEROBIC ONLY 5CC   Final   Culture  Setup Time     Final   Value: 01/13/2014 16:38     Performed at [facility]   Culture     Final   Value:        BLOOD CULTURE RECEIVED NO GROWTH TO DATE CULTURE WILL BE HELD FOR 5 DAYS BEFORE ISSUING A FINAL NEGATIVE REPORT     Performed at [facility]   Report Status PENDING   Incomplete       Studies: Dg Chest 2 View  01/16/2014   CLINICAL DATA:  Pneumonia with chest pain.  EXAM: CHEST  2 VIEW  COMPARISON:  01/13/2014  FINDINGS: Two views demonstrate cardiomegaly. Enlarged streaky densities in the posterior chest on the lateral view. The location of the streaky densities are unknown on the AP view. No definite pleural effusions. Trachea is midline. Again noted are surgical clips in the right axilla or breast. Densities in the medial right lung base.  IMPRESSION: Enlarged streaky densities in the posterior chest on the lateral view. Findings could represent atelectasis but infection cannot be excluded. Densities in the medial right lung base could represent infection or atelectasis. Recommend  continued follow-up.  Cardiomegaly.   Electronically Signed   By: Markus Daft M.D.   On: 01/16/2014 11:24        Scheduled Meds: . cholecalciferol  2,000 Units Oral Daily  . escitalopram  5 mg Oral Daily  . furosemide  40 mg Oral Daily  . guaiFENesin  600 mg Oral BID  . heparin  5,000 Units Subcutaneous 3 times per day  . ipratropium-albuterol  3 mL Nebulization TID  . levofloxacin  250 mg Oral Q24H  . levothyroxine  88 mcg Oral QAC breakfast  . multivitamin with minerals  1 tablet Oral Daily  . multivitamin-lutein  1 capsule Oral Daily  . oseltamivir  30 mg Oral  BID  . pindolol  5 mg Oral Daily  . potassium chloride  10 mEq Oral Daily  . sodium chloride  3 mL Intravenous Q12H  . vitamin C  500 mg Oral Daily   Continuous Infusions:   Principal Problem:   Acute respiratory failure Active Problems:   Essential hypertension, benign   Chronic diastolic heart failure   Obesity, unspecified   Influenza with pneumonia    Time spent: 8 minutes    Eshika Reckart, MD, FACP, FHM. [facility] Pager [phone]  If 7PM-7AM, please contact night-coverage [email or web address] Password [email or web address] 01/17/2014, 5:54 PM    LOS: 4 days

## 2014-01-17 NOTE — Progress Notes (Signed)
Pt ambulated from room to far end of nurse's station.  Started at 98% on 3L [place] while ambulating.  When oxygen turned down to 2L, dropped to 96-97%; while on 1L, down to 95-96%.  Once pt reached 0.5L and closer to the end of walk, pt became SOB and tired.  Oxygen saturations were around 93-94%.  Returned oxygen to 2L and pt returned to 97%. When resting, pt on 1L [place] 96%.  No dyspnea at rest.

## 2014-01-17 NOTE — Progress Notes (Signed)
RN concerned about pt platelet count with heparin VTE. Spoke with Marya Amsler in pharmacy who stated it would be ok to give heparin to pt.

## 2014-01-17 NOTE — Progress Notes (Signed)
Pt ambulated in hall with use of 4 wheel walker and assistance of nurse tech from pt room to nurse's station and back w/ no complications or distress.

## 2014-01-18 DIAGNOSIS — D696 Thrombocytopenia, unspecified: Secondary | ICD-10-CM | POA: Diagnosis not present

## 2014-01-18 DIAGNOSIS — I5032 Chronic diastolic (congestive) heart failure: Secondary | ICD-10-CM | POA: Diagnosis not present

## 2014-01-18 DIAGNOSIS — J1100 Influenza due to unidentified influenza virus with unspecified type of pneumonia: Secondary | ICD-10-CM | POA: Diagnosis not present

## 2014-01-18 DIAGNOSIS — J9600 Acute respiratory failure, unspecified whether with hypoxia or hypercapnia: Secondary | ICD-10-CM | POA: Diagnosis not present

## 2014-01-18 MED ORDER — ACETAMINOPHEN 325 MG PO TABS: 650.0000 mg | ORAL_TABLET | Freq: Four times a day (QID) | ORAL | Status: DC | PRN

## 2014-01-18 MED ORDER — GUAIFENESIN-DM 100-10 MG/5ML PO SYRP: 5.0000 mL | ORAL_SOLUTION | ORAL | Status: DC | PRN

## 2014-01-18 MED ORDER — LEVOFLOXACIN 250 MG PO TABS: 250.0000 mg | ORAL_TABLET | ORAL | Status: DC

## 2014-01-18 MED ORDER — OSELTAMIVIR PHOSPHATE 30 MG PO CAPS: 30.0000 mg | ORAL_CAPSULE | Freq: Once | ORAL | Status: DC

## 2014-01-18 MED ORDER — GUAIFENESIN ER 600 MG PO TB12: 600.0000 mg | ORAL_TABLET | Freq: Two times a day (BID) | ORAL | Status: DC

## 2014-01-18 MED ORDER — ALBUTEROL SULFATE HFA 108 (90 BASE) MCG/ACT IN AERS: 1.0000 | INHALATION_SPRAY | Freq: Four times a day (QID) | RESPIRATORY_TRACT | Status: DC | PRN

## 2014-01-18 NOTE — Care Management Note (Signed)
    Page 1 of 1   01/18/2014     3:02:56 PM   CARE MANAGEMENT NOTE 01/18/2014  Patient:  Kathy Hoover, Kathy Hoover   Account Number:  [id number]  Date Initiated:  01/18/2014  Documentation initiated by:  Tomi Bamberger  Subjective/Objective Assessment:   dx acute resp failure  admit- from indep living     Action/Plan:   Anticipated DC Date:  01/18/2014   Anticipated DC Plan:  [facility]  CM consult      Choice offered to / List presented to:             Status of service:  Completed, signed off Medicare Important Message given?   (If response is "NO", the following Medicare IM given date fields will be blank) Date Medicare IM given:   Date Additional Medicare IM given:    Discharge Disposition:  HOME/SELF CARE  Per UR Regulation:  Reviewed for med. necessity/level of care/duration of stay  If discussed at [place] of Stay Meetings, dates discussed:    Comments:  01/18/14 [place], BSN [phone] patient for dc back to indep living, family members here to transport her, patient saturations did not qualify her for oxygen.

## 2014-01-18 NOTE — Progress Notes (Signed)
SATURATION QUALIFICATIONS: (This note is used to comply with regulatory documentation for home oxygen)  Patient Saturations on Room Air at Rest = 89%  Patient Saturations on Room Air while Ambulating = 89%  Please briefly explain why patient needs home oxygen:Spoke with Neoma Laming, resident is stable at 89% RA and ambulating.

## 2014-01-18 NOTE — Progress Notes (Signed)
Nsg Discharge Note  Admit Date:  01/13/2014 Discharge date: 01/18/2014   Kathy Hoover to be D/C'd Home ([facility]) per MD order.  AVS completed.  Copy for chart, and copy for patient signed, and dated. Patient/caregiver able to verbalize understanding.  Discharge Medication:   Medication List         acetaminophen 325 MG tablet  Commonly known as:  TYLENOL  Take 2 tablets (650 mg total) by mouth every 6 (six) hours as needed (pain or Fever >/= 101).     albuterol 108 (90 BASE) MCG/ACT inhaler  Commonly known as:  PROVENTIL HFA;VENTOLIN HFA  Inhale 1-2 puffs into the lungs every 6 (six) hours as needed for wheezing or shortness of breath.     escitalopram 10 MG tablet  Commonly known as:  LEXAPRO  Take 5 mg by mouth daily.     furosemide 40 MG tablet  Commonly known as:  LASIX  Take 40 mg by mouth.     guaiFENesin 600 MG 12 hr tablet  Commonly known as:  MUCINEX  Take 1 tablet (600 mg total) by mouth 2 (two) times daily.     guaiFENesin-dextromethorphan 100-10 MG/5ML syrup  Commonly known as:  ROBITUSSIN DM  Take 5 mLs by mouth every 4 (four) hours as needed for cough.     levofloxacin 250 MG tablet  Commonly known as:  LEVAQUIN  Take 1 tablet (250 mg total) by mouth daily.     levothyroxine 88 MCG tablet  Commonly known as:  SYNTHROID, LEVOTHROID  Take 88 mcg by mouth daily.     MAGNESIUM PO  Take 1 tablet by mouth daily as needed (restless leg).     multivitamin with minerals Tabs tablet  Take 1 tablet by mouth daily.     oseltamivir 30 MG capsule  Commonly known as:  TAMIFLU  Take 1 capsule (30 mg total) by mouth once. On 01/18/2014 at 10 PM.     pindolol 5 MG tablet  Commonly known as:  VISKEN  Take 5 mg by mouth daily.     potassium chloride 10 MEQ tablet  Commonly known as:  K-DUR  Take 10 mEq by mouth daily.     PRESERVISION AREDS PO  Take 1 capsule by mouth daily.     SYSTANE OP  Apply 1-2 drops to eye 4 (four) times daily as needed (dry  eyes).     vitamin C 500 MG tablet  Commonly known as:  ASCORBIC ACID  Take 500 mg by mouth daily.     Vitamin D3 2000 UNITS Chew  Chew 2,000 mg by mouth daily.        Discharge Assessment: Filed Vitals:   01/18/14 1352  BP: 128/69  Pulse: 56  Temp: 98 F (36.7 C)  Resp: 18   Skin clean, dry and intact without evidence of skin break down, no evidence of skin tears noted. IV catheter discontinued intact. Site without signs and symptoms of complications - no redness or edema noted at insertion site, patient denies c/o pain - only slight tenderness at site.  Dressing with slight pressure applied.  D/c Instructions-Education: Discharge instructions given to patient/family with verbalized understanding. D/c education completed with patient/family including follow up instructions, medication list, d/c activities limitations if indicated, with other d/c instructions as indicated by MD - patient able to verbalize understanding, all questions fully answered. Patient instructed to return to ED, call 911, or call MD for any changes in condition.  Patient escorted via [place],  and D/C home via private auto.  Dayle Points, RN 01/18/2014 3:33 PM

## 2014-01-18 NOTE — Discharge Summary (Signed)
Physician Discharge Summary  Kathy Hoover [id number] DOB: 24-Jun-1916 DOA: 01/13/2014  PCP: Gavin Pound, MD  Admit date: 01/13/2014 Discharge date: 01/18/2014  Time spent: Less than 30 minutes  Recommendations for Outpatient Follow-up:  1. Dr. Gavin Pound, PCP in 3 days with repeat labs (CBC). 2. Follow final blood culture results that were sent from the hospital. 3. Will need followup chest x-ray in 4-6 weeks to ensure clearing of pneumonia.  Discharge Diagnoses:  Principal Problem:   Acute respiratory failure Active Problems:   Essential hypertension, benign   Chronic diastolic heart failure   Obesity, unspecified   Influenza with pneumonia   Discharge Condition: Improved & Stable  Diet recommendation: Heart Healthy diet.  Filed Weights   01/16/14 0514 01/17/14 0510 01/18/14 0602  Weight: 79.7 kg (175 lb 11.3 oz) 79 kg (174 lb 2.6 oz) 78.7 kg (173 lb 8 oz)    History of present illness:  80 year old female with history of chronic diastolic CHF, hypothyroidism, anxiety and depression, hypertension, moderate aortic stenosis, sent from PCPs office to ED on 01/13/14 for complaints of productive cough, dyspnea, fatigue, positive flu swab and hypoxia with oxygen saturations of 83% on room air. In the ED, chest x-ray suggested left lower lobe infiltrate.  Hospital Course:   1. Acute respiratory failure: Secondary to influenza related acute bronchitis and pneumonia. Treated underlying cause and supportively with oxygen and when necessary bronchodilators. Repeat chest x-ray without significant new findings. Prednisone 30 mg PO x 2 days. Improved. Oxygen saturations 89% on room air at rest and with activity. 2. Influenza related acute bronchitis: Completes course of Tamiflu tonight. Short course of prednisone x2 days. Improving. When necessary albuterol inhaler at discharge. 3. Left lower lobe pneumonia: Patient was started empirically on Levaquin and vancomycin. Dyspnea and cough have  improved. DC vancomycin. Continue levofloxacin and complete total 7 days course. 4. Chronic diastolic CHF: Compensated clinically. 5. Anemia and Thrombocytopenia: Hemoglobin stable. Platelet count fluctuating. No bleeding. Outpatient followup.? Etiology. 6. Hypertension: Controlled 7. Dysphasias to solids: Speech therapy recommends continued regular diet and thin liquids.   Consultations:  None  Procedures:  None    Discharge Exam:  Complaints: Patient states that she slept well last night and continues to feel better and is anxious to go home. Denies dyspnea. Cough has progressively decreased and is minimal with intermittent yellow sputum. Right lower anterior rib cage pain has also substantially decreased.  Filed Vitals:   01/18/14 1027 01/18/14 1028 01/18/14 1029 01/18/14 1352  BP:    128/69  Pulse:    56  Temp:    98 F (36.7 C)  TempSrc:    Oral  Resp:    18  Height:      Weight:      SpO2: 95% 89% 89% 95%    General exam: Pleasant elderly female lying comfortably in bed.  Respiratory system: Good air entry bilaterally. Improved breath sounds compared to the last 2 days. Occasional posterior rhonchi and basal crackles. No increased work of breathing.  Cardiovascular system: S1 & S2 heard, RRR. No JVD, gallops, clicks or pedal edema. 2 x 5 systolic ejection murmur at apex.  Gastrointestinal system: Abdomen is nondistended, soft and nontender. Normal bowel sounds heard.  Central nervous system: Alert and oriented. No focal neurological deficits.  Extremities: Symmetric 5 x 5 power  Discharge Instructions      Discharge Orders   Future Appointments Provider Department Dept Phone   01/29/2014 10:30 AM Sueanne Margarita, MD [address] [phone]   03/04/2014 11:30 AM [facility] [address] [id number]   Future Orders Complete By Expires   (HEART FAILURE PATIENTS) Call MD:  Anytime you have any of the following  symptoms: 1) 3 pound weight gain in 24 hours or 5 pounds in 1 week 2) shortness of breath, with or without a dry hacking cough 3) swelling in the hands, feet or stomach 4) if you have to sleep on extra pillows at night in order to breathe.  As directed    Call MD for:  difficulty breathing, headache or visual disturbances  As directed    Call MD for:  severe uncontrolled pain  As directed    Call MD for:  As directed    Diet - low sodium heart healthy  As directed    Increase activity slowly  As directed        Medication List         acetaminophen 325 MG tablet  Commonly known as:  TYLENOL  Take 2 tablets (650 mg total) by mouth every 6 (six) hours as needed (pain or Fever >/= 101).     albuterol 108 (90 BASE) MCG/ACT inhaler  Commonly known as:  PROVENTIL HFA;VENTOLIN HFA  Inhale 1-2 puffs into the lungs every 6 (six) hours as needed for wheezing or shortness of breath.     escitalopram 10 MG tablet  Commonly known as:  LEXAPRO  Take 5 mg by mouth daily.     furosemide 40 MG tablet  Commonly known as:  LASIX  Take 40 mg by mouth.     guaiFENesin 600 MG 12 hr tablet  Commonly known as:  MUCINEX  Take 1 tablet (600 mg total) by mouth 2 (two) times daily.     guaiFENesin-dextromethorphan 100-10 MG/5ML syrup  Commonly known as:  ROBITUSSIN DM  Take 5 mLs by mouth every 4 (four) hours as needed for cough.     levofloxacin 250 MG tablet  Commonly known as:  LEVAQUIN  Take 1 tablet (250 mg total) by mouth daily.     levothyroxine 88 MCG tablet  Commonly known as:  SYNTHROID, LEVOTHROID  Take 88 mcg by mouth daily.     MAGNESIUM PO  Take 1 tablet by mouth daily as needed (restless leg).     multivitamin with minerals Tabs tablet  Take 1 tablet by mouth daily.     oseltamivir 30 MG capsule  Commonly known as:  TAMIFLU  Take 1 capsule (30 mg total) by mouth once. On 01/18/2014 at 10 PM.     pindolol 5 MG tablet  Commonly known as:  VISKEN  Take 5 mg by mouth daily.      potassium chloride 10 MEQ tablet  Commonly known as:  K-DUR  Take 10 mEq by mouth daily.     PRESERVISION AREDS PO  Take 1 capsule by mouth daily.     SYSTANE OP  Apply 1-2 drops to eye 4 (four) times daily as needed (dry eyes).     vitamin C 500 MG tablet  Commonly known as:  ASCORBIC ACID  Take 500 mg by mouth daily.     Vitamin D3 2000 UNITS Chew  Chew 2,000 mg by mouth daily.       Follow-up Information   Follow up with NNODI, ADAKU, MD. Schedule an appointment as soon as possible for a visit in 3 days. (To be seen with repeat labs (CBC))  Specialty:  Family Medicine   Contact information:   [address] [place] [address] [phone]        The results of significant diagnostics from this hospitalization (including imaging, microbiology, ancillary and laboratory) are listed below for reference.    Significant Diagnostic Studies: Dg Chest 2 View  01/16/2014   CLINICAL DATA:  Pneumonia with chest pain.  EXAM: CHEST  2 VIEW  COMPARISON:  01/13/2014  FINDINGS: Two views demonstrate cardiomegaly. Enlarged streaky densities in the posterior chest on the lateral view. The location of the streaky densities are unknown on the AP view. No definite pleural effusions. Trachea is midline. Again noted are surgical clips in the right axilla or breast. Densities in the medial right lung base.  IMPRESSION: Enlarged streaky densities in the posterior chest on the lateral view. Findings could represent atelectasis but infection cannot be excluded. Densities in the medial right lung base could represent infection or atelectasis. Recommend continued follow-up.  Cardiomegaly.   Electronically Signed   By: Markus Daft M.D.   On: 01/16/2014 11:24   Dg Chest 2 View  01/13/2014   CLINICAL DATA:  Cough, flu  EXAM: CHEST  2 VIEW  COMPARISON:  09/21/2012  FINDINGS: Cardiomegaly is noted. Surgical clips in right axilla. Osteopenia and mild degenerative changes thoracic spine. No pulmonary  edema. There is streaky left base retrocardiac atelectasis or infiltrate best seen on lateral view.  IMPRESSION: Streaky left base retrocardiac atelectasis or infiltrate. No pulmonary edema. Cardiomegaly.   Electronically Signed   By: Lahoma Crocker M.D.   On: 01/13/2014 14:39    Microbiology: Recent Results (from the past 240 hour(s))  CULTURE, BLOOD (ROUTINE X 2)     Status: None   Collection Time    01/13/14  1:55 PM      Result Value Ref Range Status   Specimen Description BLOOD LEFT ANTECUBITAL   Final   Special Requests BOTTLES DRAWN AEROBIC AND ANAEROBIC 5CC   Final   Culture  Setup Time     Final   Value: 01/13/2014 16:37     Performed at [facility]   Culture     Final   Value:        BLOOD CULTURE RECEIVED NO GROWTH TO DATE CULTURE WILL BE HELD FOR 5 DAYS BEFORE ISSUING A FINAL NEGATIVE REPORT     Performed at [facility]   Report Status PENDING   Incomplete  CULTURE, BLOOD (ROUTINE X 2)     Status: None   Collection Time    01/13/14  2:00 PM      Result Value Ref Range Status   Specimen Description BLOOD LEFT HAND   Final   Special Requests BOTTLES DRAWN AEROBIC ONLY 5CC   Final   Culture  Setup Time     Final   Value: 01/13/2014 16:38     Performed at [facility]   Culture     Final   Value:        BLOOD CULTURE RECEIVED NO GROWTH TO DATE CULTURE WILL BE HELD FOR 5 DAYS BEFORE ISSUING A FINAL NEGATIVE REPORT     Performed at [facility]   Report Status PENDING   Incomplete     Labs: Basic Metabolic Panel:  Recent Labs Lab 01/13/14 1342  NA 139  K 4.3  CL 97  CO2 25  GLUCOSE 107*  BUN 19  CREATININE 0.94  CALCIUM 9.4   Liver Function Tests: No results  found for this basename: AST, ALT, ALKPHOS, BILITOT, PROT, ALBUMIN,  in the last 168 hours No results found for this basename: LIPASE, AMYLASE,  in the last 168 hours No results found for this basename: AMMONIA,  in the last 168 hours CBC:  Recent Labs Lab 01/13/14 1342  01/14/14 1834 01/16/14 0615  WBC 5.1 5.6 5.2  NEUTROABS 3.1  --   --   HGB 12.2 11.4* 11.4*  HCT 36.5 34.8* 34.1*  MCV 93.4 94.6 93.4  PLT 94* 113* 92*   Cardiac Enzymes: No results found for this basename: CKTOTAL, CKMB, CKMBINDEX, TROPONINI,  in the last 168 hours BNP: BNP (last 3 results)  Recent Labs  01/13/14 1343  PROBNP 3487.0*   CBG: No results found for this basename: GLUCAP,  in the last 168 hours   Signed:  Vernell Leep, MD, FACP, FHM. [facility] Pager [phone]  If 7PM-7AM, please contact night-coverage [email or web address] Password [email or web address] 01/18/2014, 2:28 PM

## 2014-01-19 LAB — CULTURE, BLOOD (ROUTINE X 2)
CULTURE: NO GROWTH
CULTURE: NO GROWTH

## 2014-01-22 DIAGNOSIS — J111 Influenza due to unidentified influenza virus with other respiratory manifestations: Secondary | ICD-10-CM | POA: Diagnosis not present

## 2014-01-22 DIAGNOSIS — R197 Diarrhea, unspecified: Secondary | ICD-10-CM | POA: Diagnosis not present

## 2014-01-22 DIAGNOSIS — D696 Thrombocytopenia, unspecified: Secondary | ICD-10-CM | POA: Diagnosis not present

## 2014-01-22 DIAGNOSIS — J189 Pneumonia, unspecified organism: Secondary | ICD-10-CM | POA: Diagnosis not present

## 2014-01-29 ENCOUNTER — Ambulatory Visit (INDEPENDENT_AMBULATORY_CARE_PROVIDER_SITE_OTHER): Payer: Medicare Other | Admitting: Cardiology

## 2014-01-29 ENCOUNTER — Encounter: Payer: Self-pay | Admitting: Cardiology

## 2014-01-29 VITALS — BP 151/63 | HR 63 | Ht 60.0 in | Wt 171.0 lb

## 2014-01-29 DIAGNOSIS — I359 Nonrheumatic aortic valve disorder, unspecified: Secondary | ICD-10-CM | POA: Diagnosis not present

## 2014-01-29 DIAGNOSIS — I5032 Chronic diastolic (congestive) heart failure: Secondary | ICD-10-CM | POA: Diagnosis not present

## 2014-01-29 DIAGNOSIS — I10 Essential (primary) hypertension: Secondary | ICD-10-CM

## 2014-01-29 DIAGNOSIS — I350 Nonrheumatic aortic (valve) stenosis: Secondary | ICD-10-CM

## 2014-01-29 NOTE — Patient Instructions (Signed)
Your physician recommends that you continue on your current medications as directed. Please refer to the Current Medication list given to you today.  Your physician wants you to follow-up in: 6 months with Dr Turner You will receive a reminder letter in the mail two months in advance. If you don't receive a letter, please call our office to schedule the follow-up appointment.  

## 2014-01-29 NOTE — Progress Notes (Signed)
[place], [place], [place]  [address] Phone: [phone] Fax:  [phone]  Date:  01/29/2014   ID:  Kathy Hoover, DOB 08/12/1916, MRN [id number]  PCP:  Gavin Pound, MD  Cardiologist:  Fransico Him, MD     History of Present Illness: Kathy Hoover is a 80 y.o. female with a history of chronic diastolic CHF, HTN and chronic LE edema who presents today for followup. She was recently admitted to [facility] with influenza and PNA and is doing much better now.   She denies any chest pain, dizziness, LE edema, palpitations or syncope.  She still has some residual DOE and cough but it is much improved.     Wt Readings from Last 3 Encounters:  01/29/14 171 lb (77.565 kg)  01/18/14 173 lb 8 oz (78.7 kg)  01/01/14 179 lb (81.194 kg)     Past Medical History  Diagnosis Date  . Lower extremity edema     CHRONIC  . Hypothyroidism   . Anxiety and depression   . Hypertension   . Moderate aortic stenosis 05/2012    by ECHO  . Cervical disc disease     W radiculopathy S/P epidural injections  . Vitamin D deficiency   . Macular degeneration     Dr Hillis Range Follows for pt.  . Anxiety   . Depression   . Exertional shortness of breath     "worse recently" (01/13/2014)  . Pneumonia     "once before today" (01/13/2014)  . Bacterial pneumonia 01/13/2014  . Chronic bronchitis     "@ least once a year;  in the winter" (01/13/2014)  . Breast cancer   . Arthritis     "terribly in my hands" (01/13/2014)  . Chronic diastolic CHF (congestive heart failure)     Preserved EF    Current Outpatient Prescriptions  Medication Sig Dispense Refill  . acetaminophen (TYLENOL) 325 MG tablet Take 2 tablets (650 mg total) by mouth every 6 (six) hours as needed (pain or Fever >/= 101).      Marland Kitchen albuterol (PROVENTIL HFA;VENTOLIN HFA) 108 (90 BASE) MCG/ACT inhaler Inhale 1-2 puffs into the lungs every 6 (six) hours as needed for wheezing or shortness of breath.  1 Inhaler  0  . Cholecalciferol (VITAMIN D3) 2000  UNITS CHEW Chew 2,000 mg by mouth daily.      Marland Kitchen escitalopram (LEXAPRO) 10 MG tablet Take 5 mg by mouth daily.      . furosemide (LASIX) 40 MG tablet Take 40 mg by mouth.      . levofloxacin (LEVAQUIN) 250 MG tablet Take 1 tablet (250 mg total) by mouth daily.  2 tablet  0  . levothyroxine (SYNTHROID, LEVOTHROID) 88 MCG tablet Take 88 mcg by mouth daily.      Marland Kitchen MAGNESIUM PO Take 1 tablet by mouth daily as needed (restless leg).      . Multiple Vitamin (MULTIVITAMIN WITH MINERALS) TABS Take 1 tablet by mouth daily.      . Multiple Vitamins-Minerals (PRESERVISION AREDS PO) Take 1 capsule by mouth daily.      . pindolol (VISKEN) 5 MG tablet Take 5 mg by mouth daily.      Vladimir Faster Glycol-Propyl Glycol (SYSTANE OP) Apply 1-2 drops to eye 4 (four) times daily as needed (dry eyes).      . potassium chloride (K-DUR) 10 MEQ tablet Take 10 mEq by mouth daily.      . vitamin C (ASCORBIC ACID) 500 MG tablet  Take 500 mg by mouth daily.       No current facility-administered medications for this visit.    Allergies:    Allergies  Allergen Reactions  . Codeine     Lip Swelling  . Penicillins     Lip swelling    Social History:  The patient  reports that she has never smoked. She has never used smokeless tobacco. She reports that she does not drink alcohol or use illicit drugs.   Family History:  The patient's family history includes CVA in her brother and father; Multiple sclerosis in her brother; Pneumonia in her father.   ROS:  Please see the history of present illness.      All other systems reviewed and negative.   PHYSICAL EXAM: VS:  BP 151/63  Pulse 63  Ht 5' (1.524 m)  Wt 171 lb (77.565 kg)  BMI 33.40 kg/m2 Well nourished, well developed, in no acute distress HEENT: normal Neck: no JVD Cardiac:  normal S1, S2; RRR; 2/6 SM at RUSB to LLSB Lungs:  clear to auscultation bilaterally, no wheezing, rhonchi or rales Abd: soft, nontender, no hepatomegaly Ext: no edema Skin: warm and  dry Neuro:  CNs 2-12 intact, no focal abnormalities noted  EKG:    NSR with anterior infarct   ASSESSMENT AND PLAN:  1. Chronic diastolic CHF - appears euvolemic - continue Lasix/Pindolol 2. HTN - well controlled - continue Pindolol 3. Chronic LE edema - resolved - continue Lasix and TED hose stockings as needed  Followup with me in 6 months  Signed, Fransico Him, MD 01/29/2014 10:54 AM

## 2014-02-08 ENCOUNTER — Encounter: Payer: Self-pay | Admitting: Podiatry

## 2014-02-08 ENCOUNTER — Ambulatory Visit (INDEPENDENT_AMBULATORY_CARE_PROVIDER_SITE_OTHER): Payer: Medicare Other | Admitting: Podiatry

## 2014-02-08 VITALS — BP 136/68 | HR 64 | Resp 12

## 2014-02-08 DIAGNOSIS — L600 Ingrowing nail: Secondary | ICD-10-CM

## 2014-02-08 DIAGNOSIS — M79609 Pain in unspecified limb: Secondary | ICD-10-CM

## 2014-02-08 NOTE — Patient Instructions (Signed)
Where open toe compression hose on the right and left legs, to avoid compression on toenails.

## 2014-02-08 NOTE — Progress Notes (Signed)
   Subjective:    Patient ID: Kathy Hoover, female    DOB: Jan 18, 1916, 80 y.o.   MRN: [id number]  HPI PT STATED BOTH GREAT TOENAILS ARE A LITTLE SORE FOR 2 MONTHS. THE TOES ARE GETTING WORSE. THE TOENAILS GET AGGRAVATED BY PRESSURE. TRIED NO TREATMENT. AL SO TOENAILS NEED TO BE TRIM.  The hallux nails are uncomfortable when wearing compression hose.    Review of Systems  Eyes: Positive for visual disturbance.  Cardiovascular: Positive for leg swelling.  All other systems reviewed and are negative.       Objective:   Physical Exam Orientated x3 space female presents with son  Vascular: DP is are 2/4 bilaterally. PTs are 1/4 bilaterally  Neurological: Sensation to 10 g monofilament wire intact 5/5 bilaterally. Vibratory sensation nonreactive bilaterally. Ankle reflexes reactive bilaterally.  Dermatological: Mild atrophic skin without a hair growth noted bilaterally. The nails x10 are somewhat incurvated without any texture color changes.  Musculoskeletal hammertoe deformities noted left        Assessment & Plan:   Assessment: Incurvated hallux nails bilaterally aggravated with compression hose Non dystrophic toenails x10  Plan: All 10 toenails are debrided without any bleeding.

## 2014-02-09 ENCOUNTER — Encounter: Payer: Self-pay | Admitting: Podiatry

## 2014-02-16 DIAGNOSIS — M25519 Pain in unspecified shoulder: Secondary | ICD-10-CM | POA: Diagnosis not present

## 2014-02-16 DIAGNOSIS — M6281 Muscle weakness (generalized): Secondary | ICD-10-CM | POA: Diagnosis not present

## 2014-02-16 DIAGNOSIS — R262 Difficulty in walking, not elsewhere classified: Secondary | ICD-10-CM | POA: Diagnosis not present

## 2014-02-16 DIAGNOSIS — Z9181 History of falling: Secondary | ICD-10-CM | POA: Diagnosis not present

## 2014-02-18 DIAGNOSIS — R262 Difficulty in walking, not elsewhere classified: Secondary | ICD-10-CM | POA: Diagnosis not present

## 2014-02-18 DIAGNOSIS — M25519 Pain in unspecified shoulder: Secondary | ICD-10-CM | POA: Diagnosis not present

## 2014-02-18 DIAGNOSIS — Z9181 History of falling: Secondary | ICD-10-CM | POA: Diagnosis not present

## 2014-02-18 DIAGNOSIS — M6281 Muscle weakness (generalized): Secondary | ICD-10-CM | POA: Diagnosis not present

## 2014-02-19 DIAGNOSIS — M6281 Muscle weakness (generalized): Secondary | ICD-10-CM | POA: Diagnosis not present

## 2014-02-19 DIAGNOSIS — R262 Difficulty in walking, not elsewhere classified: Secondary | ICD-10-CM | POA: Diagnosis not present

## 2014-02-19 DIAGNOSIS — M25519 Pain in unspecified shoulder: Secondary | ICD-10-CM | POA: Diagnosis not present

## 2014-02-19 DIAGNOSIS — Z9181 History of falling: Secondary | ICD-10-CM | POA: Diagnosis not present

## 2014-02-23 DIAGNOSIS — M6281 Muscle weakness (generalized): Secondary | ICD-10-CM | POA: Diagnosis not present

## 2014-02-23 DIAGNOSIS — M25519 Pain in unspecified shoulder: Secondary | ICD-10-CM | POA: Diagnosis not present

## 2014-02-23 DIAGNOSIS — Z9181 History of falling: Secondary | ICD-10-CM | POA: Diagnosis not present

## 2014-02-23 DIAGNOSIS — R262 Difficulty in walking, not elsewhere classified: Secondary | ICD-10-CM | POA: Diagnosis not present

## 2014-02-24 DIAGNOSIS — R262 Difficulty in walking, not elsewhere classified: Secondary | ICD-10-CM | POA: Diagnosis not present

## 2014-02-24 DIAGNOSIS — M6281 Muscle weakness (generalized): Secondary | ICD-10-CM | POA: Diagnosis not present

## 2014-02-24 DIAGNOSIS — M25519 Pain in unspecified shoulder: Secondary | ICD-10-CM | POA: Diagnosis not present

## 2014-02-24 DIAGNOSIS — Z9181 History of falling: Secondary | ICD-10-CM | POA: Diagnosis not present

## 2014-02-24 DIAGNOSIS — J189 Pneumonia, unspecified organism: Secondary | ICD-10-CM | POA: Diagnosis not present

## 2014-02-25 DIAGNOSIS — R262 Difficulty in walking, not elsewhere classified: Secondary | ICD-10-CM | POA: Diagnosis not present

## 2014-02-25 DIAGNOSIS — Z9181 History of falling: Secondary | ICD-10-CM | POA: Diagnosis not present

## 2014-02-25 DIAGNOSIS — M6281 Muscle weakness (generalized): Secondary | ICD-10-CM | POA: Diagnosis not present

## 2014-02-25 DIAGNOSIS — M25519 Pain in unspecified shoulder: Secondary | ICD-10-CM | POA: Diagnosis not present

## 2014-02-26 DIAGNOSIS — Z9181 History of falling: Secondary | ICD-10-CM | POA: Diagnosis not present

## 2014-02-26 DIAGNOSIS — M25519 Pain in unspecified shoulder: Secondary | ICD-10-CM | POA: Diagnosis not present

## 2014-02-26 DIAGNOSIS — M6281 Muscle weakness (generalized): Secondary | ICD-10-CM | POA: Diagnosis not present

## 2014-02-26 DIAGNOSIS — R262 Difficulty in walking, not elsewhere classified: Secondary | ICD-10-CM | POA: Diagnosis not present

## 2014-03-01 DIAGNOSIS — R262 Difficulty in walking, not elsewhere classified: Secondary | ICD-10-CM | POA: Diagnosis not present

## 2014-03-01 DIAGNOSIS — Z9181 History of falling: Secondary | ICD-10-CM | POA: Diagnosis not present

## 2014-03-01 DIAGNOSIS — M25519 Pain in unspecified shoulder: Secondary | ICD-10-CM | POA: Diagnosis not present

## 2014-03-01 DIAGNOSIS — M6281 Muscle weakness (generalized): Secondary | ICD-10-CM | POA: Diagnosis not present

## 2014-03-02 DIAGNOSIS — M6281 Muscle weakness (generalized): Secondary | ICD-10-CM | POA: Diagnosis not present

## 2014-03-02 DIAGNOSIS — R262 Difficulty in walking, not elsewhere classified: Secondary | ICD-10-CM | POA: Diagnosis not present

## 2014-03-02 DIAGNOSIS — M25519 Pain in unspecified shoulder: Secondary | ICD-10-CM | POA: Diagnosis not present

## 2014-03-02 DIAGNOSIS — Z9181 History of falling: Secondary | ICD-10-CM | POA: Diagnosis not present

## 2014-03-03 DIAGNOSIS — Z9181 History of falling: Secondary | ICD-10-CM | POA: Diagnosis not present

## 2014-03-03 DIAGNOSIS — R262 Difficulty in walking, not elsewhere classified: Secondary | ICD-10-CM | POA: Diagnosis not present

## 2014-03-03 DIAGNOSIS — M25519 Pain in unspecified shoulder: Secondary | ICD-10-CM | POA: Diagnosis not present

## 2014-03-03 DIAGNOSIS — M6281 Muscle weakness (generalized): Secondary | ICD-10-CM | POA: Diagnosis not present

## 2014-03-04 ENCOUNTER — Ambulatory Visit (HOSPITAL_COMMUNITY): Payer: Medicare Other | Attending: Cardiology | Admitting: Radiology

## 2014-03-04 ENCOUNTER — Other Ambulatory Visit (HOSPITAL_COMMUNITY): Payer: Self-pay | Admitting: Cardiology

## 2014-03-04 DIAGNOSIS — M6281 Muscle weakness (generalized): Secondary | ICD-10-CM | POA: Diagnosis not present

## 2014-03-04 DIAGNOSIS — I359 Nonrheumatic aortic valve disorder, unspecified: Secondary | ICD-10-CM | POA: Insufficient documentation

## 2014-03-04 DIAGNOSIS — M25519 Pain in unspecified shoulder: Secondary | ICD-10-CM | POA: Diagnosis not present

## 2014-03-04 DIAGNOSIS — Z9181 History of falling: Secondary | ICD-10-CM | POA: Diagnosis not present

## 2014-03-04 DIAGNOSIS — R262 Difficulty in walking, not elsewhere classified: Secondary | ICD-10-CM | POA: Diagnosis not present

## 2014-03-04 NOTE — Progress Notes (Signed)
Echocardiogram performed.  

## 2014-03-08 DIAGNOSIS — Z9181 History of falling: Secondary | ICD-10-CM | POA: Diagnosis not present

## 2014-03-08 DIAGNOSIS — R262 Difficulty in walking, not elsewhere classified: Secondary | ICD-10-CM | POA: Diagnosis not present

## 2014-03-08 DIAGNOSIS — M6281 Muscle weakness (generalized): Secondary | ICD-10-CM | POA: Diagnosis not present

## 2014-03-08 DIAGNOSIS — M25519 Pain in unspecified shoulder: Secondary | ICD-10-CM | POA: Diagnosis not present

## 2014-03-09 DIAGNOSIS — Z9181 History of falling: Secondary | ICD-10-CM | POA: Diagnosis not present

## 2014-03-09 DIAGNOSIS — R262 Difficulty in walking, not elsewhere classified: Secondary | ICD-10-CM | POA: Diagnosis not present

## 2014-03-09 DIAGNOSIS — M25519 Pain in unspecified shoulder: Secondary | ICD-10-CM | POA: Diagnosis not present

## 2014-03-09 DIAGNOSIS — M6281 Muscle weakness (generalized): Secondary | ICD-10-CM | POA: Diagnosis not present

## 2014-03-10 DIAGNOSIS — R262 Difficulty in walking, not elsewhere classified: Secondary | ICD-10-CM | POA: Diagnosis not present

## 2014-03-10 DIAGNOSIS — M25519 Pain in unspecified shoulder: Secondary | ICD-10-CM | POA: Diagnosis not present

## 2014-03-10 DIAGNOSIS — M6281 Muscle weakness (generalized): Secondary | ICD-10-CM | POA: Diagnosis not present

## 2014-03-10 DIAGNOSIS — Z9181 History of falling: Secondary | ICD-10-CM | POA: Diagnosis not present

## 2014-03-11 DIAGNOSIS — M6281 Muscle weakness (generalized): Secondary | ICD-10-CM | POA: Diagnosis not present

## 2014-03-11 DIAGNOSIS — Z9181 History of falling: Secondary | ICD-10-CM | POA: Diagnosis not present

## 2014-03-11 DIAGNOSIS — R262 Difficulty in walking, not elsewhere classified: Secondary | ICD-10-CM | POA: Diagnosis not present

## 2014-03-11 DIAGNOSIS — M25519 Pain in unspecified shoulder: Secondary | ICD-10-CM | POA: Diagnosis not present

## 2014-03-12 DIAGNOSIS — H353290 Exudative age-related macular degeneration, unspecified eye, stage unspecified: Secondary | ICD-10-CM | POA: Diagnosis not present

## 2014-03-12 DIAGNOSIS — H35059 Retinal neovascularization, unspecified, unspecified eye: Secondary | ICD-10-CM | POA: Diagnosis not present

## 2014-03-12 DIAGNOSIS — H35039 Hypertensive retinopathy, unspecified eye: Secondary | ICD-10-CM | POA: Diagnosis not present

## 2014-03-15 DIAGNOSIS — M25519 Pain in unspecified shoulder: Secondary | ICD-10-CM | POA: Diagnosis not present

## 2014-03-15 DIAGNOSIS — M6281 Muscle weakness (generalized): Secondary | ICD-10-CM | POA: Diagnosis not present

## 2014-03-15 DIAGNOSIS — Z9181 History of falling: Secondary | ICD-10-CM | POA: Diagnosis not present

## 2014-03-15 DIAGNOSIS — R262 Difficulty in walking, not elsewhere classified: Secondary | ICD-10-CM | POA: Diagnosis not present

## 2014-03-16 DIAGNOSIS — Z9181 History of falling: Secondary | ICD-10-CM | POA: Diagnosis not present

## 2014-03-16 DIAGNOSIS — M25519 Pain in unspecified shoulder: Secondary | ICD-10-CM | POA: Diagnosis not present

## 2014-03-16 DIAGNOSIS — M6281 Muscle weakness (generalized): Secondary | ICD-10-CM | POA: Diagnosis not present

## 2014-03-16 DIAGNOSIS — R262 Difficulty in walking, not elsewhere classified: Secondary | ICD-10-CM | POA: Diagnosis not present

## 2014-03-17 DIAGNOSIS — Z9181 History of falling: Secondary | ICD-10-CM | POA: Diagnosis not present

## 2014-03-17 DIAGNOSIS — M25519 Pain in unspecified shoulder: Secondary | ICD-10-CM | POA: Diagnosis not present

## 2014-03-17 DIAGNOSIS — R262 Difficulty in walking, not elsewhere classified: Secondary | ICD-10-CM | POA: Diagnosis not present

## 2014-03-17 DIAGNOSIS — M6281 Muscle weakness (generalized): Secondary | ICD-10-CM | POA: Diagnosis not present

## 2014-03-18 DIAGNOSIS — R262 Difficulty in walking, not elsewhere classified: Secondary | ICD-10-CM | POA: Diagnosis not present

## 2014-03-18 DIAGNOSIS — M25519 Pain in unspecified shoulder: Secondary | ICD-10-CM | POA: Diagnosis not present

## 2014-03-18 DIAGNOSIS — Z9181 History of falling: Secondary | ICD-10-CM | POA: Diagnosis not present

## 2014-03-18 DIAGNOSIS — M6281 Muscle weakness (generalized): Secondary | ICD-10-CM | POA: Diagnosis not present

## 2014-03-22 DIAGNOSIS — M25519 Pain in unspecified shoulder: Secondary | ICD-10-CM | POA: Diagnosis not present

## 2014-03-22 DIAGNOSIS — Z9181 History of falling: Secondary | ICD-10-CM | POA: Diagnosis not present

## 2014-03-22 DIAGNOSIS — R262 Difficulty in walking, not elsewhere classified: Secondary | ICD-10-CM | POA: Diagnosis not present

## 2014-03-22 DIAGNOSIS — M6281 Muscle weakness (generalized): Secondary | ICD-10-CM | POA: Diagnosis not present

## 2014-03-25 DIAGNOSIS — M25519 Pain in unspecified shoulder: Secondary | ICD-10-CM | POA: Diagnosis not present

## 2014-03-25 DIAGNOSIS — Z9181 History of falling: Secondary | ICD-10-CM | POA: Diagnosis not present

## 2014-03-25 DIAGNOSIS — R262 Difficulty in walking, not elsewhere classified: Secondary | ICD-10-CM | POA: Diagnosis not present

## 2014-03-25 DIAGNOSIS — M6281 Muscle weakness (generalized): Secondary | ICD-10-CM | POA: Diagnosis not present

## 2014-03-26 DIAGNOSIS — M6281 Muscle weakness (generalized): Secondary | ICD-10-CM | POA: Diagnosis not present

## 2014-03-26 DIAGNOSIS — Z9181 History of falling: Secondary | ICD-10-CM | POA: Diagnosis not present

## 2014-03-26 DIAGNOSIS — M25519 Pain in unspecified shoulder: Secondary | ICD-10-CM | POA: Diagnosis not present

## 2014-03-26 DIAGNOSIS — R262 Difficulty in walking, not elsewhere classified: Secondary | ICD-10-CM | POA: Diagnosis not present

## 2014-03-29 DIAGNOSIS — Z9181 History of falling: Secondary | ICD-10-CM | POA: Diagnosis not present

## 2014-03-29 DIAGNOSIS — M6281 Muscle weakness (generalized): Secondary | ICD-10-CM | POA: Diagnosis not present

## 2014-03-29 DIAGNOSIS — R262 Difficulty in walking, not elsewhere classified: Secondary | ICD-10-CM | POA: Diagnosis not present

## 2014-03-29 DIAGNOSIS — M25519 Pain in unspecified shoulder: Secondary | ICD-10-CM | POA: Diagnosis not present

## 2014-03-30 DIAGNOSIS — M6281 Muscle weakness (generalized): Secondary | ICD-10-CM | POA: Diagnosis not present

## 2014-03-30 DIAGNOSIS — R262 Difficulty in walking, not elsewhere classified: Secondary | ICD-10-CM | POA: Diagnosis not present

## 2014-03-30 DIAGNOSIS — Z9181 History of falling: Secondary | ICD-10-CM | POA: Diagnosis not present

## 2014-03-30 DIAGNOSIS — M25519 Pain in unspecified shoulder: Secondary | ICD-10-CM | POA: Diagnosis not present

## 2014-04-01 DIAGNOSIS — Z9181 History of falling: Secondary | ICD-10-CM | POA: Diagnosis not present

## 2014-04-01 DIAGNOSIS — M25519 Pain in unspecified shoulder: Secondary | ICD-10-CM | POA: Diagnosis not present

## 2014-04-01 DIAGNOSIS — R262 Difficulty in walking, not elsewhere classified: Secondary | ICD-10-CM | POA: Diagnosis not present

## 2014-04-01 DIAGNOSIS — M6281 Muscle weakness (generalized): Secondary | ICD-10-CM | POA: Diagnosis not present

## 2014-04-06 DIAGNOSIS — R262 Difficulty in walking, not elsewhere classified: Secondary | ICD-10-CM | POA: Diagnosis not present

## 2014-04-06 DIAGNOSIS — H353290 Exudative age-related macular degeneration, unspecified eye, stage unspecified: Secondary | ICD-10-CM | POA: Diagnosis not present

## 2014-04-06 DIAGNOSIS — Z9181 History of falling: Secondary | ICD-10-CM | POA: Diagnosis not present

## 2014-04-06 DIAGNOSIS — M6281 Muscle weakness (generalized): Secondary | ICD-10-CM | POA: Diagnosis not present

## 2014-04-06 DIAGNOSIS — M25519 Pain in unspecified shoulder: Secondary | ICD-10-CM | POA: Diagnosis not present

## 2014-04-08 DIAGNOSIS — Z9181 History of falling: Secondary | ICD-10-CM | POA: Diagnosis not present

## 2014-04-08 DIAGNOSIS — M6281 Muscle weakness (generalized): Secondary | ICD-10-CM | POA: Diagnosis not present

## 2014-04-08 DIAGNOSIS — M25519 Pain in unspecified shoulder: Secondary | ICD-10-CM | POA: Diagnosis not present

## 2014-04-08 DIAGNOSIS — R262 Difficulty in walking, not elsewhere classified: Secondary | ICD-10-CM | POA: Diagnosis not present

## 2014-04-09 DIAGNOSIS — Z9181 History of falling: Secondary | ICD-10-CM | POA: Diagnosis not present

## 2014-04-09 DIAGNOSIS — R262 Difficulty in walking, not elsewhere classified: Secondary | ICD-10-CM | POA: Diagnosis not present

## 2014-04-09 DIAGNOSIS — M6281 Muscle weakness (generalized): Secondary | ICD-10-CM | POA: Diagnosis not present

## 2014-04-09 DIAGNOSIS — M25519 Pain in unspecified shoulder: Secondary | ICD-10-CM | POA: Diagnosis not present

## 2014-04-12 DIAGNOSIS — M25519 Pain in unspecified shoulder: Secondary | ICD-10-CM | POA: Diagnosis not present

## 2014-04-12 DIAGNOSIS — R262 Difficulty in walking, not elsewhere classified: Secondary | ICD-10-CM | POA: Diagnosis not present

## 2014-04-12 DIAGNOSIS — M6281 Muscle weakness (generalized): Secondary | ICD-10-CM | POA: Diagnosis not present

## 2014-04-12 DIAGNOSIS — Z9181 History of falling: Secondary | ICD-10-CM | POA: Diagnosis not present

## 2014-04-14 DIAGNOSIS — M6281 Muscle weakness (generalized): Secondary | ICD-10-CM | POA: Diagnosis not present

## 2014-04-14 DIAGNOSIS — Z9181 History of falling: Secondary | ICD-10-CM | POA: Diagnosis not present

## 2014-04-14 DIAGNOSIS — M25519 Pain in unspecified shoulder: Secondary | ICD-10-CM | POA: Diagnosis not present

## 2014-04-14 DIAGNOSIS — R262 Difficulty in walking, not elsewhere classified: Secondary | ICD-10-CM | POA: Diagnosis not present

## 2014-04-16 DIAGNOSIS — Z9181 History of falling: Secondary | ICD-10-CM | POA: Diagnosis not present

## 2014-04-16 DIAGNOSIS — M25519 Pain in unspecified shoulder: Secondary | ICD-10-CM | POA: Diagnosis not present

## 2014-04-16 DIAGNOSIS — R262 Difficulty in walking, not elsewhere classified: Secondary | ICD-10-CM | POA: Diagnosis not present

## 2014-04-16 DIAGNOSIS — M6281 Muscle weakness (generalized): Secondary | ICD-10-CM | POA: Diagnosis not present

## 2014-04-19 DIAGNOSIS — M25519 Pain in unspecified shoulder: Secondary | ICD-10-CM | POA: Diagnosis not present

## 2014-04-19 DIAGNOSIS — R262 Difficulty in walking, not elsewhere classified: Secondary | ICD-10-CM | POA: Diagnosis not present

## 2014-04-19 DIAGNOSIS — M6281 Muscle weakness (generalized): Secondary | ICD-10-CM | POA: Diagnosis not present

## 2014-04-19 DIAGNOSIS — Z9181 History of falling: Secondary | ICD-10-CM | POA: Diagnosis not present

## 2014-04-20 DIAGNOSIS — R262 Difficulty in walking, not elsewhere classified: Secondary | ICD-10-CM | POA: Diagnosis not present

## 2014-04-20 DIAGNOSIS — M25519 Pain in unspecified shoulder: Secondary | ICD-10-CM | POA: Diagnosis not present

## 2014-04-20 DIAGNOSIS — M6281 Muscle weakness (generalized): Secondary | ICD-10-CM | POA: Diagnosis not present

## 2014-04-20 DIAGNOSIS — Z9181 History of falling: Secondary | ICD-10-CM | POA: Diagnosis not present

## 2014-04-21 DIAGNOSIS — M6281 Muscle weakness (generalized): Secondary | ICD-10-CM | POA: Diagnosis not present

## 2014-04-21 DIAGNOSIS — Z9181 History of falling: Secondary | ICD-10-CM | POA: Diagnosis not present

## 2014-04-21 DIAGNOSIS — M25519 Pain in unspecified shoulder: Secondary | ICD-10-CM | POA: Diagnosis not present

## 2014-04-21 DIAGNOSIS — R262 Difficulty in walking, not elsewhere classified: Secondary | ICD-10-CM | POA: Diagnosis not present

## 2014-04-23 DIAGNOSIS — Z9181 History of falling: Secondary | ICD-10-CM | POA: Diagnosis not present

## 2014-04-23 DIAGNOSIS — R262 Difficulty in walking, not elsewhere classified: Secondary | ICD-10-CM | POA: Diagnosis not present

## 2014-04-23 DIAGNOSIS — M6281 Muscle weakness (generalized): Secondary | ICD-10-CM | POA: Diagnosis not present

## 2014-04-23 DIAGNOSIS — M25519 Pain in unspecified shoulder: Secondary | ICD-10-CM | POA: Diagnosis not present

## 2014-04-26 DIAGNOSIS — R262 Difficulty in walking, not elsewhere classified: Secondary | ICD-10-CM | POA: Diagnosis not present

## 2014-04-26 DIAGNOSIS — M6281 Muscle weakness (generalized): Secondary | ICD-10-CM | POA: Diagnosis not present

## 2014-04-26 DIAGNOSIS — Z9181 History of falling: Secondary | ICD-10-CM | POA: Diagnosis not present

## 2014-04-26 DIAGNOSIS — M25519 Pain in unspecified shoulder: Secondary | ICD-10-CM | POA: Diagnosis not present

## 2014-04-27 DIAGNOSIS — R262 Difficulty in walking, not elsewhere classified: Secondary | ICD-10-CM | POA: Diagnosis not present

## 2014-04-27 DIAGNOSIS — M25519 Pain in unspecified shoulder: Secondary | ICD-10-CM | POA: Diagnosis not present

## 2014-04-27 DIAGNOSIS — Z9181 History of falling: Secondary | ICD-10-CM | POA: Diagnosis not present

## 2014-04-27 DIAGNOSIS — M6281 Muscle weakness (generalized): Secondary | ICD-10-CM | POA: Diagnosis not present

## 2014-04-28 DIAGNOSIS — M25519 Pain in unspecified shoulder: Secondary | ICD-10-CM | POA: Diagnosis not present

## 2014-04-28 DIAGNOSIS — M6281 Muscle weakness (generalized): Secondary | ICD-10-CM | POA: Diagnosis not present

## 2014-04-28 DIAGNOSIS — Z9181 History of falling: Secondary | ICD-10-CM | POA: Diagnosis not present

## 2014-04-28 DIAGNOSIS — R262 Difficulty in walking, not elsewhere classified: Secondary | ICD-10-CM | POA: Diagnosis not present

## 2014-04-30 DIAGNOSIS — M25519 Pain in unspecified shoulder: Secondary | ICD-10-CM | POA: Diagnosis not present

## 2014-04-30 DIAGNOSIS — Z9181 History of falling: Secondary | ICD-10-CM | POA: Diagnosis not present

## 2014-04-30 DIAGNOSIS — R262 Difficulty in walking, not elsewhere classified: Secondary | ICD-10-CM | POA: Diagnosis not present

## 2014-04-30 DIAGNOSIS — M6281 Muscle weakness (generalized): Secondary | ICD-10-CM | POA: Diagnosis not present

## 2014-05-03 DIAGNOSIS — M6281 Muscle weakness (generalized): Secondary | ICD-10-CM | POA: Diagnosis not present

## 2014-05-03 DIAGNOSIS — R262 Difficulty in walking, not elsewhere classified: Secondary | ICD-10-CM | POA: Diagnosis not present

## 2014-05-03 DIAGNOSIS — Z9181 History of falling: Secondary | ICD-10-CM | POA: Diagnosis not present

## 2014-05-03 DIAGNOSIS — M25519 Pain in unspecified shoulder: Secondary | ICD-10-CM | POA: Diagnosis not present

## 2014-05-04 DIAGNOSIS — M6281 Muscle weakness (generalized): Secondary | ICD-10-CM | POA: Diagnosis not present

## 2014-05-04 DIAGNOSIS — Z9181 History of falling: Secondary | ICD-10-CM | POA: Diagnosis not present

## 2014-05-04 DIAGNOSIS — R262 Difficulty in walking, not elsewhere classified: Secondary | ICD-10-CM | POA: Diagnosis not present

## 2014-05-04 DIAGNOSIS — M25519 Pain in unspecified shoulder: Secondary | ICD-10-CM | POA: Diagnosis not present

## 2014-05-05 DIAGNOSIS — R262 Difficulty in walking, not elsewhere classified: Secondary | ICD-10-CM | POA: Diagnosis not present

## 2014-05-05 DIAGNOSIS — Z9181 History of falling: Secondary | ICD-10-CM | POA: Diagnosis not present

## 2014-05-05 DIAGNOSIS — M6281 Muscle weakness (generalized): Secondary | ICD-10-CM | POA: Diagnosis not present

## 2014-05-05 DIAGNOSIS — M25519 Pain in unspecified shoulder: Secondary | ICD-10-CM | POA: Diagnosis not present

## 2014-05-06 DIAGNOSIS — M25519 Pain in unspecified shoulder: Secondary | ICD-10-CM | POA: Diagnosis not present

## 2014-05-06 DIAGNOSIS — Z9181 History of falling: Secondary | ICD-10-CM | POA: Diagnosis not present

## 2014-05-06 DIAGNOSIS — R262 Difficulty in walking, not elsewhere classified: Secondary | ICD-10-CM | POA: Diagnosis not present

## 2014-05-06 DIAGNOSIS — M6281 Muscle weakness (generalized): Secondary | ICD-10-CM | POA: Diagnosis not present

## 2014-05-07 DIAGNOSIS — Z9181 History of falling: Secondary | ICD-10-CM | POA: Diagnosis not present

## 2014-05-07 DIAGNOSIS — M6281 Muscle weakness (generalized): Secondary | ICD-10-CM | POA: Diagnosis not present

## 2014-05-07 DIAGNOSIS — M25519 Pain in unspecified shoulder: Secondary | ICD-10-CM | POA: Diagnosis not present

## 2014-05-07 DIAGNOSIS — R262 Difficulty in walking, not elsewhere classified: Secondary | ICD-10-CM | POA: Diagnosis not present

## 2014-05-10 DIAGNOSIS — Z9181 History of falling: Secondary | ICD-10-CM | POA: Diagnosis not present

## 2014-05-10 DIAGNOSIS — M6281 Muscle weakness (generalized): Secondary | ICD-10-CM | POA: Diagnosis not present

## 2014-05-10 DIAGNOSIS — R262 Difficulty in walking, not elsewhere classified: Secondary | ICD-10-CM | POA: Diagnosis not present

## 2014-05-10 DIAGNOSIS — M25519 Pain in unspecified shoulder: Secondary | ICD-10-CM | POA: Diagnosis not present

## 2014-05-11 DIAGNOSIS — M25519 Pain in unspecified shoulder: Secondary | ICD-10-CM | POA: Diagnosis not present

## 2014-05-11 DIAGNOSIS — Z9181 History of falling: Secondary | ICD-10-CM | POA: Diagnosis not present

## 2014-05-11 DIAGNOSIS — R262 Difficulty in walking, not elsewhere classified: Secondary | ICD-10-CM | POA: Diagnosis not present

## 2014-05-11 DIAGNOSIS — M6281 Muscle weakness (generalized): Secondary | ICD-10-CM | POA: Diagnosis not present

## 2014-05-13 DIAGNOSIS — R262 Difficulty in walking, not elsewhere classified: Secondary | ICD-10-CM | POA: Diagnosis not present

## 2014-05-13 DIAGNOSIS — Z9181 History of falling: Secondary | ICD-10-CM | POA: Diagnosis not present

## 2014-05-13 DIAGNOSIS — M25519 Pain in unspecified shoulder: Secondary | ICD-10-CM | POA: Diagnosis not present

## 2014-05-13 DIAGNOSIS — M6281 Muscle weakness (generalized): Secondary | ICD-10-CM | POA: Diagnosis not present

## 2014-05-17 DIAGNOSIS — M25519 Pain in unspecified shoulder: Secondary | ICD-10-CM | POA: Diagnosis not present

## 2014-05-17 DIAGNOSIS — M6281 Muscle weakness (generalized): Secondary | ICD-10-CM | POA: Diagnosis not present

## 2014-05-17 DIAGNOSIS — R262 Difficulty in walking, not elsewhere classified: Secondary | ICD-10-CM | POA: Diagnosis not present

## 2014-05-17 DIAGNOSIS — Z9181 History of falling: Secondary | ICD-10-CM | POA: Diagnosis not present

## 2014-05-18 DIAGNOSIS — M6281 Muscle weakness (generalized): Secondary | ICD-10-CM | POA: Diagnosis not present

## 2014-05-18 DIAGNOSIS — R262 Difficulty in walking, not elsewhere classified: Secondary | ICD-10-CM | POA: Diagnosis not present

## 2014-05-18 DIAGNOSIS — Z9181 History of falling: Secondary | ICD-10-CM | POA: Diagnosis not present

## 2014-05-18 DIAGNOSIS — M25519 Pain in unspecified shoulder: Secondary | ICD-10-CM | POA: Diagnosis not present

## 2014-05-20 DIAGNOSIS — M6281 Muscle weakness (generalized): Secondary | ICD-10-CM | POA: Diagnosis not present

## 2014-05-20 DIAGNOSIS — R262 Difficulty in walking, not elsewhere classified: Secondary | ICD-10-CM | POA: Diagnosis not present

## 2014-05-20 DIAGNOSIS — M25519 Pain in unspecified shoulder: Secondary | ICD-10-CM | POA: Diagnosis not present

## 2014-05-20 DIAGNOSIS — Z9181 History of falling: Secondary | ICD-10-CM | POA: Diagnosis not present

## 2014-05-25 DIAGNOSIS — R262 Difficulty in walking, not elsewhere classified: Secondary | ICD-10-CM | POA: Diagnosis not present

## 2014-05-25 DIAGNOSIS — M6281 Muscle weakness (generalized): Secondary | ICD-10-CM | POA: Diagnosis not present

## 2014-05-25 DIAGNOSIS — Z9181 History of falling: Secondary | ICD-10-CM | POA: Diagnosis not present

## 2014-05-25 DIAGNOSIS — M25519 Pain in unspecified shoulder: Secondary | ICD-10-CM | POA: Diagnosis not present

## 2014-05-27 DIAGNOSIS — R262 Difficulty in walking, not elsewhere classified: Secondary | ICD-10-CM | POA: Diagnosis not present

## 2014-05-27 DIAGNOSIS — M25519 Pain in unspecified shoulder: Secondary | ICD-10-CM | POA: Diagnosis not present

## 2014-05-27 DIAGNOSIS — M6281 Muscle weakness (generalized): Secondary | ICD-10-CM | POA: Diagnosis not present

## 2014-05-27 DIAGNOSIS — Z9181 History of falling: Secondary | ICD-10-CM | POA: Diagnosis not present

## 2014-05-28 DIAGNOSIS — M6281 Muscle weakness (generalized): Secondary | ICD-10-CM | POA: Diagnosis not present

## 2014-05-28 DIAGNOSIS — M25519 Pain in unspecified shoulder: Secondary | ICD-10-CM | POA: Diagnosis not present

## 2014-05-28 DIAGNOSIS — R262 Difficulty in walking, not elsewhere classified: Secondary | ICD-10-CM | POA: Diagnosis not present

## 2014-05-28 DIAGNOSIS — Z9181 History of falling: Secondary | ICD-10-CM | POA: Diagnosis not present

## 2014-06-01 DIAGNOSIS — Z9181 History of falling: Secondary | ICD-10-CM | POA: Diagnosis not present

## 2014-06-01 DIAGNOSIS — M25519 Pain in unspecified shoulder: Secondary | ICD-10-CM | POA: Diagnosis not present

## 2014-06-01 DIAGNOSIS — M6281 Muscle weakness (generalized): Secondary | ICD-10-CM | POA: Diagnosis not present

## 2014-06-01 DIAGNOSIS — R262 Difficulty in walking, not elsewhere classified: Secondary | ICD-10-CM | POA: Diagnosis not present

## 2014-06-03 DIAGNOSIS — M6281 Muscle weakness (generalized): Secondary | ICD-10-CM | POA: Diagnosis not present

## 2014-06-03 DIAGNOSIS — M25519 Pain in unspecified shoulder: Secondary | ICD-10-CM | POA: Diagnosis not present

## 2014-06-03 DIAGNOSIS — Z9181 History of falling: Secondary | ICD-10-CM | POA: Diagnosis not present

## 2014-06-03 DIAGNOSIS — R262 Difficulty in walking, not elsewhere classified: Secondary | ICD-10-CM | POA: Diagnosis not present

## 2014-06-04 DIAGNOSIS — M25519 Pain in unspecified shoulder: Secondary | ICD-10-CM | POA: Diagnosis not present

## 2014-06-04 DIAGNOSIS — M6281 Muscle weakness (generalized): Secondary | ICD-10-CM | POA: Diagnosis not present

## 2014-06-04 DIAGNOSIS — Z9181 History of falling: Secondary | ICD-10-CM | POA: Diagnosis not present

## 2014-06-04 DIAGNOSIS — R262 Difficulty in walking, not elsewhere classified: Secondary | ICD-10-CM | POA: Diagnosis not present

## 2014-06-07 DIAGNOSIS — S99919A Unspecified injury of unspecified ankle, initial encounter: Secondary | ICD-10-CM | POA: Diagnosis not present

## 2014-06-07 DIAGNOSIS — S8990XA Unspecified injury of unspecified lower leg, initial encounter: Secondary | ICD-10-CM | POA: Diagnosis not present

## 2014-06-07 DIAGNOSIS — F411 Generalized anxiety disorder: Secondary | ICD-10-CM | POA: Diagnosis not present

## 2014-06-07 DIAGNOSIS — M79609 Pain in unspecified limb: Secondary | ICD-10-CM | POA: Diagnosis not present

## 2014-06-23 ENCOUNTER — Encounter: Payer: Self-pay | Admitting: *Deleted

## 2014-06-27 DIAGNOSIS — N390 Urinary tract infection, site not specified: Secondary | ICD-10-CM | POA: Diagnosis not present

## 2014-07-09 ENCOUNTER — Other Ambulatory Visit: Payer: Self-pay | Admitting: *Deleted

## 2014-07-09 MED ORDER — PINDOLOL 5 MG PO TABS: 5.0000 mg | ORAL_TABLET | Freq: Every day | ORAL | Status: DC

## 2014-07-12 ENCOUNTER — Other Ambulatory Visit: Payer: Self-pay | Admitting: *Deleted

## 2014-07-12 MED ORDER — PINDOLOL 5 MG PO TABS: 5.0000 mg | ORAL_TABLET | Freq: Every day | ORAL | Status: DC

## 2014-07-16 ENCOUNTER — Other Ambulatory Visit: Payer: Self-pay

## 2014-07-16 DIAGNOSIS — H353290 Exudative age-related macular degeneration, unspecified eye, stage unspecified: Secondary | ICD-10-CM | POA: Diagnosis not present

## 2014-07-16 MED ORDER — PINDOLOL 5 MG PO TABS
5.0000 mg | ORAL_TABLET | Freq: Two times a day (BID) | ORAL | Status: DC
Start: 2014-07-16 — End: 2014-07-21

## 2014-07-16 MED ORDER — PINDOLOL 5 MG PO TABS: 5.0000 mg | ORAL_TABLET | Freq: Every day | ORAL | Status: DC

## 2014-07-21 ENCOUNTER — Other Ambulatory Visit: Payer: Self-pay | Admitting: *Deleted

## 2014-07-21 MED ORDER — FUROSEMIDE 40 MG PO TABS: 40.0000 mg | ORAL_TABLET | Freq: Every day | ORAL | Status: DC

## 2014-07-21 MED ORDER — PINDOLOL 5 MG PO TABS: 5.0000 mg | ORAL_TABLET | Freq: Every day | ORAL | Status: DC

## 2014-07-28 ENCOUNTER — Ambulatory Visit (INDEPENDENT_AMBULATORY_CARE_PROVIDER_SITE_OTHER): Payer: Medicare Other | Admitting: Cardiology

## 2014-07-28 ENCOUNTER — Encounter: Payer: Self-pay | Admitting: Cardiology

## 2014-07-28 VITALS — BP 132/76 | HR 67 | Ht 59.0 in | Wt 173.0 lb

## 2014-07-28 DIAGNOSIS — I359 Nonrheumatic aortic valve disorder, unspecified: Secondary | ICD-10-CM

## 2014-07-28 DIAGNOSIS — R0602 Shortness of breath: Secondary | ICD-10-CM | POA: Diagnosis not present

## 2014-07-28 DIAGNOSIS — I10 Essential (primary) hypertension: Secondary | ICD-10-CM

## 2014-07-28 DIAGNOSIS — I350 Nonrheumatic aortic (valve) stenosis: Secondary | ICD-10-CM

## 2014-07-28 DIAGNOSIS — I5032 Chronic diastolic (congestive) heart failure: Secondary | ICD-10-CM

## 2014-07-28 DIAGNOSIS — E669 Obesity, unspecified: Secondary | ICD-10-CM

## 2014-07-28 LAB — BRAIN NATRIURETIC PEPTIDE: PRO B NATRI PEPTIDE: 508 pg/mL — AB (ref 0.0–100.0)

## 2014-07-28 NOTE — Progress Notes (Signed)
[place], [place], [place]  [address] Phone: [phone] Fax:  [phone]  Date:  07/28/2014   ID:  Kathy Hoover, DOB [birth year], MRN [id number]  PCP:  Gavin Pound, MD  Cardiologist:  Fransico Him, MD     History of Present Illness: Kathy Hoover is a 80 y.o. female with a history of chronic diastolic CHF, HTN and chronic LE edema who presents today for followup.  She denies any chest pain, dizziness, LE edema, palpitations or syncope. She has chronic SOB that is stable for the most part but does increase with ambulation.    Wt Readings from Last 3 Encounters:  07/28/14 173 lb (78.472 kg)  01/29/14 171 lb (77.565 kg)  01/18/14 173 lb 8 oz (78.7 kg)     Past Medical History  Diagnosis Date  . Lower extremity edema     CHRONIC  . Hypothyroidism   . Anxiety and depression   . Hypertension   . Moderate aortic stenosis 05/2012    by ECHO  . Cervical disc disease     W radiculopathy S/P epidural injections  . Vitamin D deficiency   . Macular degeneration     Dr Hillis Range Follows for pt.  . Anxiety   . Depression   . Exertional shortness of breath     "worse recently" (01/13/2014)  . Pneumonia     "once before today" (01/13/2014)  . Bacterial pneumonia 01/13/2014  . Chronic bronchitis     "@ least once a year;  in the winter" (01/13/2014)  . Breast cancer   . Arthritis     "terribly in my hands" (01/13/2014)  . Chronic diastolic CHF (congestive heart failure)     Preserved EF    Current Outpatient Prescriptions  Medication Sig Dispense Refill  . acetaminophen (TYLENOL) 325 MG tablet Take 2 tablets (650 mg total) by mouth every 6 (six) hours as needed (pain or Fever >/= 101).      Marland Kitchen albuterol (PROVENTIL HFA;VENTOLIN HFA) 108 (90 BASE) MCG/ACT inhaler Inhale 1-2 puffs into the lungs every 6 (six) hours as needed for wheezing or shortness of breath.  1 Inhaler  0  . Cholecalciferol (VITAMIN D3) 2000 UNITS CHEW Chew 2,000 mg by mouth daily.      Marland Kitchen escitalopram  (LEXAPRO) 10 MG tablet Take 5 mg by mouth daily.      . furosemide (LASIX) 40 MG tablet Take 1 tablet (40 mg total) by mouth daily.  30 tablet  0  . levothyroxine (SYNTHROID, LEVOTHROID) 88 MCG tablet Take 88 mcg by mouth daily.      Marland Kitchen MAGNESIUM PO Take 1 tablet by mouth daily as needed (restless leg).      . Multiple Vitamin (MULTIVITAMIN WITH MINERALS) TABS Take 1 tablet by mouth daily.      . Multiple Vitamins-Minerals (PRESERVISION AREDS PO) Take 1 capsule by mouth daily.      . pindolol (VISKEN) 5 MG tablet Take 1 tablet (5 mg total) by mouth daily.  30 tablet  0  . Polyethyl Glycol-Propyl Glycol (SYSTANE OP) Apply 1-2 drops to eye 4 (four) times daily as needed (dry eyes).      . potassium chloride (K-DUR) 10 MEQ tablet Take 10 mEq by mouth daily.      . vitamin C (ASCORBIC ACID) 500 MG tablet Take 500 mg by mouth daily.      Marland Kitchen levofloxacin (LEVAQUIN) 250 MG tablet Take 1 tablet (250 mg total) by mouth daily.  2  tablet  0   No current facility-administered medications for this visit.    Allergies:    Allergies  Allergen Reactions  . Codeine     Lip Swelling  . Penicillins     Lip swelling    Social History:  The patient  reports that she has never smoked. She has never used smokeless tobacco. She reports that she does not drink alcohol or use illicit drugs.   Family History:  The patient's family history includes CVA in her brother and father; Multiple sclerosis in her brother; Pneumonia in her father.   ROS:  Please see the history of present illness.      All other systems reviewed and negative.   PHYSICAL EXAM: VS:  BP 132/76  Pulse 67  Ht 4\' 11"  (1.499 m)  Wt 173 lb (78.472 kg)  BMI 34.92 kg/m2 Well nourished, well developed, in no acute distress HEENT: normal Neck: no JVD Cardiac:  normal S1, S2; RRR; 2/6 SM at RUSB to LLSB Lungs:  clear to auscultation bilaterally, no wheezing, rhonchi or rales Abd: soft, nontender, no hepatomegaly Ext: no edema Skin: warm and  dry Neuro:  CNs 2-12 intact, no focal abnormalities noted   ASSESSMENT AND PLAN:  1. Chronic diastolic CHF - appears euvolemic - continue Lasix/Pindolol  2. HTN - well controlled - continue Pindolol  3. Chronic LE edema - resolved - continue Lasix and TED hose stockings as needed        4.  Chronic SOB which I think is multifactorial from sedentary lifestyle, obesity and intermittent noncompliance with Lasix.  I encouraged her to try to walk some.  I will check a BMET and BNP today.  I also encouraged her to try to not miss her lasix doses       5.  Moderate AS  Followup with me in 6 months      Signed, Fransico Him, MD 07/28/2014 11:16 AM

## 2014-07-28 NOTE — Patient Instructions (Signed)
Your physician recommends that you return for lab work today for BNP and BMet.  Your physician recommends that you continue on your current medications as directed. Please refer to the Current Medication list given to you today.  Your physician wants you to follow-up in: 6 months with Dr. Radford Pax. You will receive a reminder letter in the mail two months in advance. If you don't receive a letter, please call our office to schedule the follow-up appointment.

## 2014-07-29 ENCOUNTER — Other Ambulatory Visit: Payer: Self-pay | Admitting: General Surgery

## 2014-07-29 DIAGNOSIS — R0602 Shortness of breath: Secondary | ICD-10-CM

## 2014-07-29 LAB — BASIC METABOLIC PANEL
BUN: 26 mg/dL — ABNORMAL HIGH (ref 6–23)
CO2: 28 mEq/L (ref 19–32)
Calcium: 9.5 mg/dL (ref 8.4–10.5)
Chloride: 103 mEq/L (ref 96–112)
Creatinine, Ser: 1.1 mg/dL (ref 0.4–1.2)
GFR: 48.88 mL/min — AB (ref >60.00)
Glucose, Bld: 100 mg/dL — ABNORMAL HIGH (ref 70–99)
POTASSIUM: 4.5 meq/L (ref 3.5–5.1)
SODIUM: 141 meq/L (ref 135–145)

## 2014-08-13 ENCOUNTER — Other Ambulatory Visit (INDEPENDENT_AMBULATORY_CARE_PROVIDER_SITE_OTHER): Payer: Medicare Other | Admitting: *Deleted

## 2014-08-13 ENCOUNTER — Telehealth: Payer: Self-pay | Admitting: Cardiology

## 2014-08-13 DIAGNOSIS — R0602 Shortness of breath: Secondary | ICD-10-CM | POA: Diagnosis not present

## 2014-08-13 LAB — BRAIN NATRIURETIC PEPTIDE: PRO B NATRI PEPTIDE: 392 pg/mL — AB (ref 0.0–100.0)

## 2014-08-13 NOTE — Telephone Encounter (Signed)
Walk in pt Form " pt was having Chest Pain" gave to Message Nurse  10.2.15/km

## 2014-08-24 ENCOUNTER — Telehealth: Payer: Self-pay | Admitting: General Surgery

## 2014-08-24 NOTE — Telephone Encounter (Signed)
Called and Spoke to son in regard to letter. Pt had one small spell of CP and she had a 81 MG of ASA and has not had any issues. Is pt ok to take a ASA daily or just as needed?

## 2014-08-25 MED ORDER — ASPIRIN EC 81 MG PO TBEC: 81.0000 mg | DELAYED_RELEASE_TABLET | ORAL | Status: DC | PRN

## 2014-08-25 NOTE — Telephone Encounter (Signed)
Pt is aware.  

## 2014-08-25 NOTE — Telephone Encounter (Signed)
Added ASA 81 MG to pts med list as a PRN drug

## 2014-08-25 NOTE — Addendum Note (Signed)
Addended by: Lily Kocher on: 08/25/2014 08:57 AM   Modules accepted: Orders

## 2014-08-25 NOTE — Telephone Encounter (Signed)
lmtrc

## 2014-08-25 NOTE — Telephone Encounter (Signed)
Ok to take ASA as needed

## 2014-09-02 ENCOUNTER — Telehealth: Payer: Self-pay | Admitting: Cardiology

## 2014-09-02 NOTE — Telephone Encounter (Signed)
New problem   Pt was told by nurse to call back in 1 wk after taking her BP meds. Please call pt.

## 2014-09-02 NOTE — Telephone Encounter (Signed)
Left message to call office tomorrow. 

## 2014-09-02 NOTE — Telephone Encounter (Signed)
I dont have a record that we told her to do this.  Please ask her if this was her PCP that she was supposed to call

## 2014-09-02 NOTE — Telephone Encounter (Signed)
Followed up   Pt returned call, She says she will be leaving the house at 2:30pm.. Please call

## 2014-09-02 NOTE — Telephone Encounter (Signed)
Left message to call back  

## 2014-09-02 NOTE — Telephone Encounter (Signed)
Patient wanted to report her BP readings for the last week. 10/22: 150/57 10/20: 169/67 10/19: 137/66 10/17: 166/71 10/16: 162/73 10/14: 163/73 10/13: 165/69  To Dr. Radford Pax for review and recommendations.

## 2014-09-03 ENCOUNTER — Other Ambulatory Visit: Payer: Self-pay

## 2014-09-03 DIAGNOSIS — Z23 Encounter for immunization: Secondary | ICD-10-CM | POA: Diagnosis not present

## 2014-09-03 MED ORDER — FUROSEMIDE 40 MG PO TABS: 40.0000 mg | ORAL_TABLET | Freq: Every day | ORAL | Status: DC

## 2014-09-03 NOTE — Telephone Encounter (Signed)
Follow Up      Pt calling back in regards to blood pressure. Wants to speak to Dr. Theodosia Blender nurse.

## 2014-09-03 NOTE — Telephone Encounter (Signed)
Pt inquiring about BP medications. Per her prescription she got filled today, she is to take 5 mg Pindolol BID. Per our records, she is to take 5 mg Pindolol once daily. Confirmed with patient correct dose to take.  Patient also confirmed it was Andee Poles who asked her to check her BP and inform us of results, not her PCP.  Instructed patient to call our office with any other questions or concerns.

## 2014-09-12 ENCOUNTER — Other Ambulatory Visit: Payer: Self-pay

## 2014-09-12 MED ORDER — FUROSEMIDE 40 MG PO TABS: 40.0000 mg | ORAL_TABLET | Freq: Every day | ORAL | Status: DC

## 2014-10-11 DIAGNOSIS — E559 Vitamin D deficiency, unspecified: Secondary | ICD-10-CM | POA: Diagnosis not present

## 2014-10-11 DIAGNOSIS — E039 Hypothyroidism, unspecified: Secondary | ICD-10-CM | POA: Diagnosis not present

## 2014-10-11 DIAGNOSIS — I10 Essential (primary) hypertension: Secondary | ICD-10-CM | POA: Diagnosis not present

## 2014-10-11 DIAGNOSIS — F329 Major depressive disorder, single episode, unspecified: Secondary | ICD-10-CM | POA: Diagnosis not present

## 2014-10-11 DIAGNOSIS — Z0000 Encounter for general adult medical examination without abnormal findings: Secondary | ICD-10-CM | POA: Diagnosis not present

## 2014-10-11 DIAGNOSIS — Z23 Encounter for immunization: Secondary | ICD-10-CM | POA: Diagnosis not present

## 2014-11-13 DIAGNOSIS — R110 Nausea: Secondary | ICD-10-CM | POA: Diagnosis not present

## 2014-11-15 DIAGNOSIS — J069 Acute upper respiratory infection, unspecified: Secondary | ICD-10-CM | POA: Diagnosis not present

## 2014-11-17 ENCOUNTER — Encounter: Payer: Self-pay | Admitting: Cardiology

## 2014-11-19 DIAGNOSIS — H3532 Exudative age-related macular degeneration: Secondary | ICD-10-CM | POA: Diagnosis not present

## 2014-11-22 DIAGNOSIS — J069 Acute upper respiratory infection, unspecified: Secondary | ICD-10-CM | POA: Diagnosis not present

## 2014-12-22 ENCOUNTER — Encounter: Payer: Self-pay | Admitting: Podiatry

## 2014-12-22 ENCOUNTER — Ambulatory Visit (INDEPENDENT_AMBULATORY_CARE_PROVIDER_SITE_OTHER): Payer: Medicare Other | Admitting: Podiatry

## 2014-12-22 VITALS — BP 177/75 | HR 55 | Ht 59.0 in | Wt 165.0 lb

## 2014-12-22 DIAGNOSIS — L600 Ingrowing nail: Secondary | ICD-10-CM

## 2014-12-22 DIAGNOSIS — B351 Tinea unguium: Secondary | ICD-10-CM | POA: Diagnosis not present

## 2014-12-22 DIAGNOSIS — M79606 Pain in leg, unspecified: Secondary | ICD-10-CM | POA: Diagnosis not present

## 2014-12-22 NOTE — Patient Instructions (Signed)
Seen for hypertrophic nails and painful ingrown nails. All nails debrided. Return in 3 months or as needed.  

## 2014-12-22 NOTE — Progress Notes (Signed)
SUBJECTIVE: 81 y.o. year old female presents complaining of painful toes from ingrown nails.  Patient is wheeled walker and accompanied by a care taker.  Wears compression stockings for ankle edema. Having hard of hearing.   REVIEW OF SYSTEMS: A comprehensive review of systems was negative.  OBJECTIVE: DERMATOLOGIC EXAMINATION: Painful ingrown nails both great toes without open infection or drainage. Hypertrophic nails x 10. VASCULAR EXAMINATION OF LOWER LIMBS: Pedal pulses: All pedal pulses are palpable with normal pulsation.  Minimum edema noted. Temperature gradient from tibial crest to dorsum of foot is within normal bilateral. NEUROLOGIC EXAMINATION OF THE LOWER LIMBS: All epicritic and tactile sensations grossly intact. MUSCULOSKELETAL EXAMINATION: Mild digital contracture lesser digits bilateral.  ASSESSMENT: Onychomycosis x 10. Ingrown nail both great toes. Pain in lower limb.  PLAN: Reviewed clinical findings and available options. All nails debrided.

## 2015-03-07 ENCOUNTER — Ambulatory Visit
Admission: RE | Admit: 2015-03-07 | Discharge: 2015-03-07 | Disposition: A | Discharge status: Home / Self Care | Payer: Medicare Other | Source: Ambulatory Visit | Attending: Nurse Practitioner | Admitting: Nurse Practitioner

## 2015-03-07 ENCOUNTER — Telehealth: Payer: Self-pay | Admitting: Nurse Practitioner

## 2015-03-07 ENCOUNTER — Telehealth: Payer: Self-pay | Admitting: Cardiology

## 2015-03-07 ENCOUNTER — Telehealth: Payer: Self-pay | Admitting: *Deleted

## 2015-03-07 ENCOUNTER — Ambulatory Visit (INDEPENDENT_AMBULATORY_CARE_PROVIDER_SITE_OTHER): Payer: Medicare Other | Admitting: Nurse Practitioner

## 2015-03-07 VITALS — BP 162/80 | HR 59 | Ht 59.0 in | Wt 173.0 lb

## 2015-03-07 DIAGNOSIS — R0602 Shortness of breath: Secondary | ICD-10-CM | POA: Diagnosis not present

## 2015-03-07 DIAGNOSIS — I350 Nonrheumatic aortic (valve) stenosis: Secondary | ICD-10-CM

## 2015-03-07 DIAGNOSIS — I5032 Chronic diastolic (congestive) heart failure: Secondary | ICD-10-CM

## 2015-03-07 DIAGNOSIS — I10 Essential (primary) hypertension: Secondary | ICD-10-CM

## 2015-03-07 LAB — BASIC METABOLIC PANEL
BUN: 26 mg/dL — ABNORMAL HIGH (ref 6–23)
CO2: 32 mEq/L (ref 19–32)
Calcium: 9.6 mg/dL (ref 8.4–10.5)
Chloride: 101 mEq/L (ref 96–112)
Creatinine, Ser: 1.03 mg/dL (ref 0.40–1.20)
GFR: 52.67 mL/min — ABNORMAL LOW (ref >60.00)
Glucose, Bld: 99 mg/dL (ref 70–99)
Potassium: 4.1 mEq/L (ref 3.5–5.1)
Sodium: 139 mEq/L (ref 135–145)

## 2015-03-07 LAB — CBC
HCT: 36.9 % (ref 36.0–46.0)
Hemoglobin: 12.4 g/dL (ref 12.0–15.0)
MCHC: 33.7 g/dL (ref 30.0–36.0)
MCV: 91.5 fl (ref 78.0–100.0)
Platelets: 132 10*3/uL — ABNORMAL LOW (ref 150.0–400.0)
RBC: 4.03 Mil/uL (ref 3.87–5.11)
RDW: 14.8 % (ref 11.5–15.5)
WBC: 5 10*3/uL (ref 4.0–10.5)

## 2015-03-07 LAB — TSH: TSH: 1.6 u[IU]/mL (ref 0.35–4.50)

## 2015-03-07 LAB — BRAIN NATRIURETIC PEPTIDE: Pro B Natriuretic peptide (BNP): 584 pg/mL — ABNORMAL HIGH (ref 0.0–100.0)

## 2015-03-07 NOTE — Telephone Encounter (Signed)
New message       Have questions regarding presc for oxygen written today

## 2015-03-07 NOTE — Patient Instructions (Addendum)
We will be checking the following labs today - BMET, CBC, TSH & BNP   Medication Instructions:    Continue with your current medicines but I am increasing your lasix to 60 mg (1 1/2 pills) daily for the next 3 days and take an extra potassium daily for the next 3 days - then go back to regular dose.    Testing/Procedures To Be Arranged: Please go to [facility] to [facility] on the first floor for a chest Xray - you may walk in.    Follow-Up:   Will see how your tests turn out and will then decide when to come back   Other Special Instructions:   We will arrange for home oxygen at 2 liters - to use when needed with [facility]  Call the [place] office at [phone] if you have any questions, problems or concerns.

## 2015-03-07 NOTE — Telephone Encounter (Signed)
Patient's son, Ronnae Kaser, called to report that patient was having increased SOB and fatigue. He stated that he is concerned that she has fluid built up and may not be taking her Lasix daily, as ordered. SOB is even at rest and of course worsens even when walking to BR from bedroom. Dr. Harrington Challenger called the patient and spoke directly to her to assess patient.  Dr. Harrington Challenger advised patient to take an extra dose of Lasix now and an extra dose of KCl now.  Patient verbalized understanding and agreement. Patient is also to come in for OV and lab work.  (See notes below - Patient coming in to see Truitt Merle, NP today at 1:30 pm and to get labwork completed for BMET, BNP and CBC. Called patient's son/wife back to advise them of the orders from Dr. Harrington Challenger. They are in agreement and will bring the patient for OV this afternoon.

## 2015-03-07 NOTE — Telephone Encounter (Signed)
Pt c/o Shortness Of Breath: STAT if SOB developed within the last 24 hours or pt is noticeably SOB on the phone  1. Are you currently SOB (can you hear that pt is SOB on the phone)? Yes   2. How long have you been experiencing SOB? Last few weeks  3. Are you SOB when sitting or when up moving around? Moving around  4. Are you currently experiencing any other symptoms? No   Pt's son called and stated his mom is having SOB. Patient isn't with the pt. Pt's son answered all the questions. Please call pt's son back.

## 2015-03-07 NOTE — Telephone Encounter (Signed)
See other phone note

## 2015-03-07 NOTE — Telephone Encounter (Signed)
Patient st she has had SOB on exertion for 1 week. She has no C/O swelling or weight gain. She has been taking her medications as directed.  Patient does not sound SOB on the phone and st she feels "good for now, but hasn't been moving much."  Spoke with Truitt Merle, who agrees to see patient today at 1330.

## 2015-03-07 NOTE — Telephone Encounter (Signed)
Called [facility] back about patient's oxygen order. Refax order to [place] at [facility]. Omar Person after fax was sent to see if fax was received. Myriam Jacobson approved the order and had no other questions.

## 2015-03-07 NOTE — Telephone Encounter (Signed)
Transferring call to triage//sr

## 2015-03-07 NOTE — Telephone Encounter (Signed)
New Message  Pt c/o Shortness Of Breath: STAT if SOB developed within the last 24 hours or pt is noticeably SOB on the phone  1. Are you currently SOB (can you hear that pt is SOB on the phone)? yes  2. How long have you been experiencing SOB? Months- past few days has gotten worse  3. Are you SOB when sitting or when up moving around? Moving around  4. Are you currently experiencing any other symptoms? No.

## 2015-03-07 NOTE — Progress Notes (Addendum)
CARDIOLOGY OFFICE NOTE  Date:  03/07/2015    Kathy Hoover Date of Birth: [birth year] Medical Record #[id number]  PCP:  Gavin Pound, MD  Cardiologist:  Radford Pax    Chief Complaint  Patient presents with  . Shortness of Breath    Work in visit - seen for Dr. Radford Pax     History of Present Illness: Kathy Hoover is a 81 y.o. female who presents today for a work in visit. Seen for Dr. Radford Pax. She has a history of chronic diastolic CHF, HTN and chronic LE edema. She has had remote breast cancer.   She was last seen in September of 2015.  Called earlier today with worsening dyspnea. Thus added to the FLEX.  Comes in today. Here with her daughter in law - Kathy Hoover - I actually have seen Kathy Hoover's husband in the remote past (former patient of Dr. Susa Simmonds). She has not felt well for the past week. She always has some degree of dyspnea - but worse since last Sunday. No cough. No fever. Dyspneic with any activity. No chest pain. Not dizzy or lightheaded. No falls. Weight seems stable. She has chronic swelling but not any worse.    Past Medical History  Diagnosis Date  . Lower extremity edema     CHRONIC  . Hypothyroidism   . Anxiety and depression   . Hypertension   . Moderate aortic stenosis 05/2012    by ECHO  . Cervical disc disease     W radiculopathy S/P epidural injections  . Vitamin D deficiency   . Macular degeneration     Dr Hillis Range Follows for pt.  . Anxiety   . Depression   . Exertional shortness of breath     "worse recently" (01/13/2014)  . Pneumonia     "once before today" (01/13/2014)  . Bacterial pneumonia 01/13/2014  . Chronic bronchitis     "@ least once a year;  in the winter" (01/13/2014)  . Breast cancer   . Arthritis     "terribly in my hands" (01/13/2014)  . Chronic diastolic CHF (congestive heart failure)     Preserved EF    Past Surgical History  Procedure Laterality Date  . Cataract extraction w/ intraocular lens  implant, bilateral Bilateral ~ 2008  .  Tonsillectomy  1949  . Abdominal hysterectomy    . Appendectomy  1949  . Breast biopsy Right ~ 2005  . Breast lumpectomy Right ~ 2005     Medications: Current Outpatient Prescriptions  Medication Sig Dispense Refill  . acetaminophen (TYLENOL) 325 MG tablet Take 2 tablets (650 mg total) by mouth every 6 (six) hours as needed (pain or Fever >/= 101).    Marland Kitchen albuterol (PROVENTIL HFA;VENTOLIN HFA) 108 (90 BASE) MCG/ACT inhaler Inhale 1-2 puffs into the lungs every 6 (six) hours as needed for wheezing or shortness of breath. 1 Inhaler 0  . Cholecalciferol (VITAMIN D3) 2000 UNITS CHEW Chew 2,000 mg by mouth daily.    Marland Kitchen escitalopram (LEXAPRO) 10 MG tablet Take 5 mg by mouth daily.    . furosemide (LASIX) 40 MG tablet Take 1 tablet (40 mg total) by mouth daily. 30 tablet 6  . levofloxacin (LEVAQUIN) 250 MG tablet Take 1 tablet (250 mg total) by mouth daily. 2 tablet 0  . levothyroxine (SYNTHROID, LEVOTHROID) 88 MCG tablet Take 88 mcg by mouth daily.    Marland Kitchen MAGNESIUM PO Take 1 tablet by mouth daily as needed (restless leg).    . Multiple Vitamin (MULTIVITAMIN  WITH MINERALS) TABS Take 1 tablet by mouth daily.    . Multiple Vitamins-Minerals (PRESERVISION AREDS PO) Take 1 capsule by mouth daily.    . pindolol (VISKEN) 5 MG tablet Take 1 tablet (5 mg total) by mouth daily. 30 tablet 0  . Polyethyl Glycol-Propyl Glycol (SYSTANE OP) Apply 1-2 drops to eye 4 (four) times daily as needed (dry eyes).    . potassium chloride (K-DUR) 10 MEQ tablet Take 10 mEq by mouth daily.    . vitamin C (ASCORBIC ACID) 500 MG tablet Take 500 mg by mouth daily.     No current facility-administered medications for this visit.    Allergies: Allergies  Allergen Reactions  . Codeine     Lip Swelling  . Penicillins     Lip swelling    Social History: The patient  reports that she has never smoked. She has never used smokeless tobacco. She reports that she does not drink alcohol or use illicit drugs.   Family  History: The patient's family history includes CVA in her brother and father; Multiple sclerosis in her brother; Pneumonia in her father.   Review of Systems: Please see the history of present illness.   Otherwise, the review of systems is positive for shortness of breath with lying down, visual disturbance, DOE and joint swelling.  All other systems are reviewed and negative.   Physical Exam: VS:  BP 162/80 mmHg  Pulse 59  Ht 4\' 11"  (1.499 m)  Wt 173 lb (78.472 kg)  BMI 34.92 kg/m2  SpO2 94% .  BMI Body mass index is 34.92 kg/(m^2).  Wt Readings from Last 3 Encounters:  03/07/15 173 lb (78.472 kg)  12/22/14 165 lb (74.844 kg)  07/28/14 173 lb (78.472 kg)    General: Pleasant. Well developed, well nourished and in no acute distress.  HEENT: Normal. Neck: Supple, no JVD, carotid bruits, or masses noted.  Cardiac: Regular rate and rhythm. She has a harsh murmur of AS noted. +1 edema.  Respiratory:  Lungs are clear to auscultation bilaterally with normal work of breathing.  GI: Soft and nontender.  MS: No deformity or atrophy. Gait and ROM intact. Skin: Warm and dry. Color is normal.  Neuro:  Strength and sensation are intact and no gross focal deficits noted.  Psych: Alert, appropriate and with normal affect.   LABORATORY DATA:  EKG:  EKG is ordered today. This demonstrates sinus brady - rate of 59 - with PACs noted .  Lab Results  Component Value Date   WBC 5.2 01/16/2014   HGB 11.4* 01/16/2014   HCT 34.1* 01/16/2014   PLT 92* 01/16/2014   GLUCOSE 100* 07/28/2014   ALT 17 05/21/2012   AST 25 05/21/2012   NA 141 07/28/2014   K 4.5 07/28/2014   CL 103 07/28/2014   CREATININE 1.1 07/28/2014   BUN 26* 07/28/2014   CO2 28 07/28/2014    BNP (last 3 results) No results for input(s): BNP in the last 8760 hours.  ProBNP (last 3 results)  Recent Labs  07/28/14 1142 08/13/14 1006  PROBNP 508.0* 392.0*     Other Studies Reviewed Today:  Echo Study Conclusions  from 02/2014  - Left ventricle: The cavity size was normal. Wall thickness was increased in a pattern of mild LVH. Systolic function was normal. The estimated ejection fraction was in the range of 60% to 65%. - Aortic valve: AV is thickened, calcified with restricted motion. Peak and mean gradients through the valve are 45 and 28  mm Hg respectively consistent with moderate AS. Mild regurgitation. - Mitral valve: Mild regurgitation.  Assessment/Plan: 1. Dyspnea - most likely multifactorial - will check lab today. Increase Lasix to 60 mg for 3 days and increase her potassium for 3 days. She is hypoxic here on room air and with ambulation. Will need labs today to include BNP and CBC. Resting sat is 89 to 94% on room air. With walking her sat is down to 84% on room air. With walking with oxygen at 2 liters, her sat is 92%. Will start oxygen at 2 l.   2. Aortic stenosis - moderate by echo from April of 2015 - would favor conservative management. Other than her dyspnea, she has no other cardinal symptoms.   3. Chronic diastolic HF  4. Advanced age  Current medicines are reviewed with the patient today.  The patient does not have concerns regarding medicines other than what has been noted above.  The following changes have been made:  See above.  Labs/ tests ordered today include:    Orders Placed This Encounter  Procedures  . DG Chest 2 View  . Brain natriuretic peptide  . Basic metabolic panel  . CBC  . TSH  . EKG 12-Lead     Disposition:   Further disposition to follow. Would like to see what her CXR and labs look like. May need to get to primary care.   Patient is agreeable to this plan and will call if any problems develop in the interim.   Signed: Burtis Junes, RN, ANP-C 03/07/2015 2:07 PM  [facility] [address] [place], [place]  [address] Phone: [phone] Fax: [phone]

## 2015-03-07 NOTE — Telephone Encounter (Signed)
Follow up      Make sure oxygen order says continous

## 2015-03-07 NOTE — Telephone Encounter (Signed)
Labs ordered under Dr. Harrington Challenger cancelled. Truitt Merle, NP, is seeing patient today in clinic and will order them at that time.

## 2015-03-08 ENCOUNTER — Telehealth: Payer: Self-pay | Admitting: Nurse Practitioner

## 2015-03-08 NOTE — Telephone Encounter (Signed)
Follow up      Son calls back to talk to Cecille Rubin about his mom

## 2015-03-08 NOTE — Telephone Encounter (Signed)
New message       Son would like to take to the nurse who just talked to his mother

## 2015-03-08 NOTE — Telephone Encounter (Signed)
Talked with Legrand Como patient's son. Patient had requested for nurse to discuss her labs with him. Patient had also requested her labs be mailed to her. Labs will be mailed to patient. Patient's son verbalized understanding of lab results.

## 2015-03-08 NOTE — Telephone Encounter (Signed)
S/w pt's son wants me to talk with pt about oxygen.  S/w pt does not want to wear oxygen states she is old, and if pt dies pt would be okay with that. Stated we could get smaller tank pt did not want to be bothered.  Pt's son was put back on the phone and stated what if pt just wore 02 around the house. Pt does not want to be bothered and called [facility] to pick [facility] today. Cecille Rubin stated cannot make pt wear oxygen.

## 2015-03-09 ENCOUNTER — Other Ambulatory Visit: Payer: Self-pay

## 2015-03-09 MED ORDER — FUROSEMIDE 40 MG PO TABS: 40.0000 mg | ORAL_TABLET | Freq: Every day | ORAL | Status: DC

## 2015-03-14 ENCOUNTER — Telehealth: Payer: Self-pay | Admitting: Nurse Practitioner

## 2015-03-14 NOTE — Telephone Encounter (Signed)
New message       Pt want the nurse to know she still has not received the lab results and xray results in the mail.  The nurse was going to mail these items to her

## 2015-03-14 NOTE — Telephone Encounter (Signed)
Follow UP  Pt son calling about pt's lab results being mailed to her. Please call back and discuss.

## 2015-03-14 NOTE — Telephone Encounter (Signed)
Called patient's son back. Discussed patient's mailed lab results. Patient has not received labs yet, so mailed another copy for patient. Patient's son verbalized understanding. Son stated, his mother has now agreed to use O2. Patient had refused home O2 last week, but now sees the benefit. Encouraged patient's son to call with any other concerns.

## 2015-03-15 ENCOUNTER — Telehealth: Payer: Self-pay | Admitting: *Deleted

## 2015-03-15 NOTE — Telephone Encounter (Signed)
Left message on machine for pt to contact the office.   

## 2015-03-16 ENCOUNTER — Telehealth: Payer: Self-pay | Admitting: *Deleted

## 2015-03-16 NOTE — Telephone Encounter (Signed)
Left message on machine for pt to contact the office.   

## 2015-03-16 NOTE — Telephone Encounter (Signed)
Waiting for pt's daughter-in-law to call me back.  Kathy Hoover @ [phone] about brother in law.

## 2015-03-17 NOTE — Telephone Encounter (Signed)
Wanted to let Kathy Hoover know that we received her note and that Cecille Rubin and Dr. Heron Nay reviewed it. Cecille Rubin and Dr. Radford Pax wanted to let Kathy Hoover know that according to [facility] that consent has been given to share information with the son Jenny Reichmann.

## 2015-03-22 ENCOUNTER — Telehealth: Payer: Self-pay | Admitting: *Deleted

## 2015-03-22 ENCOUNTER — Ambulatory Visit (INDEPENDENT_AMBULATORY_CARE_PROVIDER_SITE_OTHER): Payer: Medicare Other | Admitting: Podiatry

## 2015-03-22 ENCOUNTER — Encounter: Payer: Self-pay | Admitting: Podiatry

## 2015-03-22 DIAGNOSIS — B351 Tinea unguium: Secondary | ICD-10-CM | POA: Diagnosis not present

## 2015-03-22 DIAGNOSIS — M79606 Pain in leg, unspecified: Secondary | ICD-10-CM

## 2015-03-22 NOTE — Progress Notes (Signed)
SUBJECTIVE: 81 y.o. year old female presents complaining of painful toes from ingrown nails and requests nails trimmed.  Patient  Ambulates with wheeled walker. Wears compression stockings for ankle edema. Having hard of hearing.   OBJECTIVE: DERMATOLOGIC EXAMINATION: Painful ingrown nails both great toes without open infection or drainage. Hypertrophic nails x 10. VASCULAR EXAMINATION OF LOWER LIMBS: Pedal pulses: All pedal pulses are palpable with normal pulsation.  Minimum edema noted. Temperature gradient from tibial crest to dorsum of foot is within normal bilateral. NEUROLOGIC EXAMINATION OF THE LOWER LIMBS: All epicritic and tactile sensations grossly intact. MUSCULOSKELETAL EXAMINATION: Mild digital contracture lesser digits bilateral.  ASSESSMENT: Onychomycosis x 10. Ingrown nail both great toes. Pain in lower limb.  Plan: All nails debrided. Pain was relieved. Return in 3 months.

## 2015-03-22 NOTE — Patient Instructions (Signed)
Seen for hypertrophic nails and ingrown nails. All nails debrided. Return in 3 months or as needed.  

## 2015-03-22 NOTE — Telephone Encounter (Signed)
Faxed to [phone] [facility] for medical necessity for oxygen.

## 2015-03-25 DIAGNOSIS — R040 Epistaxis: Secondary | ICD-10-CM | POA: Diagnosis not present

## 2015-03-25 DIAGNOSIS — Z9981 Dependence on supplemental oxygen: Secondary | ICD-10-CM | POA: Diagnosis not present

## 2015-04-08 DIAGNOSIS — F329 Major depressive disorder, single episode, unspecified: Secondary | ICD-10-CM | POA: Diagnosis not present

## 2015-04-08 DIAGNOSIS — Z79899 Other long term (current) drug therapy: Secondary | ICD-10-CM | POA: Diagnosis not present

## 2015-04-08 DIAGNOSIS — E559 Vitamin D deficiency, unspecified: Secondary | ICD-10-CM | POA: Diagnosis not present

## 2015-04-08 DIAGNOSIS — I5030 Unspecified diastolic (congestive) heart failure: Secondary | ICD-10-CM | POA: Diagnosis not present

## 2015-04-08 DIAGNOSIS — E039 Hypothyroidism, unspecified: Secondary | ICD-10-CM | POA: Diagnosis not present

## 2015-04-08 DIAGNOSIS — I10 Essential (primary) hypertension: Secondary | ICD-10-CM | POA: Diagnosis not present

## 2015-04-10 DIAGNOSIS — S8982XA Other specified injuries of left lower leg, initial encounter: Secondary | ICD-10-CM | POA: Diagnosis not present

## 2015-04-20 DIAGNOSIS — M170 Bilateral primary osteoarthritis of knee: Secondary | ICD-10-CM | POA: Diagnosis not present

## 2015-04-20 DIAGNOSIS — M1611 Unilateral primary osteoarthritis, right hip: Secondary | ICD-10-CM | POA: Diagnosis not present

## 2015-04-22 DIAGNOSIS — S93402D Sprain of unspecified ligament of left ankle, subsequent encounter: Secondary | ICD-10-CM | POA: Diagnosis not present

## 2015-04-22 DIAGNOSIS — L03116 Cellulitis of left lower limb: Secondary | ICD-10-CM | POA: Diagnosis not present

## 2015-04-26 DIAGNOSIS — H26492 Other secondary cataract, left eye: Secondary | ICD-10-CM | POA: Diagnosis not present

## 2015-04-27 ENCOUNTER — Telehealth: Payer: Self-pay | Admitting: Cardiology

## 2015-04-27 NOTE — Telephone Encounter (Signed)
Left message to re-send fax. That as soon as faxes are received, they are signed and sent back.  Instructed Lattie Haw to send to [phone].

## 2015-04-27 NOTE — Telephone Encounter (Signed)
New Message  Lattie Haw from [facility] following up on form- o2 equip document [id number]- faxed via [employer]scribe on 5/30. Please call back and discuss.

## 2015-04-28 DIAGNOSIS — S93409A Sprain of unspecified ligament of unspecified ankle, initial encounter: Secondary | ICD-10-CM | POA: Diagnosis not present

## 2015-04-28 DIAGNOSIS — R079 Chest pain, unspecified: Secondary | ICD-10-CM | POA: Diagnosis not present

## 2015-04-28 DIAGNOSIS — R197 Diarrhea, unspecified: Secondary | ICD-10-CM | POA: Diagnosis not present

## 2015-04-28 DIAGNOSIS — L03116 Cellulitis of left lower limb: Secondary | ICD-10-CM | POA: Diagnosis not present

## 2015-04-28 DIAGNOSIS — R0902 Hypoxemia: Secondary | ICD-10-CM | POA: Diagnosis not present

## 2015-04-28 NOTE — Telephone Encounter (Signed)
Paperwork received, signed, and faxed.

## 2015-05-18 DIAGNOSIS — M1611 Unilateral primary osteoarthritis, right hip: Secondary | ICD-10-CM | POA: Diagnosis not present

## 2015-05-18 DIAGNOSIS — M170 Bilateral primary osteoarthritis of knee: Secondary | ICD-10-CM | POA: Diagnosis not present

## 2015-05-24 DIAGNOSIS — H3531 Nonexudative age-related macular degeneration: Secondary | ICD-10-CM | POA: Diagnosis not present

## 2015-05-24 DIAGNOSIS — H26492 Other secondary cataract, left eye: Secondary | ICD-10-CM | POA: Diagnosis not present

## 2015-05-27 DIAGNOSIS — H3532 Exudative age-related macular degeneration: Secondary | ICD-10-CM | POA: Diagnosis not present

## 2015-06-05 ENCOUNTER — Encounter (HOSPITAL_BASED_OUTPATIENT_CLINIC_OR_DEPARTMENT_OTHER): Payer: Self-pay | Admitting: *Deleted

## 2015-06-05 ENCOUNTER — Inpatient Hospital Stay (HOSPITAL_BASED_OUTPATIENT_CLINIC_OR_DEPARTMENT_OTHER)
Admission: EM | Admit: 2015-06-05 | Discharge: 2015-06-05 | DRG: 281 | Disposition: A | Discharge status: Home / Self Care | Payer: Medicare Other | Attending: Cardiovascular Disease | Admitting: Cardiovascular Disease

## 2015-06-05 ENCOUNTER — Emergency Department (HOSPITAL_BASED_OUTPATIENT_CLINIC_OR_DEPARTMENT_OTHER): Payer: Medicare Other

## 2015-06-05 DIAGNOSIS — Z885 Allergy status to narcotic agent status: Secondary | ICD-10-CM

## 2015-06-05 DIAGNOSIS — R918 Other nonspecific abnormal finding of lung field: Secondary | ICD-10-CM | POA: Diagnosis not present

## 2015-06-05 DIAGNOSIS — I350 Nonrheumatic aortic (valve) stenosis: Secondary | ICD-10-CM | POA: Diagnosis present

## 2015-06-05 DIAGNOSIS — I214 Non-ST elevation (NSTEMI) myocardial infarction: Principal | ICD-10-CM | POA: Diagnosis present

## 2015-06-05 DIAGNOSIS — I5032 Chronic diastolic (congestive) heart failure: Secondary | ICD-10-CM | POA: Diagnosis present

## 2015-06-05 DIAGNOSIS — F419 Anxiety disorder, unspecified: Secondary | ICD-10-CM | POA: Diagnosis present

## 2015-06-05 DIAGNOSIS — F329 Major depressive disorder, single episode, unspecified: Secondary | ICD-10-CM | POA: Diagnosis present

## 2015-06-05 DIAGNOSIS — E039 Hypothyroidism, unspecified: Secondary | ICD-10-CM | POA: Diagnosis present

## 2015-06-05 DIAGNOSIS — Z880 Allergy status to penicillin: Secondary | ICD-10-CM | POA: Diagnosis not present

## 2015-06-05 DIAGNOSIS — R52 Pain, unspecified: Secondary | ICD-10-CM

## 2015-06-05 DIAGNOSIS — R6884 Jaw pain: Secondary | ICD-10-CM | POA: Diagnosis present

## 2015-06-05 DIAGNOSIS — H3530 Unspecified macular degeneration: Secondary | ICD-10-CM | POA: Diagnosis present

## 2015-06-05 DIAGNOSIS — I517 Cardiomegaly: Secondary | ICD-10-CM | POA: Diagnosis not present

## 2015-06-05 DIAGNOSIS — C50919 Malignant neoplasm of unspecified site of unspecified female breast: Secondary | ICD-10-CM | POA: Diagnosis present

## 2015-06-05 DIAGNOSIS — Z79899 Other long term (current) drug therapy: Secondary | ICD-10-CM

## 2015-06-05 DIAGNOSIS — I10 Essential (primary) hypertension: Secondary | ICD-10-CM | POA: Diagnosis present

## 2015-06-05 DIAGNOSIS — M5010 Cervical disc disorder with radiculopathy, unspecified cervical region: Secondary | ICD-10-CM | POA: Diagnosis present

## 2015-06-05 LAB — CBC WITH DIFFERENTIAL/PLATELET
Basophils Absolute: 0 10*3/uL (ref 0.0–0.1)
Basophils Absolute: 0 10*3/uL (ref 0.0–0.1)
Basophils Relative: 1 % (ref 0–1)
Basophils Relative: 1 % (ref 0–1)
EOS PCT: 2 % (ref 0–5)
EOS PCT: 2 % (ref 0–5)
Eosinophils Absolute: 0.1 10*3/uL (ref 0.0–0.7)
Eosinophils Absolute: 0.1 10*3/uL (ref 0.0–0.7)
HCT: 34.7 % — ABNORMAL LOW (ref 36.0–46.0)
HCT: 36.1 % (ref 36.0–46.0)
HEMOGLOBIN: 11.3 g/dL — AB (ref 12.0–15.0)
Hemoglobin: 11.6 g/dL — ABNORMAL LOW (ref 12.0–15.0)
LYMPHS ABS: 1.5 10*3/uL (ref 0.7–4.0)
LYMPHS PCT: 25 % (ref 12–46)
Lymphocytes Relative: 22 % (ref 12–46)
Lymphs Abs: 1.3 10*3/uL (ref 0.7–4.0)
MCH: 31.2 pg (ref 26.0–34.0)
MCH: 31.1 pg (ref 26.0–34.0)
MCHC: 32.6 g/dL (ref 30.0–36.0)
MCHC: 32.1 g/dL (ref 30.0–36.0)
MCV: 95.9 fL (ref 78.0–100.0)
MCV: 96.8 fL (ref 78.0–100.0)
MONO ABS: 0.7 10*3/uL (ref 0.1–1.0)
MONO ABS: 0.8 10*3/uL (ref 0.1–1.0)
MONOS PCT: 13 % — AB (ref 3–12)
MONOS PCT: 13 % — AB (ref 3–12)
Neutro Abs: 3.6 10*3/uL (ref 1.7–7.7)
Neutro Abs: 3.5 10*3/uL (ref 1.7–7.7)
Neutrophils Relative %: 62 % (ref 43–77)
Neutrophils Relative %: 59 % (ref 43–77)
Platelets: 143 10*3/uL — ABNORMAL LOW (ref 150–400)
Platelets: DECREASED 10*3/uL (ref 150–400)
RBC: 3.62 MIL/uL — ABNORMAL LOW (ref 3.87–5.11)
RBC: 3.73 MIL/uL — ABNORMAL LOW (ref 3.87–5.11)
RDW: 14 % (ref 11.5–15.5)
RDW: 14.1 % (ref 11.5–15.5)
WBC: 5.8 10*3/uL (ref 4.0–10.5)
WBC: 5.9 10*3/uL (ref 4.0–10.5)

## 2015-06-05 LAB — COMPREHENSIVE METABOLIC PANEL
ALBUMIN: 3.7 g/dL (ref 3.5–5.0)
ALT: 26 U/L (ref 14–54)
AST: 27 U/L (ref 15–41)
Alkaline Phosphatase: 87 U/L (ref 38–126)
Anion gap: 10 (ref 5–15)
BUN: 29 mg/dL — ABNORMAL HIGH (ref 6–20)
CALCIUM: 9.1 mg/dL (ref 8.9–10.3)
CO2: 29 mmol/L (ref 22–32)
Chloride: 101 mmol/L (ref 101–111)
Creatinine, Ser: 0.99 mg/dL (ref 0.44–1.00)
GFR calc Af Amer: 54 mL/min — ABNORMAL LOW (ref >60)
GFR, EST NON AFRICAN AMERICAN: 46 mL/min — AB (ref >60)
Glucose, Bld: 107 mg/dL — ABNORMAL HIGH (ref 65–99)
Potassium: 4 mmol/L (ref 3.5–5.1)
SODIUM: 140 mmol/L (ref 135–145)
Total Bilirubin: 0.8 mg/dL (ref 0.3–1.2)
Total Protein: 7.6 g/dL (ref 6.5–8.1)

## 2015-06-05 LAB — TROPONIN I: TROPONIN I: 0.15 ng/mL — AB (ref <0.031)

## 2015-06-05 LAB — BRAIN NATRIURETIC PEPTIDE: B NATRIURETIC PEPTIDE 5: 758.9 pg/mL — AB (ref 0.0–100.0)

## 2015-06-05 MED ORDER — ASPIRIN 81 MG PO CHEW
162.0000 mg | CHEWABLE_TABLET | Freq: Once | ORAL | Status: AC
  Administered 2015-06-05: 162 mg via ORAL
  Filled 2015-06-05: qty 2

## 2015-06-05 MED ORDER — HEPARIN (PORCINE) IN NACL 100-0.45 UNIT/ML-% IJ SOLN
1000.0000 [IU]/h | INTRAMUSCULAR | Status: DC
  Filled 2015-06-05: qty 250

## 2015-06-05 MED ORDER — HEPARIN BOLUS VIA INFUSION: 4000.0000 [IU] | Freq: Once | INTRAVENOUS | Status: DC

## 2015-06-05 NOTE — ED Provider Notes (Signed)
CSN: [id number]     Arrival date & time 06/05/15  1421 History  This chart was scribed for Alfonzo Beers, MD by Chester Holstein, ED Scribe. This patient was seen in room MH08/MH08 and the patient's care was started at 3:15 PM.    Chief Complaint  Patient presents with  . Jaw Pain     The history is provided by the patient and a relative. No language interpreter was used.   HPI Comments: Kathy Hoover is a 81 y.o. female with PMHx of hypothyroidism, HTN, moderate aortic stenosis, breast CA, and chronic diastolic CHF who presents to the Emergency Department complaining of aching jaw pain described as "teeth pain" with onset this morning lasting approximately an hour. Pt took 2 baby aspirin with relief. Pt with h/o angina and states she had jaw pain at that time. Pt's cardiologist is Dr. Radford Pax. Pt denies SOB, nausea, chest pain, and diaphoresis.   Past Medical History  Diagnosis Date  . Lower extremity edema     CHRONIC  . Hypothyroidism   . Anxiety and depression   . Hypertension   . Moderate aortic stenosis 05/2012    by ECHO  . Cervical disc disease     W radiculopathy S/P epidural injections  . Vitamin D deficiency   . Macular degeneration     Dr Hillis Range Follows for pt.  . Anxiety   . Depression   . Exertional shortness of breath     "worse recently" (01/13/2014)  . Pneumonia     "once before today" (01/13/2014)  . Bacterial pneumonia 01/13/2014  . Chronic bronchitis     "@ least once a year;  in the winter" (01/13/2014)  . Breast cancer   . Arthritis     "terribly in my hands" (01/13/2014)  . Chronic diastolic CHF (congestive heart failure)     Preserved EF   Past Surgical History  Procedure Laterality Date  . Cataract extraction w/ intraocular lens  implant, bilateral Bilateral ~ 2008  . Tonsillectomy  1949  . Abdominal hysterectomy    . Appendectomy  1949  . Breast biopsy Right ~ 2005  . Breast lumpectomy Right ~ 2005   Family History  Problem Relation Age of Onset  .  CVA Father   . Pneumonia Father   . Multiple sclerosis Brother   . CVA Brother    History  Substance Use Topics  . Smoking status: Never Smoker   . Smokeless tobacco: Never Used  . Alcohol Use: No   OB History    No data available     Review of Systems  Constitutional: Negative for diaphoresis.  HENT:       Jaw pain  Respiratory: Negative for shortness of breath.   Cardiovascular: Negative for chest pain.  Gastrointestinal: Negative for nausea.  All other systems reviewed and are negative.     Allergies  Other; Codeine; and Penicillins  Home Medications   Prior to Admission medications   Medication Sig Start Date End Date Taking? Authorizing Provider  acetaminophen (TYLENOL) 325 MG tablet Take 2 tablets (650 mg total) by mouth every 6 (six) hours as needed (pain or Fever >/= 101). 01/18/14   Modena Jansky, MD  albuterol (PROVENTIL HFA;VENTOLIN HFA) 108 (90 BASE) MCG/ACT inhaler Inhale 1-2 puffs into the lungs every 6 (six) hours as needed for wheezing or shortness of breath. 01/18/14   Modena Jansky, MD  Cholecalciferol 50000 UNITS capsule Take 50,000 Units by mouth every Wednesday.  Historical Provider, MD  furosemide (LASIX) 40 MG tablet Take 1 tablet (40 mg total) by mouth daily. 03/09/15   Sueanne Margarita, MD  levofloxacin (LEVAQUIN) 250 MG tablet Take 1 tablet (250 mg total) by mouth daily. 01/18/14   Modena Jansky, MD  levothyroxine (SYNTHROID, LEVOTHROID) 75 MCG tablet Take 75 mcg by mouth daily before breakfast.    Historical Provider, MD  Multiple Vitamin (MULTIVITAMIN WITH MINERALS) TABS Take 1 tablet by mouth daily.    Historical Provider, MD  Multiple Vitamins-Minerals (PRESERVISION AREDS PO) Take 2 capsules by mouth daily.     Historical Provider, MD  pindolol (VISKEN) 5 MG tablet Take 1 tablet (5 mg total) by mouth daily. 07/21/14   Sueanne Margarita, MD  Polyethyl Glycol-Propyl Glycol (SYSTANE OP) Apply 1-2 drops to eye 4 (four) times daily as needed (dry  eyes).    Historical Provider, MD  potassium chloride (K-DUR) 10 MEQ tablet Take 10 mEq by mouth daily.    Historical Provider, MD  vitamin C (ASCORBIC ACID) 500 MG tablet Take 500 mg by mouth daily.    Historical Provider, MD   BP 127/60 mmHg  Pulse 60  Temp(Src) 98.3 F (36.8 C)  Resp 20  Ht 4\' 11"  (1.499 m)  Wt 165 lb (74.844 kg)  BMI 33.31 kg/m2  SpO2 99% Physical Exam  Constitutional: She is oriented to person, place, and time. She appears well-developed and well-nourished.  HENT:  Head: Normocephalic.  Eyes: Conjunctivae are normal.  Neck: Normal range of motion. Neck supple.  Pulmonary/Chest: Effort normal.  Musculoskeletal: Normal range of motion.  Neurological: She is alert and oriented to person, place, and time.  Skin: Skin is warm and dry.  Psychiatric: She has a normal mood and affect. Her behavior is normal.  Nursing note and vitals reviewed. note- awake, alert, NA, lungs CTAB, CV RRR no murmur, abdomen- soft, nontender, no lower extremity pitting edema- compression socks in place  ED Course  Procedures (including critical care time) DIAGNOSTIC STUDIES: Oxygen Saturation is 96% on room air, normal by my interpretation.    COORDINATION OF CARE: 3:19 PM Discussed treatment plan with patient at beside, the patient agrees with the plan and has no further questions at this time.   Labs Review Labs Reviewed  TROPONIN I - Abnormal; Notable for the following:    Troponin I 0.15 (*)    All other components within normal limits  CBC WITH DIFFERENTIAL/PLATELET - Abnormal; Notable for the following:    RBC 3.62 (*)    Hemoglobin 11.3 (*)    HCT 34.7 (*)    Platelets 143 (*)    Monocytes Relative 13 (*)    All other components within normal limits  COMPREHENSIVE METABOLIC PANEL - Abnormal; Notable for the following:    Glucose, Bld 107 (*)    BUN 29 (*)    GFR calc non Af Amer 46 (*)    GFR calc Af Amer 54 (*)    All other components within normal limits  BRAIN  NATRIURETIC PEPTIDE - Abnormal; Notable for the following:    B Natriuretic Peptide 758.9 (*)    All other components within normal limits  CBC WITH DIFFERENTIAL/PLATELET - Abnormal; Notable for the following:    RBC 3.73 (*)    Hemoglobin 11.6 (*)    Monocytes Relative 13 (*)    All other components within normal limits    Imaging Review Dg Chest 2 View  06/06/2015   CLINICAL DATA:  Elevated  troponin levels  EXAM: CHEST  2 VIEW  COMPARISON:  06/05/2015  FINDINGS: The heart is moderately enlarged. Normal vascularity. Hyperaeration. No pneumothorax or pleural effusion.  IMPRESSION: Cardiomegaly without decompensation.   Electronically Signed   By: Marybelle Killings M.D.   On: 06/06/2015 20:54   Dg Chest 2 View  06/05/2015   CLINICAL DATA:  Bilateral jaw pain this morning. History of hypertension and CHF.  EXAM: CHEST  2 VIEW  COMPARISON:  03/07/2015  FINDINGS: Cardiac silhouette remains enlarged, unchanged. Dense mitral annular calcifications and thoracic aortic calcification are again seen. Lungs remain hyperinflated. No confluent airspace opacity, pulmonary edema, pleural effusion, or pneumothorax is identified. The surgical clips overlie the right axilla and right breast. Lumbar levoscoliosis is partially visualized.  IMPRESSION: Hyperinflation and cardiomegaly without evidence active cardiopulmonary disease.   Electronically Signed   By: Logan Bores   On: 06/05/2015 15:21     EKG Interpretation   Date/Time:  Sunday June 05 2015 14:37:34 EDT Ventricular Rate:  93 PR Interval:    QRS Duration: 80 QT Interval:  374 QTC Calculation: 465 R Axis:   -26 Text Interpretation:  Atrial fibrillation Low voltage QRS Cannot rule out  Anterior infarct , age undetermined Abnormal ECG Since previous tracing  afib is now present Confirmed by Canary Brim  MD, [place] [phone]) on 06/05/2015  3:37:57 PM      MDM   Final diagnoses:  Pain  jaw pain nstemi  I personally performed the services described in  this documentation, which was scribed in my presence. The recorded information has been reviewed and is accurate.   4:24 PM d/w Dr. Acie Fredrickson, cardiology he accepts patient for admission to telemetry bed.  Requests start of heparin, pt remains pain free.    5:09 PM I have had a long discussion with patient and family- patient does not want to stay in the hospital.  I have discussed all results, elevated troponin and its significance- risk of arrythmia, worsening heart damage, death.  They are discussing whether to leave or stay.  Pt is alert and oriented and does have the capacity to decide- will need to sign out AMA if she decides to leave.    Ultimately patient and family did decide to leave the ED and declined admission.  I made every effort to convince them to have patient stay/be transferred to [facility] for cardiology evaluation but she declined.  She states she feels fine and does not wish to stay.  Would prefer to call her cardiologist tomorrow.    Alfonzo Beers, MD 06/07/15 [phone]

## 2015-06-05 NOTE — ED Notes (Signed)
Pt c/o bil jaw pain this am , denies n/v or chest pain , PTA Asprin x 2 tabs

## 2015-06-05 NOTE — ED Notes (Signed)
Family stated that they did not want to admit Kathy Hoover to the hospital, MD discussed in detail the risk factors in not being admitted and discussed the current situation and lab results. Family discussed the risk factors among themselves and stated they wanted to leave and would call her cardiologist in the morning

## 2015-06-06 ENCOUNTER — Observation Stay (HOSPITAL_COMMUNITY)
Admission: EM | Admit: 2015-06-06 | Discharge: 2015-06-07 | Disposition: A | Discharge status: Home / Self Care | Payer: Medicare Other | Attending: Internal Medicine | Admitting: Internal Medicine

## 2015-06-06 ENCOUNTER — Telehealth: Payer: Self-pay | Admitting: Cardiology

## 2015-06-06 ENCOUNTER — Encounter (HOSPITAL_COMMUNITY): Payer: Self-pay | Admitting: Emergency Medicine

## 2015-06-06 ENCOUNTER — Emergency Department (HOSPITAL_COMMUNITY): Payer: Medicare Other

## 2015-06-06 DIAGNOSIS — I5032 Chronic diastolic (congestive) heart failure: Secondary | ICD-10-CM | POA: Diagnosis present

## 2015-06-06 DIAGNOSIS — R6884 Jaw pain: Principal | ICD-10-CM | POA: Insufficient documentation

## 2015-06-06 DIAGNOSIS — I350 Nonrheumatic aortic (valve) stenosis: Secondary | ICD-10-CM

## 2015-06-06 DIAGNOSIS — E039 Hypothyroidism, unspecified: Secondary | ICD-10-CM | POA: Diagnosis not present

## 2015-06-06 DIAGNOSIS — N183 Chronic kidney disease, stage 3 (moderate): Secondary | ICD-10-CM | POA: Insufficient documentation

## 2015-06-06 DIAGNOSIS — I480 Paroxysmal atrial fibrillation: Secondary | ICD-10-CM | POA: Insufficient documentation

## 2015-06-06 DIAGNOSIS — I252 Old myocardial infarction: Secondary | ICD-10-CM | POA: Diagnosis not present

## 2015-06-06 DIAGNOSIS — I129 Hypertensive chronic kidney disease with stage 1 through stage 4 chronic kidney disease, or unspecified chronic kidney disease: Secondary | ICD-10-CM | POA: Diagnosis not present

## 2015-06-06 DIAGNOSIS — R079 Chest pain, unspecified: Secondary | ICD-10-CM | POA: Diagnosis not present

## 2015-06-06 DIAGNOSIS — M1611 Unilateral primary osteoarthritis, right hip: Secondary | ICD-10-CM | POA: Diagnosis not present

## 2015-06-06 DIAGNOSIS — R0789 Other chest pain: Secondary | ICD-10-CM | POA: Diagnosis not present

## 2015-06-06 DIAGNOSIS — I517 Cardiomegaly: Secondary | ICD-10-CM | POA: Insufficient documentation

## 2015-06-06 DIAGNOSIS — Z853 Personal history of malignant neoplasm of breast: Secondary | ICD-10-CM | POA: Insufficient documentation

## 2015-06-06 DIAGNOSIS — I214 Non-ST elevation (NSTEMI) myocardial infarction: Secondary | ICD-10-CM | POA: Diagnosis not present

## 2015-06-06 DIAGNOSIS — I10 Essential (primary) hypertension: Secondary | ICD-10-CM | POA: Diagnosis present

## 2015-06-06 LAB — CBC
HCT: 37.3 % (ref 36.0–46.0)
Hemoglobin: 12.3 g/dL (ref 12.0–15.0)
MCH: 31.5 pg (ref 26.0–34.0)
MCHC: 33 g/dL (ref 30.0–36.0)
MCV: 95.4 fL (ref 78.0–100.0)
PLATELETS: 141 10*3/uL — AB (ref 150–400)
RBC: 3.91 MIL/uL (ref 3.87–5.11)
RDW: 14.2 % (ref 11.5–15.5)
WBC: 6.7 10*3/uL (ref 4.0–10.5)

## 2015-06-06 LAB — BASIC METABOLIC PANEL
Anion gap: 10 (ref 5–15)
BUN: 28 mg/dL — ABNORMAL HIGH (ref 6–20)
CALCIUM: 9.7 mg/dL (ref 8.9–10.3)
CO2: 31 mmol/L (ref 22–32)
Chloride: 100 mmol/L — ABNORMAL LOW (ref 101–111)
Creatinine, Ser: 1.23 mg/dL — ABNORMAL HIGH (ref 0.44–1.00)
GFR calc Af Amer: 41 mL/min — ABNORMAL LOW (ref >60)
GFR, EST NON AFRICAN AMERICAN: 36 mL/min — AB (ref >60)
Glucose, Bld: 105 mg/dL — ABNORMAL HIGH (ref 65–99)
Potassium: 4.2 mmol/L (ref 3.5–5.1)
Sodium: 141 mmol/L (ref 135–145)

## 2015-06-06 LAB — I-STAT TROPONIN, ED: TROPONIN I, POC: 0.12 ng/mL — AB (ref 0.00–0.08)

## 2015-06-06 MED ORDER — SODIUM CHLORIDE 0.9 % IJ SOLN: 3.0000 mL | INTRAMUSCULAR | Status: DC | PRN

## 2015-06-06 MED ORDER — ACETAMINOPHEN 325 MG PO TABS: 650.0000 mg | ORAL_TABLET | Freq: Four times a day (QID) | ORAL | Status: DC | PRN

## 2015-06-06 MED ORDER — ONDANSETRON HCL 4 MG/2ML IJ SOLN: 4.0000 mg | Freq: Four times a day (QID) | INTRAMUSCULAR | Status: DC | PRN

## 2015-06-06 MED ORDER — ALBUTEROL SULFATE (2.5 MG/3ML) 0.083% IN NEBU: 3.0000 mL | INHALATION_SOLUTION | Freq: Four times a day (QID) | RESPIRATORY_TRACT | Status: DC | PRN

## 2015-06-06 MED ORDER — POTASSIUM CHLORIDE ER 10 MEQ PO TBCR
10.0000 meq | EXTENDED_RELEASE_TABLET | Freq: Every day | ORAL | Status: DC
  Administered 2015-06-07: 10 meq via ORAL
  Filled 2015-06-06: qty 1

## 2015-06-06 MED ORDER — SODIUM CHLORIDE 0.9 % IJ SOLN
3.0000 mL | Freq: Two times a day (BID) | INTRAMUSCULAR | Status: DC
  Administered 2015-06-07: 3 mL via INTRAVENOUS

## 2015-06-06 MED ORDER — LEVOTHYROXINE SODIUM 75 MCG PO TABS
75.0000 ug | ORAL_TABLET | Freq: Every day | ORAL | Status: DC
  Administered 2015-06-07: 75 ug via ORAL
  Filled 2015-06-06 (×2): qty 1

## 2015-06-06 MED ORDER — NITROGLYCERIN 0.4 MG SL SUBL: 0.4000 mg | SUBLINGUAL_TABLET | SUBLINGUAL | Status: DC | PRN

## 2015-06-06 MED ORDER — POLYVINYL ALCOHOL 1.4 % OP SOLN
1.0000 [drp] | Freq: Four times a day (QID) | OPHTHALMIC | Status: DC | PRN
  Filled 2015-06-06: qty 15

## 2015-06-06 MED ORDER — SODIUM CHLORIDE 0.9 % IV SOLN: 250.0000 mL | INTRAVENOUS | Status: DC | PRN

## 2015-06-06 MED ORDER — ONDANSETRON HCL 4 MG PO TABS: 4.0000 mg | ORAL_TABLET | Freq: Four times a day (QID) | ORAL | Status: DC | PRN

## 2015-06-06 MED ORDER — FUROSEMIDE 40 MG PO TABS
40.0000 mg | ORAL_TABLET | Freq: Every day | ORAL | Status: DC
  Administered 2015-06-07: 40 mg via ORAL
  Filled 2015-06-06: qty 1

## 2015-06-06 MED ORDER — ASPIRIN EC 81 MG PO TBEC
81.0000 mg | DELAYED_RELEASE_TABLET | Freq: Every day | ORAL | Status: DC
  Administered 2015-06-07: 81 mg via ORAL
  Filled 2015-06-06: qty 1

## 2015-06-06 MED ORDER — ENOXAPARIN SODIUM 30 MG/0.3ML ~~LOC~~ SOLN
30.0000 mg | SUBCUTANEOUS | Status: DC
  Administered 2015-06-07: 30 mg via SUBCUTANEOUS
  Filled 2015-06-06: qty 0.3

## 2015-06-06 MED ORDER — VITAMIN C 500 MG PO TABS
500.0000 mg | ORAL_TABLET | Freq: Every day | ORAL | Status: DC
  Administered 2015-06-07: 500 mg via ORAL
  Filled 2015-06-06: qty 1

## 2015-06-06 MED ORDER — ACETAMINOPHEN 325 MG PO TABS: 650.0000 mg | ORAL_TABLET | ORAL | Status: DC | PRN

## 2015-06-06 MED ORDER — PINDOLOL 5 MG PO TABS
5.0000 mg | ORAL_TABLET | Freq: Every day | ORAL | Status: DC
  Administered 2015-06-07: 5 mg via ORAL
  Filled 2015-06-06: qty 1

## 2015-06-06 MED ORDER — MAGNESIUM SULFATE 2 GM/50ML IV SOLN
2.0000 g | Freq: Once | INTRAVENOUS | Status: AC
Start: 2015-06-06 — End: 2015-06-07
  Administered 2015-06-06: 2 g via INTRAVENOUS
  Filled 2015-06-06: qty 50

## 2015-06-06 NOTE — H&P (Signed)
[facility] History and Physical  Kathy Hoover NLG:[id number] DOB: 1916-03-15 DOA: 06/06/2015  Referring physician: Dr. Tawnya Crook. PCP: Gavin Pound, MD   Chief Complaint: Jaw pain  HPI: Kathy Hoover is a 81 y.o. female with below past medical history which includes diastolic congestive heart failure, essential hypertension, paroxysmal atrial fibrillation and comes to the hospital after calling her cardiologist Dr. Rande Lawman, who recommended to her to come to the emergency department.Marland Kitchen Unfortunately, the consult was not performed today. Dr. Radford Pax has recommended for the patient to stay tonight, troponin trending and cardiology consult in AM  This is due to the patient having jaw in our, with mild chest discomfort and no other reported symptoms Sunday morning at 0900 while she was at home.This episode lasted about an hour and disappeared either spontaneously or with aspirin that the patient took upon getting the pain. She went yesterday to [facility] yesterday and was sent home after being seen there.  She is currently pain free and in no acute distress.     Review of Systems:  Constitutional:  No weight loss, night sweats, Fevers, chills, fatigue.  HEENT:  No headaches, Difficulty swallowing,Tooth/dental problems,Sore throat,  No sneezing, itching, ear ache, nasal congestion, post nasal drip,  Cardio-vascular:  No chest pain, Orthopnea, PND, swelling in lower extremities, anasarca, dizziness, palpitations  GI:  No heartburn, indigestion, abdominal pain, nausea, vomiting, diarrhea, change in bowel habits, loss of appetite  Resp:  No shortness of breath with exertion or at rest. No excess mucus, no productive cough, No non-productive cough, No coughing up of blood.No change in color of mucus.No wheezing.No chest wall deformity  Skin:  no rash or lesions.  GU:  no dysuria, change in color of urine, no urgency or frequency. No flank pain.  Musculoskeletal:  No  joint pain or swelling. No decreased range of motion. No back pain.  Psych:  No change in mood or affect. No depression or anxiety. No memory loss.   Past Medical History  Diagnosis Date  . Lower extremity edema     CHRONIC  . Hypothyroidism   . Anxiety and depression   . Hypertension   . Moderate aortic stenosis 05/2012    by ECHO  . Cervical disc disease     W radiculopathy S/P epidural injections  . Vitamin D deficiency   . Macular degeneration     Dr Hillis Range Follows for pt.  . Anxiety   . Depression   . Exertional shortness of breath     "worse recently" (01/13/2014)  . Pneumonia     "once before today" (01/13/2014)  . Bacterial pneumonia 01/13/2014  . Chronic bronchitis     "@ least once a year;  in the winter" (01/13/2014)  . Breast cancer   . Arthritis     "terribly in my hands" (01/13/2014)  . Chronic diastolic CHF (congestive heart failure)     Preserved EF   Past Surgical History  Procedure Laterality Date  . Cataract extraction w/ intraocular lens  implant, bilateral Bilateral ~ 2008  . Tonsillectomy  1949  . Abdominal hysterectomy    . Appendectomy  1949  . Breast biopsy Right ~ 2005  . Breast lumpectomy Right ~ 2005   Social History:  reports that she has never smoked. She has never used smokeless tobacco. She reports that she does not drink alcohol or use illicit drugs.  Allergies  Allergen Reactions  . Other Nausea Only    CIPRO?   Marland Kitchen  Codeine Swelling    Lip Swelling  . Penicillins Swelling    Lip swelling    Family History  Problem Relation Age of Onset  . CVA Father   . Pneumonia Father   . Multiple sclerosis Brother   . CVA Brother      Prior to Admission medications   Medication Sig Start Date End Date Taking? Authorizing Provider  acetaminophen (TYLENOL) 325 MG tablet Take 2 tablets (650 mg total) by mouth every 6 (six) hours as needed (pain or Fever >/= 101). 01/18/14  Yes Modena Jansky, MD  albuterol (PROVENTIL HFA;VENTOLIN HFA) 108 (90  BASE) MCG/ACT inhaler Inhale 1-2 puffs into the lungs every 6 (six) hours as needed for wheezing or shortness of breath. 01/18/14  Yes Modena Jansky, MD  Cholecalciferol 50000 UNITS capsule Take 50,000 Units by mouth every Wednesday.   Yes Historical Provider, MD  furosemide (LASIX) 40 MG tablet Take 1 tablet (40 mg total) by mouth daily. 03/09/15  Yes Sueanne Margarita, MD  levothyroxine (SYNTHROID, LEVOTHROID) 75 MCG tablet Take 75 mcg by mouth daily before breakfast.   Yes Historical Provider, MD  Multiple Vitamin (MULTIVITAMIN WITH MINERALS) TABS Take 1 tablet by mouth daily.   Yes Historical Provider, MD  Multiple Vitamins-Minerals (PRESERVISION AREDS PO) Take 2 capsules by mouth daily.    Yes Historical Provider, MD  pindolol (VISKEN) 5 MG tablet Take 1 tablet (5 mg total) by mouth daily. 07/21/14  Yes Sueanne Margarita, MD  Polyethyl Glycol-Propyl Glycol (SYSTANE OP) Apply 1-2 drops to eye 4 (four) times daily as needed (dry eyes).   Yes Historical Provider, MD  potassium chloride (K-DUR) 10 MEQ tablet Take 10 mEq by mouth daily.   Yes Historical Provider, MD  vitamin C (ASCORBIC ACID) 500 MG tablet Take 500 mg by mouth daily.   Yes Historical Provider, MD  levofloxacin (LEVAQUIN) 250 MG tablet Take 1 tablet (250 mg total) by mouth daily. 01/18/14   Modena Jansky, MD   Physical Exam: Filed Vitals:   06/06/15 2000 06/06/15 2021 06/06/15 2030 06/06/15 2100  BP: 164/68  158/71 177/48  Pulse: 61 66 59 63  Temp:      TempSrc:      Resp: 18 18 22 18   SpO2: 95% 94% 94% 94%    Wt Readings from Last 3 Encounters:  06/05/15 74.844 kg (165 lb)  03/07/15 78.472 kg (173 lb)  12/22/14 74.844 kg (165 lb)    General:  Appears calm and comfortable Eyes: PERRL, normal lids, irises & conjunctiva ENT: grossly normal hearing with aid devices, lips & tongue Neck: no LAD, masses or thyromegaly Cardiovascular: RRR, positive systolic murmur. No LE edema. Telemetry: SR, no arrhythmias  Respiratory: CTA  bilaterally, no w/r/r. Normal respiratory effort. Abdomen: soft, ntnd Skin: no rash or induration seen on limited exam Musculoskeletal: grossly normal tone BUE/BLE Psychiatric: grossly normal mood and affect, speech fluent and appropriate Neurologic: grossly non-focal.          Labs on Admission:  Basic Metabolic Panel:  Recent Labs Lab 06/05/15 1500 06/06/15 1915  NA 140 141  K 4.0 4.2  CL 101 100  CO2 29 31  GLUCOSE 107 105  BUN 29 28  CREATININE 0.99 1.23  CALCIUM 9.1 9.7   Liver Function Tests:  Recent Labs Lab 06/05/15 1500  AST 27  ALT 26  ALKPHOS 87  BILITOT 0.8  PROT 7.6  ALBUMIN 3.7   No results for input(s): LIPASE, AMYLASE in the  last 168 hours. No results for input(s): AMMONIA in the last 168 hours. CBC:  Recent Labs Lab 06/05/15 1500 06/05/15 1630 06/06/15 1915  WBC 5.8 5.9 6.7  NEUTROABS 3.6 3.5  --   HGB 11.3 11.6 12.3  HCT 34.7 36.1 37.3  MCV 95.9 96.8 95.4  PLT 143 PLATELET CLUMPS NOTED ON SMEAR, COUNT APPEARS DECREASED 141   Cardiac Enzymes:  Recent Labs Lab 06/05/15 1500  TROPONINI 0.15    BNP (last 3 results)  Recent Labs  06/05/15 1500  BNP 758.9    ProBNP (last 3 results)  Recent Labs  07/28/14 1142 08/13/14 1006 03/07/15 1240  PROBNP 508.0 392.0 584.0    CBG: No results for input(s): GLUCAP in the last 168 hours.  Radiological Exams on Admission: Dg Chest 2 View  06/06/2015   CLINICAL DATA:  Elevated troponin levels  EXAM: CHEST  2 VIEW  COMPARISON:  06/05/2015  FINDINGS: The heart is moderately enlarged. Normal vascularity. Hyperaeration. No pneumothorax or pleural effusion.  IMPRESSION: Cardiomegaly without decompensation.   Electronically Signed   By: Marybelle Killings M.D.   On: 06/06/2015 20:54   Dg Chest 2 View  06/05/2015   CLINICAL DATA:  Bilateral jaw pain this morning. History of hypertension and CHF.  EXAM: CHEST  2 VIEW  COMPARISON:  03/07/2015  FINDINGS: Cardiac silhouette remains enlarged,  unchanged. Dense mitral annular calcifications and thoracic aortic calcification are again seen. Lungs remain hyperinflated. No confluent airspace opacity, pulmonary edema, pleural effusion, or pneumothorax is identified. The surgical clips overlie the right axilla and right breast. Lumbar levoscoliosis is partially visualized.  IMPRESSION: Hyperinflation and cardiomegaly without evidence active cardiopulmonary disease.   Electronically Signed   By: Logan Bores   On: 06/05/2015 15:21    EKG: Independently reviewed.  06/06/2015  Vent. rate 61 BPM PR interval 180 ms QRS duration 88 ms QT/QTc 450/453 ms P-R-T axes 80 -16 61 Sinus rhythm with Premature atrial complexes Possible Anterior infarct , likely old Abnormal ECG.  06/04/2014 Atrial fibrillation with VR of 93 bpm. Low voltage QRS Cannot rule out Anterior infarct , age undetermined Abnormal ECG Since previous tracing  Assessment/Plan Principal Problem:   Chest pain radiating to jaw Active Problems:   Essential hypertension, benign   Acquired stenosis of aortic valve   Chronic diastolic heart failure   NSTEMI (non-ST elevated myocardial infarction)   Continue pertinent home meds, serial troponin levels, telemetry monitoring and will give the 2 g magnesium sulfate IV solution to the patient to decrease the likelihood of arrhythmias.  Dr. Rande Lawman was notified via page that the patient has decided to stay in the hospital to see her or one of her colleagues tomorrow. Dr. Radford Pax she subsequently responded and asked [place] to call for consult in the morning to whomever is available since she will not be in the hospital tomorrow.   Code Status: Full DVT Prophylaxis: Lovenox. Family Communication:  [place], [place] [phone]  [phone]  Disposition Plan: Home with outpatient follow-up.  Time spent: 70 or more minutes  Reubin Milan [facility] Pager [phone].

## 2015-06-06 NOTE — Telephone Encounter (Signed)
Given increased troponin she needs to go to the ER

## 2015-06-06 NOTE — ED Notes (Signed)
Pt made aware of bed assignment 

## 2015-06-06 NOTE — Telephone Encounter (Signed)
Per Dr. Radford Pax, instructed Betsie to take patient to the ED. Betsie agrees with treatment plan.

## 2015-06-06 NOTE — Telephone Encounter (Signed)
Betsie, patient's daught-in-law, called to make appointment with Dr. Radford Pax tomorrow. Patient went to [facility] yesterday for jaw pain where she was told her troponin was increased and that she was in a-fib occasionally on the monitor. She was advised to go to [facility] for observation for a night but signed AMA.  Betsie st to her knowledge, the patient has had no symptoms since going to Urgent Care. Her jaw pain had even resolved before she was seen. Betsie wants to schedule with Dr. Radford Pax ASAP. She also st that if Dr. Radford Pax recommended going to the hospital, her mother would go. Yesterday was a "tough day" and the patient was tired. She did not want to do anything she was told by a doctor who wasn't hers.  To Dr. Radford Pax for recommendations.

## 2015-06-06 NOTE — ED Notes (Signed)
Patient transported to X-ray 

## 2015-06-06 NOTE — ED Notes (Signed)
MD at bedside. 

## 2015-06-06 NOTE — ED Notes (Addendum)
The paitent said she went to [facility] and she was sent home.  The patient said she was having "jaw pain, and teeth pain".  She had blood drawn, a chest x-ray and given aspirin.  The patient was discharged.  Dr. Radford Pax contacted her son and asked them to come to the Emergency Room here so that she could see the patient.  She has no complaints today.  They did do an EKG at [facility] and she was in Afib but the patient has a history of afib and she says she has gone in and out of afib in previous years.

## 2015-06-06 NOTE — Telephone Encounter (Signed)
New Message     Pt daughter n-law wants pt to be seen tomorrow  Pt is NOT having any symptoms , pt he went to the ED in [facility] 06/05/15.

## 2015-06-07 ENCOUNTER — Observation Stay (HOSPITAL_COMMUNITY): Payer: Medicare Other

## 2015-06-07 DIAGNOSIS — I350 Nonrheumatic aortic (valve) stenosis: Secondary | ICD-10-CM | POA: Diagnosis not present

## 2015-06-07 DIAGNOSIS — R079 Chest pain, unspecified: Secondary | ICD-10-CM | POA: Diagnosis not present

## 2015-06-07 DIAGNOSIS — I5032 Chronic diastolic (congestive) heart failure: Secondary | ICD-10-CM

## 2015-06-07 DIAGNOSIS — I10 Essential (primary) hypertension: Secondary | ICD-10-CM

## 2015-06-07 DIAGNOSIS — R0600 Dyspnea, unspecified: Secondary | ICD-10-CM

## 2015-06-07 DIAGNOSIS — I129 Hypertensive chronic kidney disease with stage 1 through stage 4 chronic kidney disease, or unspecified chronic kidney disease: Secondary | ICD-10-CM | POA: Diagnosis not present

## 2015-06-07 DIAGNOSIS — R6884 Jaw pain: Secondary | ICD-10-CM | POA: Diagnosis not present

## 2015-06-07 DIAGNOSIS — R7989 Other specified abnormal findings of blood chemistry: Secondary | ICD-10-CM

## 2015-06-07 DIAGNOSIS — N183 Chronic kidney disease, stage 3 (moderate): Secondary | ICD-10-CM | POA: Diagnosis not present

## 2015-06-07 LAB — MAGNESIUM: MAGNESIUM: 3.1 mg/dL — AB (ref 1.7–2.4)

## 2015-06-07 LAB — TROPONIN I
Troponin I: 0.09 ng/mL — ABNORMAL HIGH (ref <0.031)
Troponin I: 0.08 ng/mL — ABNORMAL HIGH (ref <0.031)
Troponin I: 0.07 ng/mL — ABNORMAL HIGH (ref <0.031)

## 2015-06-07 LAB — CREATININE, SERUM
Creatinine, Ser: 1.4 mg/dL — ABNORMAL HIGH (ref 0.44–1.00)
GFR calc Af Amer: 35 mL/min — ABNORMAL LOW (ref >60)
GFR calc non Af Amer: 30 mL/min — ABNORMAL LOW (ref >60)

## 2015-06-07 LAB — CBC
HCT: 36.1 % (ref 36.0–46.0)
Hemoglobin: 11.6 g/dL — ABNORMAL LOW (ref 12.0–15.0)
MCH: 30.9 pg (ref 26.0–34.0)
MCHC: 32.1 g/dL (ref 30.0–36.0)
MCV: 96.3 fL (ref 78.0–100.0)
PLATELETS: 161 10*3/uL (ref 150–400)
RBC: 3.75 MIL/uL — AB (ref 3.87–5.11)
RDW: 14.1 % (ref 11.5–15.5)
WBC: 6.1 10*3/uL (ref 4.0–10.5)

## 2015-06-07 MED ORDER — ASPIRIN 81 MG PO TBEC: 81.0000 mg | DELAYED_RELEASE_TABLET | Freq: Every day | ORAL | Status: DC

## 2015-06-07 MED ORDER — AMLODIPINE BESYLATE 2.5 MG PO TABS: 2.5000 mg | ORAL_TABLET | Freq: Every day | ORAL | Status: DC

## 2015-06-07 NOTE — Progress Notes (Signed)
  Echocardiogram 2D Echocardiogram has been performed.  Jennette Dubin 06/07/2015, 3:34 PM

## 2015-06-07 NOTE — Progress Notes (Signed)
PROGRESS NOTE  Kathy Hoover WRU:[id number] DOB: Sep 04, 1916 DOA: 06/06/2015 PCP: Gavin Pound, MD  Brief history 81 year old female with a history of diastolic CHF, hypertension, paroxysmal atrial fibrillation, anxiety, breast cancer, moderate aortic stenosis presented to [facility] on 06/05/2015 with jaw pain. Patient stated that lasted approximately one hour. There was no associated chest discomfort, shortness breath, dizziness, headache, fevers, chills admission was recommended on 06/05/2015, but the patient refused and went home. She call her cardiologist, Dr. Radford Hoover, who recommended patient to the hospital for further evaluation. It was noted that the patient had a troponin of 0.15 on 06/05/2015. Assessment/Plan: Jaw Pain/elevated troponin  -??angina Equivalent -Troponin trend appears to be flat  0.15 on 7/24 -7/26 troponins 0.09-->0.08 -No chest discomfort presently -Chest x-ray without any pulmonary edema or infiltrates -EKG with nonspecific T-wave change -Consult cardiology moderate aortic stenosis -Seen on echocardiogram 03/04/2014 -Repeat echocardiogram CKD stage III -Baseline creatinine 9.1-4.7 Chronic diastolic CHF -Clinically compensated at this time -Continue furosemide 40 mg daily -Continue home dose of beta blocker -Continue daily potassium supplementation  chronic lower extremity edema  -Continue TED hose and furosemide  Chronic shortness of breath  -The patient uses oxygen 2 L when necessary  -Presently stable on room air  -Chest x-ray without infiltrates or pulmonary edema     Family Communication:   Son updated at beside Disposition Plan:   Home when medically stable      Procedures/Studies: Dg Chest 2 View  06/06/2015   CLINICAL DATA:  Elevated troponin levels  EXAM: CHEST  2 VIEW  COMPARISON:  06/05/2015  FINDINGS: The heart is moderately enlarged. Normal vascularity. Hyperaeration. No pneumothorax or pleural effusion.  IMPRESSION:  Cardiomegaly without decompensation.   Electronically Signed   By: Marybelle Killings M.D.   On: 06/06/2015 20:54   Dg Chest 2 View  06/05/2015   CLINICAL DATA:  Bilateral jaw pain this morning. History of hypertension and CHF.  EXAM: CHEST  2 VIEW  COMPARISON:  03/07/2015  FINDINGS: Cardiac silhouette remains enlarged, unchanged. Dense mitral annular calcifications and thoracic aortic calcification are again seen. Lungs remain hyperinflated. No confluent airspace opacity, pulmonary edema, pleural effusion, or pneumothorax is identified. The surgical clips overlie the right axilla and right breast. Lumbar levoscoliosis is partially visualized.  IMPRESSION: Hyperinflation and cardiomegaly without evidence active cardiopulmonary disease.   Electronically Signed   By: Logan Bores   On: 06/05/2015 15:21        Subjective: Patient denies fevers, chills, headache, chest pain, dyspnea, nausea, vomiting, diarrhea, abdominal pain, dysuria, hematuria   Objective: Filed Vitals:   06/06/15 2153 06/06/15 2249 06/06/15 2300 06/07/15 0323  BP: 160/62 133/78  168/70  Pulse: 64 61  62  Temp:  97.6 F (36.4 C)  97.6 F (36.4 C)  TempSrc:  Oral  Oral  Resp: 22 16  18   Height:   4\' 11"  (1.499 m)   Weight:   78.6 kg (173 lb 4.5 oz) 76.9 kg (169 lb 8.5 oz)  SpO2: 95% 95%  92%    Intake/Output Summary (Last 24 hours) at 06/07/15 1010 Last data filed at 06/07/15 0327  Gross per 24 hour  Intake    170 ml  Output    400 ml  Net   -230 ml   Weight change:  Exam:   General:  Pt is alert, follows commands appropriately, not in acute distress  HEENT: No icterus, No thrush, No neck mass, [place]/AT  Cardiovascular: RRR, S1/S2, no rubs, no gallops  Respiratory: CTA bilaterally, no wheezing, no crackles, no rhonchi  Abdomen: Soft/+BS, non tender, non distended, no guarding; no hepatosplenomegaly  Extremities: 1+LE edema, No lymphangitis, No petechiae, No rashes, no synovitis no cyanosis or clubbing  Data  Reviewed: Basic Metabolic Panel:  Recent Labs Lab 06/05/15 1500 06/06/15 1915 06/07/15 0049  NA 140 141  --   K 4.0 4.2  --   CL 101 100*  --   CO2 29 31  --   GLUCOSE 107* 105*  --   BUN 29* 28*  --   CREATININE 0.99 1.23* 1.40*  CALCIUM 9.1 9.7  --   MG  --   --  3.1*   Liver Function Tests:  Recent Labs Lab 06/05/15 1500  AST 27  ALT 26  ALKPHOS 87  BILITOT 0.8  PROT 7.6  ALBUMIN 3.7   No results for input(s): LIPASE, AMYLASE in the last 168 hours. No results for input(s): AMMONIA in the last 168 hours. CBC:  Recent Labs Lab 06/05/15 1500 06/05/15 1630 06/06/15 1915 06/07/15 0049  WBC 5.8 5.9 6.7 6.1  NEUTROABS 3.6 3.5  --   --   HGB 11.3* 11.6* 12.3 11.6*  HCT 34.7* 36.1 37.3 36.1  MCV 95.9 96.8 95.4 96.3  PLT 143* PLATELET CLUMPS NOTED ON SMEAR, COUNT APPEARS DECREASED 141* 161   Cardiac Enzymes:  Recent Labs Lab 06/05/15 1500 06/07/15 0049 06/07/15 0503  TROPONINI 0.15* 0.09* 0.08*   BNP: Invalid input(s): POCBNP CBG: No results for input(s): GLUCAP in the last 168 hours.  No results found for this or any previous visit (from the past 240 hour(s)).   Scheduled Meds: . aspirin EC  81 mg Oral Daily  . enoxaparin (LOVENOX) injection  30 mg Subcutaneous Q24H  . furosemide  40 mg Oral Daily  . levothyroxine  75 mcg Oral QAC breakfast  . pindolol  5 mg Oral Daily  . potassium chloride  10 mEq Oral Daily  . sodium chloride  3 mL Intravenous Q12H  . sodium chloride  3 mL Intravenous Q12H  . vitamin C  500 mg Oral Daily   Continuous Infusions:    Aseem Sessums, DO  [facility] Pager [phone]  If 7PM-7AM, please contact night-coverage [email or web address] Password [email or web address] 06/07/2015, 10:10 AM

## 2015-06-07 NOTE — Discharge Summary (Signed)
Physician Discharge Summary  Helyn Schwan WGY:[id number] DOB: 1916-01-26 DOA: 06/06/2015  PCP: Gavin Pound, MD  Admit date: 06/06/2015 Discharge date: 06/07/2015  Recommendations for Outpatient Follow-up:  1. Pt will need to follow up with PCP in 2 weeks post discharge 2. Please obtain BMP 1-2  Weeks 3. Follow up Dr. Fransico Him in 1 week Discharge Diagnoses:  Jaw Pain/elevated troponin  -suspect angina Equivalent--pt was not taking ASA prior to admission -pt to start ASA 81 mg daily after d/c -Troponin trend appears to be flat 0.15 on 7/24 -7/26 troponins 0.09-->0.08 -No chest discomfort presently -Chest x-ray without any pulmonary edema or infiltrates -EKG with nonspecific T-wave change -Consult cardiology -case was discussed with Dr. Girtha Hake felt pt would be stable for discharge if echo did not show any wll motion abnormality or worsening of aortic stenosis -06/07/15 Echo--EF 55-60 percent, mild aortic stenosis, moderate aortic regurgitation -Follow up with Dr. Fransico Him in office moderate aortic stenosis -Seen on echocardiogram 03/04/2014 -Repeat echocardiogram-->mild aortic stenosis CKD stage III -Baseline creatinine 7.7-9.3 Chronic diastolic CHF -Clinically compensated at this time -Continue furosemide 40 mg daily -Continue home dose of beta blocker -Continue daily potassium supplementation chronic lower extremity edema  -Continue TED hose and furosemide  Chronic shortness of breath  -The patient uses oxygen 2 L when necessary  -Presently stable on room air  -Chest x-ray without infiltrates or pulmonary edema   Discharge Condition: stable  Disposition: home Follow-up Information    Follow up with Sueanne Margarita, MD In 1 week.   Specialty:  Cardiology   Contact information:   [address] [address] [phone]       Diet:heart healthy Wt Readings from Last 3 Encounters:  06/07/15 76.9 kg (169 lb 8.5 oz)    06/05/15 74.844 kg (165 lb)  03/07/15 78.472 kg (173 lb)    History of present illness:  81 year old female with a history of diastolic CHF, hypertension, paroxysmal atrial fibrillation, anxiety, breast cancer, moderate aortic stenosis presented to [facility] on 06/05/2015 with jaw pain. Patient stated that lasted approximately one hour. There was no associated chest discomfort, shortness breath, dizziness, headache, fevers, chills admission was recommended on 06/05/2015, but the patient refused and went home. She call her cardiologist, Dr. Radford Pax, who recommended patient to the hospital for further evaluation. It was noted that the patient had a troponin of 0.15 on 06/05/2015.  Her troponin trend was flat during the hospitalization. Cardiology was consulted. After discussion with the patient they felt that medical therapy was in the patient's best interest. The patient did not desire any invasive procedures. Echocardiogram was ordered. The case was discussed with Dr. Shelva Majestic whom stated that the patient could be discharged if the echocardiogram did not reveal any wall motion abnormalities or worsening aortic stenosis. Echocardiogram showed mild aortic stenosis without any wall motion abnormalities. The patient was discharged with instructions to take aspirin 81 mg daily and amlodipine 2.5 mg daily. She should continue on her pindolol.  Consultants: Cardiology  Discharge Exam: Filed Vitals:   06/07/15 1400  BP: 160/65  Pulse: 95  Temp: 97.8 F (36.6 C)  Resp: 18   Filed Vitals:   06/06/15 2300 06/07/15 0323 06/07/15 1118 06/07/15 1400  BP:  168/70 132/58 160/65  Pulse:  62 57 95  Temp:  97.6 F (36.4 C)  97.8 F (36.6 C)  TempSrc:  Oral  Oral  Resp:  18  18  Height: 4\' 11"  (1.499 m)  Weight: 78.6 kg (173 lb 4.5 oz) 76.9 kg (169 lb 8.5 oz)    SpO2:  92%  93%   General: A&O x 3, NAD, pleasant, cooperative Cardiovascular: RRR, no rub, no gallop, no S3 Respiratory:  CTAB, no wheeze, no rhonchi Abdomen:soft, nontender, nondistended, positive bowel sounds Extremities: 1+LE edema, No lymphangitis, no petechiae  Discharge Instructions      Discharge Instructions    Diet - low sodium heart healthy    Complete by:  As directed      Increase activity slowly    Complete by:  As directed             Medication List    STOP taking these medications        levofloxacin 250 MG tablet  Commonly known as:  LEVAQUIN     PRESERVISION AREDS PO      TAKE these medications        acetaminophen 325 MG tablet  Commonly known as:  TYLENOL  Take 2 tablets (650 mg total) by mouth every 6 (six) hours as needed (pain or Fever >/= 101).     albuterol 108 (90 BASE) MCG/ACT inhaler  Commonly known as:  PROVENTIL HFA;VENTOLIN HFA  Inhale 1-2 puffs into the lungs every 6 (six) hours as needed for wheezing or shortness of breath.     amLODipine 2.5 MG tablet  Commonly known as:  NORVASC  Take 1 tablet (2.5 mg total) by mouth daily.     aspirin 81 MG EC tablet  Take 1 tablet (81 mg total) by mouth daily.     Cholecalciferol 50000 UNITS capsule  Take 50,000 Units by mouth every Wednesday.     furosemide 40 MG tablet  Commonly known as:  LASIX  Take 1 tablet (40 mg total) by mouth daily.     levothyroxine 75 MCG tablet  Commonly known as:  SYNTHROID, LEVOTHROID  Take 75 mcg by mouth daily before breakfast.     multivitamin with minerals Tabs tablet  Take 1 tablet by mouth daily.     pindolol 5 MG tablet  Commonly known as:  VISKEN  Take 1 tablet (5 mg total) by mouth daily.     potassium chloride 10 MEQ tablet  Commonly known as:  K-DUR  Take 10 mEq by mouth daily.     SYSTANE OP  Apply 1-2 drops to eye 4 (four) times daily as needed (dry eyes).     vitamin C 500 MG tablet  Commonly known as:  ASCORBIC ACID  Take 500 mg by mouth daily.         The results of significant diagnostics from this hospitalization (including imaging,  microbiology, ancillary and laboratory) are listed below for reference.    Significant Diagnostic Studies: Dg Chest 2 View  06/06/2015   CLINICAL DATA:  Elevated troponin levels  EXAM: CHEST  2 VIEW  COMPARISON:  06/05/2015  FINDINGS: The heart is moderately enlarged. Normal vascularity. Hyperaeration. No pneumothorax or pleural effusion.  IMPRESSION: Cardiomegaly without decompensation.   Electronically Signed   By: Marybelle Killings M.D.   On: 06/06/2015 20:54   Dg Chest 2 View  06/05/2015   CLINICAL DATA:  Bilateral jaw pain this morning. History of hypertension and CHF.  EXAM: CHEST  2 VIEW  COMPARISON:  03/07/2015  FINDINGS: Cardiac silhouette remains enlarged, unchanged. Dense mitral annular calcifications and thoracic aortic calcification are again seen. Lungs remain hyperinflated. No confluent airspace opacity, pulmonary edema, pleural effusion, or pneumothorax is identified.  The surgical clips overlie the right axilla and right breast. Lumbar levoscoliosis is partially visualized.  IMPRESSION: Hyperinflation and cardiomegaly without evidence active cardiopulmonary disease.   Electronically Signed   By: Logan Bores   On: 06/05/2015 15:21     Microbiology: No results found for this or any previous visit (from the past 240 hour(s)).   Labs: Basic Metabolic Panel:  Recent Labs Lab 06/05/15 1500 06/06/15 1915 06/07/15 0049  NA 140 141  --   K 4.0 4.2  --   CL 101 100*  --   CO2 29 31  --   GLUCOSE 107* 105*  --   BUN 29* 28*  --   CREATININE 0.99 1.23* 1.40*  CALCIUM 9.1 9.7  --   MG  --   --  3.1*   Liver Function Tests:  Recent Labs Lab 06/05/15 1500  AST 27  ALT 26  ALKPHOS 87  BILITOT 0.8  PROT 7.6  ALBUMIN 3.7   No results for input(s): LIPASE, AMYLASE in the last 168 hours. No results for input(s): AMMONIA in the last 168 hours. CBC:  Recent Labs Lab 06/05/15 1500 06/05/15 1630 06/06/15 1915 06/07/15 0049  WBC 5.8 5.9 6.7 6.1  NEUTROABS 3.6 3.5  --   --     HGB 11.3* 11.6* 12.3 11.6*  HCT 34.7* 36.1 37.3 36.1  MCV 95.9 96.8 95.4 96.3  PLT 143* PLATELET CLUMPS NOTED ON SMEAR, COUNT APPEARS DECREASED 141* 161   Cardiac Enzymes:  Recent Labs Lab 06/05/15 1500 06/07/15 0049 06/07/15 0503 06/07/15 1125  TROPONINI 0.15* 0.09* 0.08* 0.07*   BNP: Invalid input(s): POCBNP CBG: No results for input(s): GLUCAP in the last 168 hours.  Time coordinating discharge:  Greater than 30 minutes  Signed:  Dealie Koelzer, DO [facility] Pager: [phone] 06/07/2015, 6:38 PM

## 2015-06-07 NOTE — Progress Notes (Signed)
Pt discharged home per W/C. Discharge instructions reviewed with pt and pt verbalized understanding. IV removed. Skin clean, dry & intact.

## 2015-06-07 NOTE — ED Provider Notes (Signed)
CSN: [id number]     Arrival date & time 06/06/15  1700 History   First MD Initiated Contact with Patient 06/06/15 1908     Chief Complaint  Patient presents with  . Abnormal Lab    The paitent said she went to [facility] and she was sent home.  The patient said she was having "jaw pain, and teeth pain".       (Consider location/radiation/quality/duration/timing/severity/associated sxs/prior Treatment) HPI Comments: Pt had about 1 hr episode of jaw pain yesterday, seen at [facility], had trop elevation but declined admission. Pt's cardiologist called her this afternoon and encouraged her to come ot ED to be seen by cardiology. NO further Jaw pain, no CP, SOB, n/v or diaphoresis.   The history is provided by the patient and a relative. No language interpreter was used.    Past Medical History  Diagnosis Date  . Lower extremity edema     CHRONIC  . Hypothyroidism   . Anxiety and depression   . Hypertension   . Moderate aortic stenosis 05/2012    by ECHO  . Cervical disc disease     W radiculopathy S/P epidural injections  . Vitamin D deficiency   . Macular degeneration     Dr Hillis Range Follows for pt.  . Anxiety   . Depression   . Exertional shortness of breath     "worse recently" (01/13/2014)  . Pneumonia     "once before today" (01/13/2014)  . Bacterial pneumonia 01/13/2014  . Chronic bronchitis     "@ least once a year;  in the winter" (01/13/2014)  . Breast cancer   . Arthritis     "terribly in my hands" (01/13/2014)  . Chronic diastolic CHF (congestive heart failure)     Preserved EF   Past Surgical History  Procedure Laterality Date  . Cataract extraction w/ intraocular lens  implant, bilateral Bilateral ~ 2008  . Tonsillectomy  1949  . Abdominal hysterectomy    . Appendectomy  1949  . Breast biopsy Right ~ 2005  . Breast lumpectomy Right ~ 2005   Family History  Problem Relation Age of Onset  . CVA Father   . Pneumonia Father   . Multiple sclerosis Brother    . CVA Brother    History  Substance Use Topics  . Smoking status: Never Smoker   . Smokeless tobacco: Never Used  . Alcohol Use: No   OB History    No data available     Review of Systems  Constitutional: Negative for fever, chills, diaphoresis, activity change, appetite change and fatigue.  HENT: Negative for congestion, facial swelling, rhinorrhea and sore throat.   Eyes: Negative for photophobia and discharge.  Respiratory: Negative for cough, chest tightness and shortness of breath.   Cardiovascular: Negative for chest pain, palpitations and leg swelling.  Gastrointestinal: Negative for nausea, vomiting, abdominal pain and diarrhea.  Endocrine: Negative for polydipsia and polyuria.  Genitourinary: Negative for dysuria, frequency, difficulty urinating and pelvic pain.  Musculoskeletal: Negative for back pain, arthralgias, neck pain and neck stiffness.  Skin: Negative for color change and wound.  Allergic/Immunologic: Negative for immunocompromised state.  Neurological: Negative for facial asymmetry, weakness, numbness and headaches.  Hematological: Does not bruise/bleed easily.  Psychiatric/Behavioral: Negative for confusion and agitation.      Allergies  Other; Codeine; and Penicillins  Home Medications   Prior to Admission medications   Medication Sig Start Date End Date Taking? Authorizing Provider  acetaminophen (TYLENOL) 325  MG tablet Take 2 tablets (650 mg total) by mouth every 6 (six) hours as needed (pain or Fever >/= 101). 01/18/14  Yes Modena Jansky, MD  albuterol (PROVENTIL HFA;VENTOLIN HFA) 108 (90 BASE) MCG/ACT inhaler Inhale 1-2 puffs into the lungs every 6 (six) hours as needed for wheezing or shortness of breath. 01/18/14  Yes Modena Jansky, MD  Cholecalciferol 50000 UNITS capsule Take 50,000 Units by mouth every Wednesday.   Yes Historical Provider, MD  furosemide (LASIX) 40 MG tablet Take 1 tablet (40 mg total) by mouth daily. 03/09/15  Yes Sueanne Margarita, MD  levothyroxine (SYNTHROID, LEVOTHROID) 75 MCG tablet Take 75 mcg by mouth daily before breakfast.   Yes Historical Provider, MD  Multiple Vitamin (MULTIVITAMIN WITH MINERALS) TABS Take 1 tablet by mouth daily.   Yes Historical Provider, MD  Multiple Vitamins-Minerals (PRESERVISION AREDS PO) Take 2 capsules by mouth daily.    Yes Historical Provider, MD  pindolol (VISKEN) 5 MG tablet Take 1 tablet (5 mg total) by mouth daily. 07/21/14  Yes Sueanne Margarita, MD  Polyethyl Glycol-Propyl Glycol (SYSTANE OP) Apply 1-2 drops to eye 4 (four) times daily as needed (dry eyes).   Yes Historical Provider, MD  potassium chloride (K-DUR) 10 MEQ tablet Take 10 mEq by mouth daily.   Yes Historical Provider, MD  vitamin C (ASCORBIC ACID) 500 MG tablet Take 500 mg by mouth daily.   Yes Historical Provider, MD  levofloxacin (LEVAQUIN) 250 MG tablet Take 1 tablet (250 mg total) by mouth daily. 01/18/14   Modena Jansky, MD   BP 168/70 mmHg  Pulse 62  Temp(Src) 97.6 F (36.4 C) (Oral)  Resp 18  Ht 4\' 11"  (1.499 m)  Wt 169 lb 8.5 oz (76.9 kg)  BMI 34.22 kg/m2  SpO2 92% Physical Exam  Constitutional: She is oriented to person, place, and time. She appears well-developed and well-nourished. No distress.  HENT:  Head: Normocephalic and atraumatic.  Mouth/Throat: No oropharyngeal exudate.  Eyes: Pupils are equal, round, and reactive to light.  Neck: Normal range of motion. Neck supple.  Cardiovascular: Normal rate, regular rhythm and normal heart sounds.  Exam reveals no gallop and no friction rub.   No murmur heard. Pulmonary/Chest: Effort normal and breath sounds normal. No respiratory distress. She has no wheezes. She has no rales.  Abdominal: Soft. Bowel sounds are normal. She exhibits no distension and no mass. There is no tenderness. There is no rebound and no guarding.  Musculoskeletal: Normal range of motion. She exhibits no edema or tenderness.  Neurological: She is alert and oriented to  person, place, and time.  Skin: Skin is warm and dry.  Psychiatric: She has a normal mood and affect.    ED Course  Procedures (including critical care time) Labs Review Labs Reviewed  BASIC METABOLIC PANEL - Abnormal; Notable for the following:    Chloride 100 (*)    Glucose, Bld 105 (*)    BUN 28 (*)    Creatinine, Ser 1.23 (*)    GFR calc non Af Amer 36 (*)    GFR calc Af Amer 41 (*)    All other components within normal limits  CBC - Abnormal; Notable for the following:    Platelets 141 (*)    All other components within normal limits  CBC - Abnormal; Notable for the following:    RBC 3.75 (*)    Hemoglobin 11.6 (*)    All other components within normal limits  CREATININE,  SERUM - Abnormal; Notable for the following:    Creatinine, Ser 1.40 (*)    GFR calc non Af Amer 30 (*)    GFR calc Af Amer 35 (*)    All other components within normal limits  TROPONIN I - Abnormal; Notable for the following:    Troponin I 0.09 (*)    All other components within normal limits  TROPONIN I - Abnormal; Notable for the following:    Troponin I 0.08 (*)    All other components within normal limits  MAGNESIUM - Abnormal; Notable for the following:    Magnesium 3.1 (*)    All other components within normal limits  I-STAT TROPOININ, ED - Abnormal; Notable for the following:    Troponin i, poc 0.12 (*)    All other components within normal limits  TROPONIN I    Imaging Review Dg Chest 2 View  06/06/2015   CLINICAL DATA:  Elevated troponin levels  EXAM: CHEST  2 VIEW  COMPARISON:  06/05/2015  FINDINGS: The heart is moderately enlarged. Normal vascularity. Hyperaeration. No pneumothorax or pleural effusion.  IMPRESSION: Cardiomegaly without decompensation.   Electronically Signed   By: Marybelle Killings M.D.   On: 06/06/2015 20:54   Dg Chest 2 View  06/05/2015   CLINICAL DATA:  Bilateral jaw pain this morning. History of hypertension and CHF.  EXAM: CHEST  2 VIEW  COMPARISON:  03/07/2015   FINDINGS: Cardiac silhouette remains enlarged, unchanged. Dense mitral annular calcifications and thoracic aortic calcification are again seen. Lungs remain hyperinflated. No confluent airspace opacity, pulmonary edema, pleural effusion, or pneumothorax is identified. The surgical clips overlie the right axilla and right breast. Lumbar levoscoliosis is partially visualized.  IMPRESSION: Hyperinflation and cardiomegaly without evidence active cardiopulmonary disease.   Electronically Signed   By: Logan Bores   On: 06/05/2015 15:21     EKG Interpretation   Date/Time:  Monday June 06 2015 19:03:47 EDT Ventricular Rate:  61 PR Interval:  180 QRS Duration: 88 QT Interval:  450 QTC Calculation: 453 R Axis:   -16 Text Interpretation:  Sinus rhythm with Premature atrial complexes  Possible Anterior infarct , likely old Abnormal ECG Prior EKG in afib.   Confirmed by DOCHERTY  MD, [place] [phone]) on 06/06/2015 7:09:04 PM      MDM   Final diagnoses:  Chest pain radiating to jaw  NSTEMI (non-ST elevated myocardial infarction)    Pt is a 81 y.o. female with Pmhx as above who presents with episode of jaw pain yesterday. Pt seen at [facility], had elevated trop, but declined admission. She was called by her cardiologist and encouraged to come ot the ED to be seen by cardiology. She has ha no more jaw pain, no other complaints. She states she does not wish to be admitted, however states that if there was concern she was having a heart attack she would consider it. Trop remains mildly elevated, but not worsened. Pt found to be in afib yesterday, is in sinus rhythm today. Family concerned about about lack anticoagulants, and lack of antianginals.  Cardiology fellow agrees to see pt in dept, will admit for observation.       Ernestina Patches, MD 06/07/15 [phone]

## 2015-06-07 NOTE — Consult Note (Signed)
Cardiology Consultation     Patient ID: Kathy Hoover, [id number], 1916-06-18 Admit date: 02/27/2015 Date of Consult: 02/28/2015  Primary Physician: Dr. Farris Has Primary Cardiologist: Dr. Alfonzo Feller Referring Physician:  Dr. Olevia Bowens  Chief Complaint: jaw pain Reason for Consultation:  Positive troponin  HPI:  Ms. Kathy Hoover is a 81 year old female with a history of diastolic CHF, hypertension, paroxysmal atrial fibrillation, anxiety, breast cancer, and moderate aortic stenosis who presented to [facility] on 06/05/2015 with jaw pain. Patient stated that lasted approximately one hour. There was no associated chest discomfort, shortness breath, dizziness, headache, fevers, chills admission was recommended on 06/05/2015, but the patient refused and went home. She call her cardiologist, Dr. Radford Pax, who recommended the  patient go the the hospital for further evaluation.  Initial troponin on 06/05/2015 was positive at 0.15 .  Subsequent, she has not had any recurrence of this jaw discomfort.  She denies any chest pressure.  She denies any PND orthopnea.  She denies any exertional precipitation of chest pain.  There is no history of presyncope/syncope.  Subsequent troponins have been mildly positive with a flat curve.  Cardiology consultation was requested.  The patient currently is residing at a retirement facility in [facility].  She is originally from [place] and had lived in [place], [place] prior to moving to [place].  She is 81 years old.  Upon further questioning when asked specifically regarding potential aggressive therapy for her aortic valve or potential coronary artery disease, she prefers and medical management approach.   Past Medical History  Diagnosis Date  . Lower extremity edema     CHRONIC  . Hypothyroidism   . Anxiety and depression   . Hypertension   . Moderate aortic stenosis 05/2012    by ECHO  . Cervical disc disease     W radiculopathy S/P  epidural injections  . Vitamin D deficiency   . Macular degeneration     Dr Hillis Range Follows for pt.  . Anxiety   . Depression   . Exertional shortness of breath     "worse recently" (01/13/2014)  . Pneumonia     "once before today" (01/13/2014)  . Bacterial pneumonia 01/13/2014  . Chronic bronchitis     "@ least once a year;  in the winter" (01/13/2014)  . Breast cancer   . Arthritis     "terribly in my hands" (01/13/2014)  . Chronic diastolic CHF (congestive heart failure)     Preserved EF   Past Surgical History  Procedure Laterality Date  . Cataract extraction w/ intraocular lens  implant, bilateral Bilateral ~ 2008  . Tonsillectomy  1949  . Abdominal hysterectomy    . Appendectomy  1949  . Breast biopsy Right ~ 2005  . Breast lumpectomy Right ~ 2005    FAMHx: Family History  Problem Relation Age of Onset  . CVA Father   . Pneumonia Father   . Multiple sclerosis Brother   . CVA Brother     SOCHx:  reports that she has never smoked. She has never used smokeless tobacco. She reports that she does not drink alcohol or use illicit drugs.  ALLERGIES: Allergies  Allergen Reactions  . Other Nausea Only    CIPRO?   Marland Kitchen Codeine Swelling    Lip Swelling  . Penicillins Swelling    Lip swelling     HOME MEDICATIONS: Prescriptions prior to admission  Medication Sig Dispense Refill Last Dose  . acetaminophen (TYLENOL) 325 MG tablet Take 2  tablets (650 mg total) by mouth every 6 (six) hours as needed (pain or Fever >/= 101).   Past Month at Unknown time  . albuterol (PROVENTIL HFA;VENTOLIN HFA) 108 (90 BASE) MCG/ACT inhaler Inhale 1-2 puffs into the lungs every 6 (six) hours as needed for wheezing or shortness of breath. 1 Inhaler 0 NOT USED  . Cholecalciferol 50000 UNITS capsule Take 50,000 Units by mouth every Wednesday.   Past Week at Unknown time  . furosemide (LASIX) 40 MG tablet Take 1 tablet (40 mg total) by mouth daily. 30 tablet 6 06/06/2015 at Unknown time  .  levothyroxine (SYNTHROID, LEVOTHROID) 75 MCG tablet Take 75 mcg by mouth daily before breakfast.   06/06/2015 at Unknown time  . Multiple Vitamin (MULTIVITAMIN WITH MINERALS) TABS Take 1 tablet by mouth daily.   06/06/2015 at Unknown time  . Multiple Vitamins-Minerals (PRESERVISION AREDS PO) Take 2 capsules by mouth daily.    06/06/2015 at Unknown time  . pindolol (VISKEN) 5 MG tablet Take 1 tablet (5 mg total) by mouth daily. 30 tablet 0 06/06/2015 at 1000  . Polyethyl Glycol-Propyl Glycol (SYSTANE OP) Apply 1-2 drops to eye 4 (four) times daily as needed (dry eyes).   Past Month at Unknown time  . potassium chloride (K-DUR) 10 MEQ tablet Take 10 mEq by mouth daily.   06/06/2015 at Unknown time  . vitamin C (ASCORBIC ACID) 500 MG tablet Take 500 mg by mouth daily.   06/06/2015 at Unknown time  . levofloxacin (LEVAQUIN) 250 MG tablet Take 1 tablet (250 mg total) by mouth daily. 2 tablet 0 Taking    HOSPITAL MEDICATIONS: . aspirin EC  81 mg Oral Daily  . enoxaparin (LOVENOX) injection  30 mg Subcutaneous Q24H  . furosemide  40 mg Oral Daily  . levothyroxine  75 mcg Oral QAC breakfast  . pindolol  5 mg Oral Daily  . potassium chloride  10 mEq Oral Daily  . sodium chloride  3 mL Intravenous Q12H  . sodium chloride  3 mL Intravenous Q12H  . vitamin C  500 mg Oral Daily    ROS General: Negative; No fevers, chills, or night sweats;  HEENT: Negative; No changes in vision or hearing, sinus congestion, difficulty swallowing Pulmonary: Negative; No cough, wheezing, shortness of breath, hemoptysis Cardiovascular: See HPI GI: Negative; No nausea, vomiting, diarrhea, or abdominal pain GU: Negative; No dysuria, hematuria, or difficulty voiding Musculoskeletal: Negative; no myalgias, joint pain, or weakness Hematologic/Oncology: Negative; no easy bruising, bleeding Endocrine: Negative; no heat/cold intolerance; no diabetes Neuro: Negative; no changes in balance, headaches Skin: Negative; No rashes or  skin lesions Psychiatric: Negative; No behavioral problems, depression Sleep: Negative; No snoring, daytime sleepiness, hypersomnolence, bruxism, restless legs, hypnogognic hallucinations, no cataplexy Other comprehensive 14 point system review is negative.  VITALS: Blood pressure 168/70, pulse 62, temperature 97.6 F (36.4 C), temperature source Oral, resp. rate 18, height 4' 11"  (1.499 m), weight 169 lb 8.5 oz (76.9 kg), SpO2 92 %.  PHYSICAL EXAM:    Physical Exam BP 168/70 mmHg  Pulse 62  Temp(Src) 97.6 F (36.4 C) (Oral)  Resp 18  Ht 4' 11"  (1.499 m)  Wt 169 lb 8.5 oz (76.9 kg)  BMI 34.22 kg/m2  SpO2 92% General: Alert, oriented, no distress.  Skin: normal turgor, no rashes, warm and dry HEENT: Normocephalic, atraumatic. Pupils equal round and reactive to light; sclera anicteric; extraocular muscles intact;  Nose without nasal septal hypertrophy Mouth/Parynx benign; Mallinpatti scale 3 Neck: No JVD, transmitted murmur Lungs:  clear to ausculatation and percussion; no wheezing or rales Chest wall: without tenderness to palpitation Heart: PMI not displaced, RRR, s1 s2 normal, 2-3/6 mid to late peaking systolic murmur in aortic area, no diastolic murmur, no rubs, gallops, thrills, or heaves Abdomen: soft, nontender; no hepatosplenomehaly, BS+; abdominal aorta nontender and not dilated by palpation. Back: no CVA tenderness Pulses 2+ Musculoskeletal: full range of motion, normal strength, no joint deformities Extremities:  Trace -1+ ankle/pedal edema; no clubbing cyanosis, Homan's sign negative  Neurologic: grossly nonfocal; Cranial nerves grossly wnl Psychologic: Normal mood and affect; very articulate in communication.   ECG (independently read by me): NSR at 61.  No significant STT changes  LABS: Results for orders placed or performed during the hospital encounter of 06/06/15 (from the past 48 hour(s))  Basic metabolic panel     Status: Abnormal   Collection Time:  06/06/15  7:15 PM  Result Value Ref Range   Sodium 141 135 - 145 mmol/L   Potassium 4.2 3.5 - 5.1 mmol/L   Chloride 100 (L) 101 - 111 mmol/L   CO2 31 22 - 32 mmol/L   Glucose, Bld 105 (H) 65 - 99 mg/dL   BUN 28 (H) 6 - 20 mg/dL   Creatinine, Ser 1.23 (H) 0.44 - 1.00 mg/dL   Calcium 9.7 8.9 - 10.3 mg/dL   GFR calc non Af Amer 36 (L) >60 mL/min   GFR calc Af Amer 41 (L) >60 mL/min    Comment: (NOTE) The eGFR has been calculated using the CKD EPI equation. This calculation has not been validated in all clinical situations. eGFR's persistently <60 mL/min signify possible Chronic Kidney Disease.    Anion gap 10 5 - 15  CBC     Status: Abnormal   Collection Time: 06/06/15  7:15 PM  Result Value Ref Range   WBC 6.7 4.0 - 10.5 K/uL   RBC 3.91 3.87 - 5.11 MIL/uL   Hemoglobin 12.3 12.0 - 15.0 g/dL   HCT 37.3 36.0 - 46.0 %   MCV 95.4 78.0 - 100.0 fL   MCH 31.5 26.0 - 34.0 pg   MCHC 33.0 30.0 - 36.0 g/dL   RDW 14.2 11.5 - 15.5 %   Platelets 141 (L) 150 - 400 K/uL  I-stat troponin, ED     Status: Abnormal   Collection Time: 06/06/15  7:22 PM  Result Value Ref Range   Troponin i, poc 0.12 (HH) 0.00 - 0.08 ng/mL   Comment NOTIFIED PHYSICIAN    Comment 3            Comment: Due to the release kinetics of cTnI, a negative result within the first hours of the onset of symptoms does not rule out myocardial infarction with certainty. If myocardial infarction is still suspected, repeat the test at appropriate intervals.   CBC     Status: Abnormal   Collection Time: 06/07/15 12:49 AM  Result Value Ref Range   WBC 6.1 4.0 - 10.5 K/uL   RBC 3.75 (L) 3.87 - 5.11 MIL/uL   Hemoglobin 11.6 (L) 12.0 - 15.0 g/dL   HCT 36.1 36.0 - 46.0 %   MCV 96.3 78.0 - 100.0 fL   MCH 30.9 26.0 - 34.0 pg   MCHC 32.1 30.0 - 36.0 g/dL   RDW 14.1 11.5 - 15.5 %   Platelets 161 150 - 400 K/uL  Creatinine, serum     Status: Abnormal   Collection Time: 06/07/15 12:49 AM  Result Value Ref Range  Creatinine,  Ser 1.40 (H) 0.44 - 1.00 mg/dL   GFR calc non Af Amer 30 (L) >60 mL/min   GFR calc Af Amer 35 (L) >60 mL/min    Comment: (NOTE) The eGFR has been calculated using the CKD EPI equation. This calculation has not been validated in all clinical situations. eGFR's persistently <60 mL/min signify possible Chronic Kidney Disease.   Troponin I     Status: Abnormal   Collection Time: 06/07/15 12:49 AM  Result Value Ref Range   Troponin I 0.09 (H) <0.031 ng/mL    Comment:        PERSISTENTLY INCREASED TROPONIN VALUES IN THE RANGE OF 0.04-0.49 ng/mL CAN BE SEEN IN:       -UNSTABLE ANGINA       -CONGESTIVE HEART FAILURE       -MYOCARDITIS       -CHEST TRAUMA       -ARRYHTHMIAS       -LATE PRESENTING MYOCARDIAL INFARCTION       -COPD   CLINICAL FOLLOW-UP RECOMMENDED.   Magnesium     Status: Abnormal   Collection Time: 06/07/15 12:49 AM  Result Value Ref Range   Magnesium 3.1 (H) 1.7 - 2.4 mg/dL  Troponin I     Status: Abnormal   Collection Time: 06/07/15  5:03 AM  Result Value Ref Range   Troponin I 0.08 (H) <0.031 ng/mL    Comment:        PERSISTENTLY INCREASED TROPONIN VALUES IN THE RANGE OF 0.04-0.49 ng/mL CAN BE SEEN IN:       -UNSTABLE ANGINA       -CONGESTIVE HEART FAILURE       -MYOCARDITIS       -CHEST TRAUMA       -ARRYHTHMIAS       -LATE PRESENTING MYOCARDIAL INFARCTION       -COPD   CLINICAL FOLLOW-UP RECOMMENDED.     BMP Latest Ref Rng 06/07/2015 06/06/2015 06/05/2015  Glucose 65 - 99 mg/dL - 105(H) 107(H)  BUN 6 - 20 mg/dL - 28(H) 29(H)  Creatinine 0.44 - 1.00 mg/dL 1.40(H) 1.23(H) 0.99  Sodium 135 - 145 mmol/L - 141 140  Potassium 3.5 - 5.1 mmol/L - 4.2 4.0  Chloride 101 - 111 mmol/L - 100(L) 101  CO2 22 - 32 mmol/L - 31 29  Calcium 8.9 - 10.3 mg/dL - 9.7 9.1   Hepatic Function Latest Ref Rng 06/05/2015 05/21/2012  Total Protein 6.5 - 8.1 g/dL 7.6 7.5  Albumin 3.5 - 5.0 g/dL 3.7 3.7  AST 15 - 41 U/L 27 25  ALT 14 - 54 U/L 26 17  Alk Phosphatase 38 - 126 U/L  87 83  Total Bilirubin 0.3 - 1.2 mg/dL 0.8 0.6   CBC Latest Ref Rng 06/07/2015 06/06/2015 06/05/2015  WBC 4.0 - 10.5 K/uL 6.1 6.7 5.9  Hemoglobin 12.0 - 15.0 g/dL 11.6(L) 12.3 11.6(L)  Hematocrit 36.0 - 46.0 % 36.1 37.3 36.1  Platelets 150 - 400 K/uL 161 141(L) PLATELET CLUMPS NOTED ON SMEAR, COUNT APPEARS DECREASED   Lab Results  Component Value Date   MCV 96.3 06/07/2015   MCV 95.4 06/06/2015   MCV 96.8 06/05/2015   Lab Results  Component Value Date   TSH 1.60 03/07/2015   BNP (last 3 results)  Recent Labs  06/05/15 1500  BNP 758.9*    ProBNP (last 3 results)  Recent Labs  07/28/14 1142 08/13/14 1006 03/07/15 1240  PROBNP 508.0* 392.0* 584.0*   Troponin (Point of  Care Test)  Recent Labs  06/06/15 1922  TROPIPOC 0.12*    IMAGING: Dg Chest 2 View  06/06/2015   CLINICAL DATA:  Elevated troponin levels  EXAM: CHEST  2 VIEW  COMPARISON:  06/05/2015  FINDINGS: The heart is moderately enlarged. Normal vascularity. Hyperaeration. No pneumothorax or pleural effusion.  IMPRESSION: Cardiomegaly without decompensation.   Electronically Signed   By: Marybelle Killings M.D.   On: 06/06/2015 20:54   Dg Chest 2 View  06/05/2015   CLINICAL DATA:  Bilateral jaw pain this morning. History of hypertension and CHF.  EXAM: CHEST  2 VIEW  COMPARISON:  03/07/2015  FINDINGS: Cardiac silhouette remains enlarged, unchanged. Dense mitral annular calcifications and thoracic aortic calcification are again seen. Lungs remain hyperinflated. No confluent airspace opacity, pulmonary edema, pleural effusion, or pneumothorax is identified. The surgical clips overlie the right axilla and right breast. Lumbar levoscoliosis is partially visualized.  IMPRESSION: Hyperinflation and cardiomegaly without evidence active cardiopulmonary disease.   Electronically Signed   By: Logan Bores   On: 06/05/2015 15:21   ------------------------------------------------------------ ECHO: 03/04/2014 Study Conclusions  - Left  ventricle: The cavity size was normal. Wall thickness was increased in a pattern of mild LVH. Systolic function was normal. The estimated ejection fraction was in the range of 60% to 65%. - Aortic valve: AV is thickened, calcified with restricted motion. Peak and mean gradients through the valve are 45 and 28 mm Hg respectively consistent with moderate AS. Mild regurgitation. - Mitral valve: Mild regurgitation.   IMPRESSION:  1. Jaw/teeth discomfort with mildly positive flat, troponin curve;  agree that this may represent a mild anginal equivalent.  Doubt non-ST segment elevation MI.  ECG is normal without ischemic changes.  May also be contributed by her aortic stenosis and diastolic dysfunction.  2.  Aortic valve stenosis.  Echo Doppler study in April 2015 revealed a peak gradient of 45 mm and mean gradient of 28 mm.  Aortic valve area was 1. 43 cm.  A follow-up echo Doppler study is to be performed today for reassessment of progressive aortic valvular stenosis.  3.  Diastolic heart failure with mild left ventricular hypertrophy  4. Essential hypertension:  5.  Mild peripheral edema: Patient wears chronic support stockings which are placed daily at her retirement facility.  6.  History of paroxysmal atrial fibrillation.  Currently maintaining normal sinus rhythm on current therapy.  7.  Stage III chronic kidney disease: GFR 35  RECOMMENDATION:   Discussion was made with the patient concerning  if her echo Doppler study showed significant progressive aortic valve stenosis, would she be inclined to consider aggressive therapy.  She prefers medical therapy approach rather than any consideration for surgical treatment.  Would continue with current diabetic regimen, beta blocker, and optimal therapy for blood pressure control.  BP was elevated today; consider possible very low-dose amlodipine 2.5 mg if remains elevated for empiric therapy of possible CAD, and blood pressure  control without potential worsening of renal function.  However, this potentially may exacerbate lower extremity edema.  Would have patient follow-up with Dr. Fransico Him following her hospitalization   Troy Sine, MD, [facility] [place] 06/07/2015 10:45 AM

## 2015-06-12 NOTE — Progress Notes (Signed)
Cardiology Office Note   Date:  06/13/2015   ID:  Kathy Hoover, DOB [birth year], MRN [id number]  PCP:  Gavin Pound, MD    Chief Complaint  Patient presents with  . Follow-up    chronic diastloic heart failure      History of Present Illness: Kathy Hoover is a 81 y.o. female with a history of chronic diastolic CHF, HTN and chronic LE edema who presents today for followup. She was recently seen in the hospital for an episode of jaw and tooth pain with mildly elevated troponin with flat curve.  EKG showed no ST changes.  2D echo with normal LVF and mild AS.  She denies any chest pain, dizziness, LE edema, palpitations or syncope. She has chronic SOB that is stable for the most part but does increase with ambulation.  She did not want aggressive invasive procedures and opted for medical therapy given her age.       Past Medical History  Diagnosis Date  . Lower extremity edema     CHRONIC  . Hypothyroidism   . Anxiety and depression   . Hypertension   . Moderate aortic stenosis 05/2012    by ECHO  . Cervical disc disease     W radiculopathy S/P epidural injections  . Vitamin D deficiency   . Macular degeneration     Dr Hillis Range Follows for pt.  . Anxiety   . Depression   . Exertional shortness of breath     "worse recently" (01/13/2014)  . Pneumonia     "once before today" (01/13/2014)  . Bacterial pneumonia 01/13/2014  . Chronic bronchitis     "@ least once a year;  in the winter" (01/13/2014)  . Breast cancer   . Arthritis     "terribly in my hands" (01/13/2014)  . Chronic diastolic CHF (congestive heart failure)     Preserved EF    Past Surgical History  Procedure Laterality Date  . Cataract extraction w/ intraocular lens  implant, bilateral Bilateral ~ 2008  . Tonsillectomy  1949  . Abdominal hysterectomy    . Appendectomy  1949  . Breast biopsy Right ~ 2005  . Breast lumpectomy Right ~ 2005     Current Outpatient Prescriptions  Medication Sig  Dispense Refill  . acetaminophen (TYLENOL) 325 MG tablet Take 2 tablets (650 mg total) by mouth every 6 (six) hours as needed (pain or Fever >/= 101).    Marland Kitchen albuterol (PROVENTIL HFA;VENTOLIN HFA) 108 (90 BASE) MCG/ACT inhaler Inhale 1-2 puffs into the lungs every 6 (six) hours as needed for wheezing or shortness of breath. 1 Inhaler 0  . amLODipine (NORVASC) 2.5 MG tablet Take 1 tablet (2.5 mg total) by mouth daily. 30 tablet 0  . aspirin EC 81 MG EC tablet Take 1 tablet (81 mg total) by mouth daily. 30 tablet 0  . Cholecalciferol 50000 UNITS capsule Take 50,000 Units by mouth every Wednesday.    . furosemide (LASIX) 40 MG tablet Take 1 tablet (40 mg total) by mouth daily. 30 tablet 6  . levothyroxine (SYNTHROID, LEVOTHROID) 75 MCG tablet Take 75 mcg by mouth daily before breakfast.    . Multiple Vitamin (MULTIVITAMIN WITH MINERALS) TABS Take 1 tablet by mouth daily.    . pindolol (VISKEN) 5 MG tablet Take 1 tablet (5 mg total) by mouth daily. 30 tablet 0  . Polyethyl Glycol-Propyl Glycol (SYSTANE OP) Apply  1-2 drops to eye 4 (four) times daily as needed (dry eyes).    . potassium chloride (K-DUR) 10 MEQ tablet Take 10 mEq by mouth daily.    . vitamin C (ASCORBIC ACID) 500 MG tablet Take 500 mg by mouth daily.     No current facility-administered medications for this visit.    Allergies:   Other; Codeine; and Penicillins    Social History:  The patient  reports that she has never smoked. She has never used smokeless tobacco. She reports that she does not drink alcohol or use illicit drugs.   Family History:  The patient's family history includes CVA in her brother and father; Multiple sclerosis in her brother; Pneumonia in her father.    ROS:  Please see the history of present illness.   Otherwise, review of systems are positive for none.   All other systems are reviewed and negative.    PHYSICAL EXAM: VS:  BP 140/60 mmHg  Pulse 68  Ht 4\' 11"  (1.499 m)  Wt 170 lb 12.8 oz (77.474 kg)   BMI 34.48 kg/m2  SpO2 94% , BMI Body mass index is 34.48 kg/(m^2). GEN: Well nourished, well developed, in no acute distress HEENT: normal Neck: no JVD, carotid bruits, or masses Cardiac: RRR; no murmurs, rubs, or gallops,no edema  Respiratory:  clear to auscultation bilaterally, normal work of breathing GI: soft, nontender, nondistended, + BS MS: no deformity or atrophy Skin: warm and dry, no rash Neuro:  Strength and sensation are intact Psych: euthymic mood, full affect   EKG:  EKG is not ordered today.    Recent Labs: 03/07/2015: Pro B Natriuretic peptide (BNP) 584.0*; TSH 1.60 06/05/2015: ALT 26; B Natriuretic Peptide 758.9* 06/06/2015: BUN 28*; Potassium 4.2; Sodium 141 06/07/2015: Creatinine, Ser 1.40*; Hemoglobin 11.6*; Magnesium 3.1*; Platelets 161    Lipid Panel No results found for: CHOL, TRIG, HDL, CHOLHDL, VLDL, LDLCALC, LDLDIRECT    Wt Readings from Last 3 Encounters:  06/13/15 170 lb 12.8 oz (77.474 kg)  06/07/15 169 lb 8.5 oz (76.9 kg)  06/05/15 165 lb (74.844 kg)     ASSESSMENT AND PLAN:  1. Chronic diastolic CHF - appears euvolemic - continue Lasix - restart Pindolol - she thought the amlodipine replaced this 2. HTN - borderline controlled - restart Pindolol  3. Chronic LE edema - resolved - continue Lasix and TED hose stockings as needed   4. Chronic SOB resolved  5. Mild AS and moderate AI by recent echo with normal LVF       6.  Recent jaw pain ? Anginal equivalent with flat mild troponin elevation.  Patient requested medical therapy and no invasive procedures.  She has not had any further jaw pain or CP.  Continue amlodipine/ASA and restart BB.  I will get a prescription for SL NTG.     Current medicines are reviewed at length with the patient today.  The patient does not have concerns regarding medicines.  The following changes have been made:  no change  Labs/ tests ordered today: See above Assessment and Plan No orders of the  defined types were placed in this encounter.     Disposition:   FU with me in 6 months  Signed, Sueanne Margarita, MD  06/13/2015 4:51 PM    [facility] [address], [place], [place]  [address] Phone: [phone]; Fax: [phone]

## 2015-06-13 ENCOUNTER — Ambulatory Visit (INDEPENDENT_AMBULATORY_CARE_PROVIDER_SITE_OTHER): Payer: Medicare Other | Admitting: Cardiology

## 2015-06-13 ENCOUNTER — Encounter: Payer: Self-pay | Admitting: Cardiology

## 2015-06-13 VITALS — BP 140/60 | HR 68 | Ht 59.0 in | Wt 170.8 lb

## 2015-06-13 DIAGNOSIS — I10 Essential (primary) hypertension: Secondary | ICD-10-CM | POA: Diagnosis not present

## 2015-06-13 DIAGNOSIS — I5032 Chronic diastolic (congestive) heart failure: Secondary | ICD-10-CM

## 2015-06-13 DIAGNOSIS — I350 Nonrheumatic aortic (valve) stenosis: Secondary | ICD-10-CM | POA: Diagnosis not present

## 2015-06-13 DIAGNOSIS — I214 Non-ST elevation (NSTEMI) myocardial infarction: Secondary | ICD-10-CM | POA: Diagnosis not present

## 2015-06-13 MED ORDER — PINDOLOL 5 MG PO TABS: 5.0000 mg | ORAL_TABLET | Freq: Every day | ORAL | Status: DC

## 2015-06-13 MED ORDER — NITROGLYCERIN 0.4 MG SL SUBL: 0.4000 mg | SUBLINGUAL_TABLET | SUBLINGUAL | Status: AC | PRN

## 2015-06-13 NOTE — Patient Instructions (Signed)
Medication Instructions:  Your physician has recommended you make the following change in your medication:  1) START SUBLINGUAL NITRO AS NEEDED for chest pain 2) START PINDOLOL 5 mg daily  Labwork: None  Testing/Procedures: None  Follow-Up: Your physician recommends that you schedule a follow-up appointment in 3 months with Dr. Radford Pax.  Any Other Special Instructions Will Be Listed Below (If Applicable).

## 2015-06-21 DIAGNOSIS — M1611 Unilateral primary osteoarthritis, right hip: Secondary | ICD-10-CM | POA: Diagnosis not present

## 2015-06-22 ENCOUNTER — Ambulatory Visit (INDEPENDENT_AMBULATORY_CARE_PROVIDER_SITE_OTHER): Payer: Medicare Other | Admitting: Podiatry

## 2015-06-22 ENCOUNTER — Encounter: Payer: Self-pay | Admitting: Podiatry

## 2015-06-22 ENCOUNTER — Ambulatory Visit: Payer: Medicare Other | Admitting: Podiatry

## 2015-06-22 VITALS — BP 134/58 | HR 74

## 2015-06-22 DIAGNOSIS — M79673 Pain in unspecified foot: Secondary | ICD-10-CM

## 2015-06-22 DIAGNOSIS — M79606 Pain in leg, unspecified: Secondary | ICD-10-CM

## 2015-06-22 DIAGNOSIS — B351 Tinea unguium: Secondary | ICD-10-CM | POA: Diagnosis not present

## 2015-06-22 DIAGNOSIS — L600 Ingrowing nail: Secondary | ICD-10-CM

## 2015-06-22 NOTE — Patient Instructions (Signed)
Seen for hypertrophic nails. All nails debrided. Return in 3 months or as needed.  

## 2015-06-22 NOTE — Progress Notes (Signed)
SUBJECTIVE: 81 y.o. year old female presents via wheeled walker accompanied by her daughter-in-law, complaining of painful toes from ingrown nails and requests nails trimmed. Patient wears compression stockings for ankle edema. Having hard of hearing.   OBJECTIVE: DERMATOLOGIC EXAMINATION: Painful ingrown nails both great toes without open infection or drainage. Hypertrophic nails x 10. VASCULAR EXAMINATION OF LOWER LIMBS: Pedal pulses: All pedal pulses are palpable with normal pulsation.  Minimum edema noted. Temperature gradient from tibial crest to dorsum of foot is within normal bilateral. NEUROLOGIC EXAMINATION OF THE LOWER LIMBS: All epicritic and tactile sensations grossly intact. MUSCULOSKELETAL EXAMINATION: Mild digital contracture lesser digits bilateral.  ASSESSMENT: Onychomycosis x 10. Ingrown nail both great toes. Pain in lower limb.  Plan: All nails debrided. Pain was relieved. Return in 3 months.

## 2015-06-27 ENCOUNTER — Telehealth: Payer: Self-pay | Admitting: Cardiology

## 2015-06-27 NOTE — Telephone Encounter (Signed)
Spoke with pt who is c/o fatigue and sleepiness since being on Pindolol and amlodipine.  Reports her last BP was 116/59 and "that is too low."  Advised pt that is a good blood pressure reading and unless she is having other s/s she should continue on the same medication.  Pt is also reporting having a bruise on one of her arms that she feels is from the baby asa she was started on.  She wants to make sure Dr Radford Pax is aware that she doesn't want any more of "these purple botches" and does not want to continue on Asa.  She states she was instructed to come into the office on Thursday for lab to recheck for Vit D level.  I have been unable to find any documentation about this and there are no orders in the system for pt to come in for labs.   I asked the pt if her PCP MD requested this lab and she is addiment it was Dr Radford Pax.  Aware I will forward this information to MD for review and orders.

## 2015-06-27 NOTE — Telephone Encounter (Signed)
New Message   Pt has a big purple mark between the wrist and elbow,   Pt had it for a couple of days and she is on Asprin and she wants a RN to call her.  She states that it do not hurt.  Please call pt she said at 2:00pm

## 2015-06-28 MED ORDER — ASPIRIN 81 MG PO TBEC: 81.0000 mg | DELAYED_RELEASE_TABLET | ORAL | Status: AC

## 2015-06-28 NOTE — Telephone Encounter (Signed)
Spoke with pt and informed her to take ASA QOD and to stop Pindolol and check BP QD x 1 wk and call with results. Informed pt that Dr. Radford Pax did not order Vit D level.  Pt had paperwork from Dr. Baldomero Lamy office to repeat Vit D. Advised pt to call their office to find out where to go to have blood work drawn. Pt verbalized understanding and was in agreement with med changes and to call with BP readings in one week.

## 2015-06-28 NOTE — Telephone Encounter (Signed)
Left message to call back  

## 2015-06-28 NOTE — Telephone Encounter (Signed)
She was started on ASA for possible CAD.  Change ASA to every other day.  Stop Pindolol and check BP daily for a week and call with results.  I did not order VItamin D level

## 2015-06-30 DIAGNOSIS — Z79899 Other long term (current) drug therapy: Secondary | ICD-10-CM | POA: Diagnosis not present

## 2015-06-30 DIAGNOSIS — F329 Major depressive disorder, single episode, unspecified: Secondary | ICD-10-CM | POA: Diagnosis not present

## 2015-06-30 DIAGNOSIS — I5030 Unspecified diastolic (congestive) heart failure: Secondary | ICD-10-CM | POA: Diagnosis not present

## 2015-06-30 DIAGNOSIS — I10 Essential (primary) hypertension: Secondary | ICD-10-CM | POA: Diagnosis not present

## 2015-06-30 DIAGNOSIS — E039 Hypothyroidism, unspecified: Secondary | ICD-10-CM | POA: Diagnosis not present

## 2015-06-30 DIAGNOSIS — E559 Vitamin D deficiency, unspecified: Secondary | ICD-10-CM | POA: Diagnosis not present

## 2015-07-06 ENCOUNTER — Other Ambulatory Visit: Payer: Self-pay | Admitting: *Deleted

## 2015-07-06 MED ORDER — AMLODIPINE BESYLATE 2.5 MG PO TABS: 2.5000 mg | ORAL_TABLET | Freq: Every day | ORAL | Status: DC

## 2015-07-22 NOTE — Telephone Encounter (Signed)
error 

## 2015-09-13 DIAGNOSIS — L821 Other seborrheic keratosis: Secondary | ICD-10-CM | POA: Diagnosis not present

## 2015-09-13 DIAGNOSIS — X32XXXD Exposure to sunlight, subsequent encounter: Secondary | ICD-10-CM | POA: Diagnosis not present

## 2015-09-13 DIAGNOSIS — L570 Actinic keratosis: Secondary | ICD-10-CM | POA: Diagnosis not present

## 2015-09-13 DIAGNOSIS — L820 Inflamed seborrheic keratosis: Secondary | ICD-10-CM | POA: Diagnosis not present

## 2015-09-15 ENCOUNTER — Other Ambulatory Visit: Payer: Self-pay | Admitting: Cardiology

## 2015-09-15 MED ORDER — AMLODIPINE BESYLATE 2.5 MG PO TABS: 2.5000 mg | ORAL_TABLET | Freq: Every day | ORAL | Status: DC

## 2015-09-20 ENCOUNTER — Other Ambulatory Visit: Payer: Self-pay | Admitting: *Deleted

## 2015-09-20 NOTE — Progress Notes (Signed)
Cardiology Office Note   Date:  09/21/2015   ID:  Kathy Hoover, DOB 24-Jan-1916, MRN [id number]  PCP:  Gavin Pound, MD    Chief Complaint  Patient presents with  . Congestive Heart Failure  . Hypertension      History of Present Illness: Kathy Hoover is a 81 y.o. female with a history of chronic diastolic CHF, HTN, mild AS and chronic LE edema who presents today for followup. She denies any chest pain, dizziness, SOB, DOE, palpitations or syncope.  She did not want aggressive invasive procedures and opted for medical therapy given her age.    Past Medical History  Diagnosis Date  . Lower extremity edema     CHRONIC  . Hypothyroidism   . Anxiety and depression   . Hypertension   . Moderate aortic stenosis 05/2012    by ECHO  . Cervical disc disease     W radiculopathy S/P epidural injections  . Vitamin D deficiency   . Macular degeneration     Dr Hillis Range Follows for pt.  . Anxiety   . Depression   . Exertional shortness of breath     "worse recently" (01/13/2014)  . Pneumonia     "once before today" (01/13/2014)  . Bacterial pneumonia 01/13/2014  . Chronic bronchitis ([place])     "@ least once a year;  in the winter" (01/13/2014)  . Breast cancer ([place])   . Arthritis     "terribly in my hands" (01/13/2014)  . Chronic diastolic CHF (congestive heart failure) (HCC)     Preserved EF    Past Surgical History  Procedure Laterality Date  . Cataract extraction w/ intraocular lens  implant, bilateral Bilateral ~ 2008  . Tonsillectomy  1949  . Abdominal hysterectomy    . Appendectomy  1949  . Breast biopsy Right ~ 2005  . Breast lumpectomy Right ~ 2005     Current Outpatient Prescriptions  Medication Sig Dispense Refill  . acetaminophen (TYLENOL) 325 MG tablet Take 2 tablets (650 mg total) by mouth every 6 (six) hours as needed (pain or Fever >/= 101).    Marland Kitchen albuterol (PROVENTIL HFA;VENTOLIN HFA) 108 (90 BASE) MCG/ACT inhaler Inhale 1-2 puffs into the  lungs every 6 (six) hours as needed for wheezing or shortness of breath. 1 Inhaler 0  . amLODipine (NORVASC) 2.5 MG tablet Take 1 tablet (2.5 mg total) by mouth daily. 90 tablet 2  . aspirin 81 MG EC tablet Take 1 tablet (81 mg total) by mouth every other day. 30 tablet 0  . Cholecalciferol 50000 UNITS capsule Take 50,000 Units by mouth every Wednesday.    . furosemide (LASIX) 40 MG tablet Take 1 tablet (40 mg total) by mouth daily. 30 tablet 6  . levothyroxine (SYNTHROID, LEVOTHROID) 75 MCG tablet Take 75 mcg by mouth daily before breakfast.    . Multiple Vitamin (MULTIVITAMIN WITH MINERALS) TABS Take 1 tablet by mouth daily.    . nitroGLYCERIN (NITROSTAT) 0.4 MG SL tablet Place 1 tablet (0.4 mg total) under the tongue every 5 (five) minutes as needed for chest pain. 25 tablet 1  . Polyethyl Glycol-Propyl Glycol (SYSTANE OP) Apply 1-2 drops to eye 4 (four) times daily as needed (dry eyes).    . potassium chloride (K-DUR) 10 MEQ tablet Take 10 mEq by mouth daily.    . vitamin C (ASCORBIC ACID) 500 MG tablet Take 500 mg by  mouth daily.     No current facility-administered medications for this visit.    Allergies:   Other; Codeine; and Penicillins    Social History:  The patient  reports that she has never smoked. She has never used smokeless tobacco. She reports that she does not drink alcohol or use illicit drugs.   Family History:  The patient's family history includes CVA in her brother and father; Multiple sclerosis in her brother; Pneumonia in her father.    ROS:  Please see the history of present illness.   Otherwise, review of systems are positive for none.   All other systems are reviewed and negative.    PHYSICAL EXAM: VS:  BP 128/68 mmHg  Pulse 64  Ht 4\' 11"  (1.499 m)  Wt 175 lb 12.8 oz (79.742 kg)  BMI 35.49 kg/m2 , BMI Body mass index is 35.49 kg/(m^2). GEN: Well nourished, well developed, in no acute distress HEENT: normal Neck: no JVD, carotid bruits, or  masses Cardiac: RRR; no  rubs, or gallops.  1+ edema.  2/6 SM at RUSB to LLSB Respiratory:  clear to auscultation bilaterally, normal work of breathing GI: soft, nontender, nondistended, + BS MS: no deformity or atrophy Skin: warm and dry, no rash Neuro:  Strength and sensation are intact Psych: euthymic mood, full affect   EKG:  EKG is not ordered today.    Recent Labs: 03/07/2015: Pro B Natriuretic peptide (BNP) 584.0*; TSH 1.60 06/05/2015: ALT 26; B Natriuretic Peptide 758.9* 06/06/2015: BUN 28*; Potassium 4.2; Sodium 141 06/07/2015: Creatinine, Ser 1.40*; Hemoglobin 11.6*; Magnesium 3.1*; Platelets 161    Lipid Panel No results found for: CHOL, TRIG, HDL, CHOLHDL, VLDL, LDLCALC, LDLDIRECT    Wt Readings from Last 3 Encounters:  09/21/15 175 lb 12.8 oz (79.742 kg)  06/13/15 170 lb 12.8 oz (77.474 kg)  06/07/15 169 lb 8.5 oz (76.9 kg)      ASSESSMENT AND PLAN:  1. Chronic diastolic CHF - appears euvolemic - continue Lasix 2. HTN -  controlled - continue amlodipine 3. Chronic LE edema - 1+ edema today.   - increase Lasix to 40mg  BID for 2 days then 40mg  daily.and TED hose stockings daily  4. Chronic SOB resolved  5. Mild AS and moderate AI by  echo with normal LVF  Current medicines are reviewed at length with the patient today.  The patient does not have concerns regarding medicines.  The following changes have been made:  no change  Labs/ tests ordered today: See above Assessment and Plan No orders of the defined types were placed in this encounter.     Disposition:   FU with me in 6 months  Signed, Sueanne Margarita, MD  09/21/2015 8:17 AM    [facility] [address], [place], [place]  [address] Phone: [phone]; Fax: [phone]

## 2015-09-21 ENCOUNTER — Encounter: Payer: Self-pay | Admitting: Cardiology

## 2015-09-21 ENCOUNTER — Ambulatory Visit (INDEPENDENT_AMBULATORY_CARE_PROVIDER_SITE_OTHER): Payer: Medicare Other | Admitting: Cardiology

## 2015-09-21 ENCOUNTER — Telehealth: Payer: Self-pay

## 2015-09-21 VITALS — BP 128/68 | HR 64 | Ht 59.0 in | Wt 175.8 lb

## 2015-09-21 DIAGNOSIS — R0602 Shortness of breath: Secondary | ICD-10-CM

## 2015-09-21 DIAGNOSIS — I5032 Chronic diastolic (congestive) heart failure: Secondary | ICD-10-CM

## 2015-09-21 DIAGNOSIS — I10 Essential (primary) hypertension: Secondary | ICD-10-CM | POA: Diagnosis not present

## 2015-09-21 DIAGNOSIS — I350 Nonrheumatic aortic (valve) stenosis: Secondary | ICD-10-CM

## 2015-09-21 NOTE — Telephone Encounter (Signed)
OK with me.

## 2015-09-21 NOTE — Telephone Encounter (Signed)
Patient requests to change MDs from Dr. Radford Pax to Dr. Stanford Breed so she can see him in [place] instead of relying on rides to [facility].  To Dr. Radford Pax and Dr. Stanford Breed.

## 2015-09-21 NOTE — Telephone Encounter (Signed)
Ok with me Kathy Hoover  

## 2015-09-21 NOTE — Patient Instructions (Signed)
Medication Instructions:  Your physician has recommended you make the following change in your medication:  1) INCREASE LASIX to 40 mg TWICE DAILY for 2 days then resume 40 mg daily.  Labwork: None  Testing/Procedures: None  Follow-Up: Your physician wants you to follow-up in: 6 months with Dr. Radford Pax. You will receive a reminder letter in the mail two months in advance. If you don't receive a letter, please call our office to schedule the follow-up appointment.   Any Other Special Instructions Will Be Listed Below (If Applicable).     If you need a refill on your cardiac medications before your next appointment, please call your pharmacy.

## 2015-09-22 ENCOUNTER — Ambulatory Visit (INDEPENDENT_AMBULATORY_CARE_PROVIDER_SITE_OTHER): Payer: Medicare Other | Admitting: Podiatry

## 2015-09-22 ENCOUNTER — Encounter: Payer: Self-pay | Admitting: Podiatry

## 2015-09-22 DIAGNOSIS — L600 Ingrowing nail: Secondary | ICD-10-CM | POA: Diagnosis not present

## 2015-09-22 DIAGNOSIS — M79606 Pain in leg, unspecified: Secondary | ICD-10-CM | POA: Diagnosis not present

## 2015-09-22 DIAGNOSIS — B351 Tinea unguium: Secondary | ICD-10-CM | POA: Diagnosis not present

## 2015-09-22 NOTE — Progress Notes (Signed)
SUBJECTIVE: 81 y.o. year old female presents requesting painful toe nails trimmed.  Patient wears compression stockings for ankle edema. Having hard of hearing.   OBJECTIVE: DERMATOLOGIC EXAMINATION: Painful ingrown nails both great toes without open infection or drainage. Hypertrophic nails x 10. VASCULAR EXAMINATION OF LOWER LIMBS: Pedal pulses: All pedal pulses are palpable with normal pulsation.  Minimum edema noted. Temperature gradient from tibial crest to dorsum of foot is within normal bilateral. NEUROLOGIC EXAMINATION OF THE LOWER LIMBS: All epicritic and tactile sensations grossly intact. MUSCULOSKELETAL EXAMINATION: Mild digital contracture lesser digits bilateral.  ASSESSMENT: Onychomycosis x 10. Ingrown nail both great toes. Pain in lower limb.  Plan: All nails debrided. Pain was relieved. Return in 3 months.

## 2015-09-22 NOTE — Patient Instructions (Signed)
Seen for hypertrophic nails. All nails debrided. Return in 3 months or as needed.  

## 2015-09-23 ENCOUNTER — Ambulatory Visit: Payer: Medicare Other | Admitting: Podiatry

## 2015-10-13 DIAGNOSIS — F329 Major depressive disorder, single episode, unspecified: Secondary | ICD-10-CM | POA: Diagnosis not present

## 2015-10-13 DIAGNOSIS — I5030 Unspecified diastolic (congestive) heart failure: Secondary | ICD-10-CM | POA: Diagnosis not present

## 2015-10-13 DIAGNOSIS — E039 Hypothyroidism, unspecified: Secondary | ICD-10-CM | POA: Diagnosis not present

## 2015-10-13 DIAGNOSIS — Z79899 Other long term (current) drug therapy: Secondary | ICD-10-CM | POA: Diagnosis not present

## 2015-10-13 DIAGNOSIS — E559 Vitamin D deficiency, unspecified: Secondary | ICD-10-CM | POA: Diagnosis not present

## 2015-10-13 DIAGNOSIS — I10 Essential (primary) hypertension: Secondary | ICD-10-CM | POA: Diagnosis not present

## 2015-10-20 DIAGNOSIS — E559 Vitamin D deficiency, unspecified: Secondary | ICD-10-CM | POA: Diagnosis not present

## 2015-10-20 DIAGNOSIS — E039 Hypothyroidism, unspecified: Secondary | ICD-10-CM | POA: Diagnosis not present

## 2015-10-20 DIAGNOSIS — Z23 Encounter for immunization: Secondary | ICD-10-CM | POA: Diagnosis not present

## 2015-10-20 DIAGNOSIS — I10 Essential (primary) hypertension: Secondary | ICD-10-CM | POA: Diagnosis not present

## 2015-10-20 DIAGNOSIS — R0902 Hypoxemia: Secondary | ICD-10-CM | POA: Diagnosis not present

## 2015-10-20 DIAGNOSIS — I5030 Unspecified diastolic (congestive) heart failure: Secondary | ICD-10-CM | POA: Diagnosis not present

## 2015-10-27 DIAGNOSIS — M79642 Pain in left hand: Secondary | ICD-10-CM | POA: Diagnosis not present

## 2015-10-27 DIAGNOSIS — M79641 Pain in right hand: Secondary | ICD-10-CM | POA: Diagnosis not present

## 2015-10-27 DIAGNOSIS — M6281 Muscle weakness (generalized): Secondary | ICD-10-CM | POA: Diagnosis not present

## 2015-10-27 DIAGNOSIS — M25511 Pain in right shoulder: Secondary | ICD-10-CM | POA: Diagnosis not present

## 2015-10-27 DIAGNOSIS — G5601 Carpal tunnel syndrome, right upper limb: Secondary | ICD-10-CM | POA: Diagnosis not present

## 2015-10-31 DIAGNOSIS — M79642 Pain in left hand: Secondary | ICD-10-CM | POA: Diagnosis not present

## 2015-10-31 DIAGNOSIS — M79641 Pain in right hand: Secondary | ICD-10-CM | POA: Diagnosis not present

## 2015-10-31 DIAGNOSIS — M6281 Muscle weakness (generalized): Secondary | ICD-10-CM | POA: Diagnosis not present

## 2015-10-31 DIAGNOSIS — G5601 Carpal tunnel syndrome, right upper limb: Secondary | ICD-10-CM | POA: Diagnosis not present

## 2015-10-31 DIAGNOSIS — M25511 Pain in right shoulder: Secondary | ICD-10-CM | POA: Diagnosis not present

## 2015-11-01 DIAGNOSIS — M79642 Pain in left hand: Secondary | ICD-10-CM | POA: Diagnosis not present

## 2015-11-01 DIAGNOSIS — M6281 Muscle weakness (generalized): Secondary | ICD-10-CM | POA: Diagnosis not present

## 2015-11-01 DIAGNOSIS — M25511 Pain in right shoulder: Secondary | ICD-10-CM | POA: Diagnosis not present

## 2015-11-01 DIAGNOSIS — M79641 Pain in right hand: Secondary | ICD-10-CM | POA: Diagnosis not present

## 2015-11-01 DIAGNOSIS — M1711 Unilateral primary osteoarthritis, right knee: Secondary | ICD-10-CM | POA: Diagnosis not present

## 2015-11-01 DIAGNOSIS — G5601 Carpal tunnel syndrome, right upper limb: Secondary | ICD-10-CM | POA: Diagnosis not present

## 2015-11-01 DIAGNOSIS — M25561 Pain in right knee: Secondary | ICD-10-CM | POA: Diagnosis not present

## 2015-11-15 DIAGNOSIS — G5601 Carpal tunnel syndrome, right upper limb: Secondary | ICD-10-CM | POA: Diagnosis not present

## 2015-11-15 DIAGNOSIS — M6281 Muscle weakness (generalized): Secondary | ICD-10-CM | POA: Diagnosis not present

## 2015-11-15 DIAGNOSIS — M25511 Pain in right shoulder: Secondary | ICD-10-CM | POA: Diagnosis not present

## 2015-11-15 DIAGNOSIS — M79642 Pain in left hand: Secondary | ICD-10-CM | POA: Diagnosis not present

## 2015-11-15 DIAGNOSIS — M79641 Pain in right hand: Secondary | ICD-10-CM | POA: Diagnosis not present

## 2015-11-17 DIAGNOSIS — M79642 Pain in left hand: Secondary | ICD-10-CM | POA: Diagnosis not present

## 2015-11-17 DIAGNOSIS — G5601 Carpal tunnel syndrome, right upper limb: Secondary | ICD-10-CM | POA: Diagnosis not present

## 2015-11-17 DIAGNOSIS — M25511 Pain in right shoulder: Secondary | ICD-10-CM | POA: Diagnosis not present

## 2015-11-17 DIAGNOSIS — M6281 Muscle weakness (generalized): Secondary | ICD-10-CM | POA: Diagnosis not present

## 2015-11-17 DIAGNOSIS — M79641 Pain in right hand: Secondary | ICD-10-CM | POA: Diagnosis not present

## 2015-11-23 DIAGNOSIS — K5792 Diverticulitis of intestine, part unspecified, without perforation or abscess without bleeding: Secondary | ICD-10-CM | POA: Diagnosis not present

## 2015-11-24 DIAGNOSIS — M79641 Pain in right hand: Secondary | ICD-10-CM | POA: Diagnosis not present

## 2015-11-24 DIAGNOSIS — M79642 Pain in left hand: Secondary | ICD-10-CM | POA: Diagnosis not present

## 2015-11-24 DIAGNOSIS — M25511 Pain in right shoulder: Secondary | ICD-10-CM | POA: Diagnosis not present

## 2015-11-24 DIAGNOSIS — G5601 Carpal tunnel syndrome, right upper limb: Secondary | ICD-10-CM | POA: Diagnosis not present

## 2015-11-24 DIAGNOSIS — M6281 Muscle weakness (generalized): Secondary | ICD-10-CM | POA: Diagnosis not present

## 2015-11-28 DIAGNOSIS — R05 Cough: Secondary | ICD-10-CM | POA: Diagnosis not present

## 2015-11-29 DIAGNOSIS — M79642 Pain in left hand: Secondary | ICD-10-CM | POA: Diagnosis not present

## 2015-11-29 DIAGNOSIS — G5601 Carpal tunnel syndrome, right upper limb: Secondary | ICD-10-CM | POA: Diagnosis not present

## 2015-11-29 DIAGNOSIS — M79641 Pain in right hand: Secondary | ICD-10-CM | POA: Diagnosis not present

## 2015-11-29 DIAGNOSIS — M25511 Pain in right shoulder: Secondary | ICD-10-CM | POA: Diagnosis not present

## 2015-11-29 DIAGNOSIS — M6281 Muscle weakness (generalized): Secondary | ICD-10-CM | POA: Diagnosis not present

## 2015-12-03 ENCOUNTER — Emergency Department (HOSPITAL_COMMUNITY): Payer: Medicare Other

## 2015-12-03 ENCOUNTER — Encounter (HOSPITAL_COMMUNITY): Payer: Self-pay | Admitting: Emergency Medicine

## 2015-12-03 ENCOUNTER — Emergency Department (HOSPITAL_COMMUNITY)
Admission: EM | Admit: 2015-12-03 | Discharge: 2015-12-03 | Disposition: A | Discharge status: Home / Self Care | Payer: Medicare Other | Attending: Emergency Medicine | Admitting: Emergency Medicine

## 2015-12-03 DIAGNOSIS — J069 Acute upper respiratory infection, unspecified: Secondary | ICD-10-CM

## 2015-12-03 DIAGNOSIS — R05 Cough: Secondary | ICD-10-CM

## 2015-12-03 DIAGNOSIS — Z8701 Personal history of pneumonia (recurrent): Secondary | ICD-10-CM | POA: Diagnosis not present

## 2015-12-03 DIAGNOSIS — Z79899 Other long term (current) drug therapy: Secondary | ICD-10-CM | POA: Insufficient documentation

## 2015-12-03 DIAGNOSIS — F419 Anxiety disorder, unspecified: Secondary | ICD-10-CM | POA: Diagnosis not present

## 2015-12-03 DIAGNOSIS — Z853 Personal history of malignant neoplasm of breast: Secondary | ICD-10-CM | POA: Insufficient documentation

## 2015-12-03 DIAGNOSIS — F329 Major depressive disorder, single episode, unspecified: Secondary | ICD-10-CM | POA: Insufficient documentation

## 2015-12-03 DIAGNOSIS — R1032 Left lower quadrant pain: Secondary | ICD-10-CM | POA: Diagnosis not present

## 2015-12-03 DIAGNOSIS — Z880 Allergy status to penicillin: Secondary | ICD-10-CM | POA: Diagnosis not present

## 2015-12-03 DIAGNOSIS — E039 Hypothyroidism, unspecified: Secondary | ICD-10-CM | POA: Diagnosis not present

## 2015-12-03 DIAGNOSIS — I10 Essential (primary) hypertension: Secondary | ICD-10-CM | POA: Diagnosis not present

## 2015-12-03 DIAGNOSIS — R0602 Shortness of breath: Secondary | ICD-10-CM | POA: Diagnosis not present

## 2015-12-03 DIAGNOSIS — K5792 Diverticulitis of intestine, part unspecified, without perforation or abscess without bleeding: Secondary | ICD-10-CM

## 2015-12-03 DIAGNOSIS — K5730 Diverticulosis of large intestine without perforation or abscess without bleeding: Secondary | ICD-10-CM | POA: Diagnosis not present

## 2015-12-03 DIAGNOSIS — R059 Cough, unspecified: Secondary | ICD-10-CM

## 2015-12-03 DIAGNOSIS — I5032 Chronic diastolic (congestive) heart failure: Secondary | ICD-10-CM | POA: Diagnosis not present

## 2015-12-03 DIAGNOSIS — Z7982 Long term (current) use of aspirin: Secondary | ICD-10-CM | POA: Diagnosis not present

## 2015-12-03 LAB — BASIC METABOLIC PANEL
Anion gap: 13 (ref 5–15)
BUN: 19 mg/dL (ref 6–20)
CHLORIDE: 93 mmol/L — AB (ref 101–111)
CO2: 26 mmol/L (ref 22–32)
Calcium: 9 mg/dL (ref 8.9–10.3)
Creatinine, Ser: 1.2 mg/dL — ABNORMAL HIGH (ref 0.44–1.00)
GFR calc Af Amer: 42 mL/min — ABNORMAL LOW (ref >60)
GFR calc non Af Amer: 36 mL/min — ABNORMAL LOW (ref >60)
GLUCOSE: 101 mg/dL — AB (ref 65–99)
POTASSIUM: 3.8 mmol/L (ref 3.5–5.1)
SODIUM: 132 mmol/L — AB (ref 135–145)

## 2015-12-03 LAB — I-STAT CG4 LACTIC ACID, ED: LACTIC ACID, VENOUS: 1.94 mmol/L (ref 0.5–2.0)

## 2015-12-03 LAB — CBC
HEMATOCRIT: 33.5 % — AB (ref 36.0–46.0)
Hemoglobin: 11.3 g/dL — ABNORMAL LOW (ref 12.0–15.0)
MCH: 30.8 pg (ref 26.0–34.0)
MCHC: 33.7 g/dL (ref 30.0–36.0)
MCV: 91.3 fL (ref 78.0–100.0)
RBC: 3.67 MIL/uL — ABNORMAL LOW (ref 3.87–5.11)
RDW: 14.5 % (ref 11.5–15.5)
WBC: 6.4 10*3/uL (ref 4.0–10.5)

## 2015-12-03 LAB — I-STAT TROPONIN, ED: Troponin i, poc: 0.07 ng/mL (ref 0.00–0.08)

## 2015-12-03 LAB — BRAIN NATRIURETIC PEPTIDE: B Natriuretic Peptide: 311.6 pg/mL — ABNORMAL HIGH (ref 0.0–100.0)

## 2015-12-03 MED ORDER — METHYLPREDNISOLONE SODIUM SUCC 125 MG IJ SOLR
80.0000 mg | Freq: Once | INTRAMUSCULAR | Status: AC
  Administered 2015-12-03: 80 mg via INTRAVENOUS
  Filled 2015-12-03: qty 2

## 2015-12-03 MED ORDER — IOHEXOL 300 MG/ML  SOLN
25.0000 mL | INTRAMUSCULAR | Status: AC
  Administered 2015-12-03: 25 mL via ORAL

## 2015-12-03 MED ORDER — IPRATROPIUM-ALBUTEROL 0.5-2.5 (3) MG/3ML IN SOLN
3.0000 mL | Freq: Once | RESPIRATORY_TRACT | Status: AC
  Administered 2015-12-03: 3 mL via RESPIRATORY_TRACT
  Filled 2015-12-03: qty 3

## 2015-12-03 MED ORDER — ALBUTEROL SULFATE (2.5 MG/3ML) 0.083% IN NEBU
5.0000 mg | INHALATION_SOLUTION | Freq: Once | RESPIRATORY_TRACT | Status: AC
  Administered 2015-12-03: 5 mg via RESPIRATORY_TRACT
  Filled 2015-12-03: qty 6

## 2015-12-03 MED ORDER — IPRATROPIUM BROMIDE 0.02 % IN SOLN
0.5000 mg | Freq: Once | RESPIRATORY_TRACT | Status: AC
  Administered 2015-12-03: 0.5 mg via RESPIRATORY_TRACT
  Filled 2015-12-03: qty 2.5

## 2015-12-03 NOTE — ED Notes (Signed)
Patient transported to CT 

## 2015-12-03 NOTE — ED Provider Notes (Signed)
CSN: [id number]     Arrival date & time 12/03/15  1711 History   First MD Initiated Contact with Patient 12/03/15 1737     Chief Complaint  Patient presents with  . Cough  . Shortness of Breath     (Consider location/radiation/quality/duration/timing/severity/associated sxs/prior Treatment) HPI Comments: Patient with past medical history remarkable for CHF, hypertension, prior pneumonia, recent diverticulitis presents to the emergency department with chief complaint of cough, shortness of breath, and some left lower quadrant tenderness. She states that she has been coughing for the past several days. She states that she gets short of breath intermittently. She states that she monitors her oxygen saturation, and uses a nebulizer treatment when it drops below 90%. Additionally, she states that she has been being treated for diverticulitis with Cipro and Flagyl. She states that this made her sick, so her doctor switched her to Cipro only. She states that the pain has eased off, but is still present.  She denies nvd.  The history is provided by the patient. No language interpreter was used.    Past Medical History  Diagnosis Date  . Lower extremity edema     CHRONIC  . Hypothyroidism   . Anxiety and depression   . Hypertension   . Moderate aortic stenosis 05/2012    by ECHO  . Cervical disc disease     W radiculopathy S/P epidural injections  . Vitamin D deficiency   . Macular degeneration     Dr Hillis Range Follows for pt.  . Anxiety   . Depression   . Exertional shortness of breath     "worse recently" (01/13/2014)  . Pneumonia     "once before today" (01/13/2014)  . Bacterial pneumonia 01/13/2014  . Chronic bronchitis ([place])     "@ least once a year;  in the winter" (01/13/2014)  . Breast cancer ([place])   . Arthritis     "terribly in my hands" (01/13/2014)  . Chronic diastolic CHF (congestive heart failure) (HCC)     Preserved EF   Past Surgical History  Procedure Laterality Date  .  Cataract extraction w/ intraocular lens  implant, bilateral Bilateral ~ 2008  . Tonsillectomy  1949  . Abdominal hysterectomy    . Appendectomy  1949  . Breast biopsy Right ~ 2005  . Breast lumpectomy Right ~ 2005   Family History  Problem Relation Age of Onset  . CVA Father   . Pneumonia Father   . Multiple sclerosis Brother   . CVA Brother    Social History  Substance Use Topics  . Smoking status: Never Smoker   . Smokeless tobacco: Never Used  . Alcohol Use: No   OB History    No data available     Review of Systems  Constitutional: Negative for fever and chills.  Respiratory: Positive for cough, shortness of breath and wheezing.   Cardiovascular: Negative for chest pain.  Gastrointestinal: Positive for abdominal pain. Negative for nausea, vomiting, diarrhea and constipation.  Genitourinary: Negative for dysuria.      Allergies  Other; Codeine; and Penicillins  Home Medications   Prior to Admission medications   Medication Sig Start Date End Date Taking? Authorizing Provider  acetaminophen (TYLENOL) 325 MG tablet Take 2 tablets (650 mg total) by mouth every 6 (six) hours as needed (pain or Fever >/= 101). 01/18/14   Modena Jansky, MD  albuterol (PROVENTIL HFA;VENTOLIN HFA) 108 (90 BASE) MCG/ACT inhaler Inhale 1-2 puffs into the lungs every 6 (six)  hours as needed for wheezing or shortness of breath. 01/18/14   Modena Jansky, MD  amLODipine (NORVASC) 2.5 MG tablet Take 1 tablet (2.5 mg total) by mouth daily. 09/15/15   Sueanne Margarita, MD  aspirin 81 MG EC tablet Take 1 tablet (81 mg total) by mouth every other day. 06/28/15   Sueanne Margarita, MD  Cholecalciferol 50000 UNITS capsule Take 50,000 Units by mouth every Wednesday.    Historical Provider, MD  furosemide (LASIX) 40 MG tablet Take 1 tablet (40 mg total) by mouth daily. 03/09/15   Sueanne Margarita, MD  levothyroxine (SYNTHROID, LEVOTHROID) 75 MCG tablet Take 75 mcg by mouth daily before breakfast.    Historical  Provider, MD  Multiple Vitamin (MULTIVITAMIN WITH MINERALS) TABS Take 1 tablet by mouth daily.    Historical Provider, MD  nitroGLYCERIN (NITROSTAT) 0.4 MG SL tablet Place 1 tablet (0.4 mg total) under the tongue every 5 (five) minutes as needed for chest pain. 06/13/15   Sueanne Margarita, MD  Polyethyl Glycol-Propyl Glycol (SYSTANE OP) Apply 1-2 drops to eye 4 (four) times daily as needed (dry eyes).    Historical Provider, MD  potassium chloride (K-DUR) 10 MEQ tablet Take 10 mEq by mouth daily.    Historical Provider, MD  vitamin C (ASCORBIC ACID) 500 MG tablet Take 500 mg by mouth daily.    Historical Provider, MD   BP 134/63 mmHg  Pulse 79  Temp(Src) 98.4 F (36.9 C) (Oral)  Resp 18  SpO2 91% Physical Exam  Constitutional: She is oriented to person, place, and time. She appears well-developed and well-nourished.  HENT:  Head: Normocephalic and atraumatic.  Eyes: Conjunctivae and EOM are normal. Pupils are equal, round, and reactive to light.  Neck: Normal range of motion. Neck supple.  Cardiovascular: Normal rate and regular rhythm.  Exam reveals no gallop and no friction rub.   No murmur heard. Pulmonary/Chest: Effort normal. No respiratory distress. She has wheezes. She has rales. She exhibits no tenderness.  Abdominal: Soft. Bowel sounds are normal. She exhibits no distension and no mass. There is tenderness. There is no rebound and no guarding.  Moderate LLQ tenderness, no other focal abdominal tenderness  Musculoskeletal: Normal range of motion. She exhibits no edema or tenderness.  Neurological: She is alert and oriented to person, place, and time.  Skin: Skin is warm and dry.  Psychiatric: She has a normal mood and affect. Her behavior is normal. Judgment and thought content normal.  Nursing note and vitals reviewed.   ED Course  Procedures (including critical care time) Results for orders placed or performed during the hospital encounter of 0000000  Basic metabolic panel   Result Value Ref Range   Sodium 132 (L) 135 - 145 mmol/L   Potassium 3.8 3.5 - 5.1 mmol/L   Chloride 93 (L) 101 - 111 mmol/L   CO2 26 22 - 32 mmol/L   Glucose, Bld 101 (H) 65 - 99 mg/dL   BUN 19 6 - 20 mg/dL   Creatinine, Ser 1.20 (H) 0.44 - 1.00 mg/dL   Calcium 9.0 8.9 - 10.3 mg/dL   GFR calc non Af Amer 36 (L) >60 mL/min   GFR calc Af Amer 42 (L) >60 mL/min   Anion gap 13 5 - 15  CBC  Result Value Ref Range   WBC 6.4 4.0 - 10.5 K/uL   RBC 3.67 (L) 3.87 - 5.11 MIL/uL   Hemoglobin 11.3 (L) 12.0 - 15.0 g/dL   HCT 33.5 (  L) 36.0 - 46.0 %   MCV 91.3 78.0 - 100.0 fL   MCH 30.8 26.0 - 34.0 pg   MCHC 33.7 30.0 - 36.0 g/dL   RDW 14.5 11.5 - 15.5 %   Platelets PLATELET CLUMPS NOTED ON SMEAR 150 - 400 K/uL  Brain natriuretic peptide  Result Value Ref Range   B Natriuretic Peptide 311.6 (H) 0.0 - 100.0 pg/mL  I-stat troponin, ED (not at [facility])  Result Value Ref Range   Troponin i, poc 0.07 0.00 - 0.08 ng/mL   Comment 3          I-Stat CG4 Lactic Acid, ED  Result Value Ref Range   Lactic Acid, Venous 1.94 0.5 - 2.0 mmol/L   Ct Abdomen Pelvis Wo Contrast  12/03/2015  CLINICAL DATA:  82 year old female with cough and shortness of breath being treated for diverticulitis. EXAM: CT ABDOMEN AND PELVIS WITHOUT CONTRAST TECHNIQUE: Multidetector CT imaging of the abdomen and pelvis was performed following the standard protocol without IV contrast. COMPARISON:  None. FINDINGS: Evaluation of this exam is limited due to respiratory motion artifact. Evaluation is also limited in the absence of intravenous contrast. There bibasilar linear atelectasis/scarring. There is calcification of the mitral annulus. There is apparent hypoattenuation of the cardiac blood pool suggestive of a degree of anemia. Clinical correlation is recommended. No intra-abdominal free air or free fluid. The liver, gallbladder, pancreas, spleen, adrenal glands, kidneys, visualized ureters, and urinary bladder appear unremarkable.  Hysterectomy. There is sigmoid diverticulosis with muscular hypertrophy. There is segmental thickening of the rectosigmoid which may be chronic or residual inflammation from recent diverticulitis. No significant pericolonic stranding noted. There is diffuse colonic diverticulosis without active inflammatory changes. No evidence of bowel obstruction. Normal appendix. There is aortoiliac atherosclerotic disease. The abdominal aorta is tortuous. No portal venous gas identified. There is no adenopathy. The abdominal wall soft tissues appear unremarkable. There is scoliosis with multilevel degenerative changes of the spine. No acute fracture. IMPRESSION: Sigmoid diverticulosis. Diffuse thickening of the rectosigmoid may be chronic or represent residual inflammation from recent diverticulitis. No significant pericolonic stranding. No evidence of bowel obstruction. Electronically Signed   By: Anner Crete M.D.   On: 12/03/2015 21:13   Dg Chest 2 View  12/03/2015  CLINICAL DATA:  Productive cough for 2 weeks, history of pneumonia EXAM: CHEST  2 VIEW COMPARISON:  06/05/2015 and 06/06/2015 FINDINGS: Cardiomegaly again noted. Mild perihilar bronchitic changes. No acute infiltrate or pulmonary edema. Thoracic spine osteopenia and mild degenerative changes thoracic spine. Again noted mitral valve calcifications. Surgical clips in right axilla. IMPRESSION: No infiltrate or pulmonary edema. Surgical clips are noted in right axilla. Again noted mitral valve calcifications Electronically Signed   By: Lahoma Crocker M.D.   On: 12/03/2015 18:26    I have personally reviewed and evaluated these images and lab results as part of my medical decision-making.   EKG Interpretation   Date/Time:  Saturday December 03 2015 17:24:05 EST Ventricular Rate:  79 PR Interval:  168 QRS Duration: 80 QT Interval:  396 QTC Calculation: [id number] Axis:   -17 Text Interpretation:  Normal sinus rhythm No significant change since last  tracing  Confirmed by Ashok Cordia  MD, Lennette Bihari ([id number]) on 12/03/2015 7:16:05 PM      MDM   Final diagnoses:  Cough  URI (upper respiratory infection)   Patient with history of CHF. Cough and SOB today.  Also being treated for diverticulitis.  Will check labs, CXR, and CT given that patient  still has persistent LLQ tenderness.  Wheezing slightly on exam.  Will give breathing treatment.  CT scan shows no evidence of obstruction, possible residual inflammation from recent diverticulitis. Patient still feels out of breath with activity, and is coughing. Chest x-ray is negative for pneumonia or infiltrate. Normal lactic acid, negative troponin. No leukocytosis. Patient seen by and discussed with Dr. Ashok Cordia, who advises admission secondary to persistent dyspnea. I consult the hospitalist, who is agreeable with the plan to admit, however the patient adamantly refuses to be admitted to the hospital. She states that she will do fine at home and wants to stay in her own bed. I will give her an additional breathing treatment and Solu-Medrol. I discussed this with Dr. Ashok Cordia.  Still feel that it would likely be best for patient to stay in the hospital, however patient is of sound mind and is capable of making her own medical decisions. She states that she will return if she has never worsening symptoms. She does have home oxygen. I have instructed her to use this. Specific and clear return precautions have been given in written and verbal form.    Montine Circle, PA-C 12/03/15 [place], MD 12/04/15 1539

## 2015-12-03 NOTE — ED Notes (Signed)
Pt's son came out to RN station, stating that pt now wishes to just go home. Agreed to received ordered treatment and will re-evaluate.

## 2015-12-03 NOTE — ED Notes (Signed)
Patient returned from X-ray 

## 2015-12-03 NOTE — ED Notes (Signed)
Pt sts productive cough and SOB; pt sts being treated for diverticulitis

## 2015-12-03 NOTE — Discharge Instructions (Signed)
Upper Respiratory Infection, Adult Most upper respiratory infections (URIs) are a viral infection of the air passages leading to the lungs. A URI affects the nose, throat, and upper air passages. The most common type of URI is nasopharyngitis and is typically referred to as "the common cold." URIs run their course and usually go away on their own. Most of the time, a URI does not require medical attention, but sometimes a bacterial infection in the upper airways can follow a viral infection. This is called a secondary infection. Sinus and middle ear infections are common types of secondary upper respiratory infections. Bacterial pneumonia can also complicate a URI. A URI can worsen asthma and chronic obstructive pulmonary disease (COPD). Sometimes, these complications can require emergency medical care and may be life threatening.  CAUSES Almost all URIs are caused by viruses. A virus is a type of germ and can spread from one person to another.  RISKS FACTORS You may be at risk for a URI if:   You smoke.   You have chronic heart or lung disease.  You have a weakened defense (immune) system.   You are very young or very old.   You have nasal allergies or asthma.  You work in crowded or poorly ventilated areas.  You work in health care facilities or schools. SIGNS AND SYMPTOMS  Symptoms typically develop 2-3 days after you come in contact with a cold virus. Most viral URIs last 7-10 days. However, viral URIs from the influenza virus (flu virus) can last 14-18 days and are typically more severe. Symptoms may include:   Runny or stuffy (congested) nose.   Sneezing.   Cough.   Sore throat.   Headache.   Fatigue.   Fever.   Loss of appetite.   Pain in your forehead, behind your eyes, and over your cheekbones (sinus pain).  Muscle aches.  DIAGNOSIS  Your health care provider may diagnose a URI by:  Physical exam.  Tests to check that your symptoms are not due to  another condition such as:  Strep throat.  Sinusitis.  Pneumonia.  Asthma. TREATMENT  A URI goes away on its own with time. It cannot be cured with medicines, but medicines may be prescribed or recommended to relieve symptoms. Medicines may help:  Reduce your fever.  Reduce your cough.  Relieve nasal congestion. HOME CARE INSTRUCTIONS   Take medicines only as directed by your health care provider.   Gargle warm saltwater or take cough drops to comfort your throat as directed by your health care provider.  Use a warm mist humidifier or inhale steam from a shower to increase air moisture. This may make it easier to breathe.  Drink enough fluid to keep your urine clear or pale yellow.   Eat soups and other clear broths and maintain good nutrition.   Rest as needed.   Return to work when your temperature has returned to normal or as your health care provider advises. You may need to stay home longer to avoid infecting others. You can also use a face mask and careful hand washing to prevent spread of the virus.  Increase the usage of your inhaler if you have asthma.   Do not use any tobacco products, including cigarettes, chewing tobacco, or electronic cigarettes. If you need help quitting, ask your health care provider. PREVENTION  The best way to protect yourself from getting a cold is to practice good hygiene.   Avoid oral or hand contact with people with cold   symptoms.   Wash your hands often if contact occurs.  There is no clear evidence that vitamin C, vitamin E, echinacea, or exercise reduces the chance of developing a cold. However, it is always recommended to get plenty of rest, exercise, and practice good nutrition.  SEEK MEDICAL CARE IF:   You are getting worse rather than better.   Your symptoms are not controlled by medicine.   You have chills.  You have worsening shortness of breath.  You have brown or red mucus.  You have yellow or brown nasal  discharge.  You have pain in your face, especially when you bend forward.  You have a fever.  You have swollen neck glands.  You have pain while swallowing.  You have white areas in the back of your throat. SEEK IMMEDIATE MEDICAL CARE IF:   You have severe or persistent:  Headache.  Ear pain.  Sinus pain.  Chest pain.  You have chronic lung disease and any of the following:  Wheezing.  Prolonged cough.  Coughing up blood.  A change in your usual mucus.  You have a stiff neck.  You have changes in your:  Vision.  Hearing.  Thinking.  Mood. MAKE SURE YOU:   Understand these instructions.  Will watch your condition.  Will get help right away if you are not doing well or get worse.   This information is not intended to replace advice given to you by your health care provider. Make sure you discuss any questions you have with your health care provider.   Document Released: 04/24/2001 Document Revised: 03/15/2015 Document Reviewed: 02/03/2014 Elsevier Interactive Patient Education 2016 Elsevier Inc.  

## 2015-12-03 NOTE — ED Notes (Signed)
Patient transported to X-ray 

## 2015-12-03 NOTE — ED Notes (Signed)
Pt departed in NAD.  

## 2015-12-03 NOTE — ED Notes (Signed)
Pt finished drinking contrast solution.

## 2015-12-06 DIAGNOSIS — M79642 Pain in left hand: Secondary | ICD-10-CM | POA: Diagnosis not present

## 2015-12-06 DIAGNOSIS — M79641 Pain in right hand: Secondary | ICD-10-CM | POA: Diagnosis not present

## 2015-12-06 DIAGNOSIS — M25511 Pain in right shoulder: Secondary | ICD-10-CM | POA: Diagnosis not present

## 2015-12-06 DIAGNOSIS — G5601 Carpal tunnel syndrome, right upper limb: Secondary | ICD-10-CM | POA: Diagnosis not present

## 2015-12-06 DIAGNOSIS — M6281 Muscle weakness (generalized): Secondary | ICD-10-CM | POA: Diagnosis not present

## 2015-12-07 DIAGNOSIS — G5601 Carpal tunnel syndrome, right upper limb: Secondary | ICD-10-CM | POA: Diagnosis not present

## 2015-12-07 DIAGNOSIS — M79641 Pain in right hand: Secondary | ICD-10-CM | POA: Diagnosis not present

## 2015-12-08 DIAGNOSIS — G5601 Carpal tunnel syndrome, right upper limb: Secondary | ICD-10-CM | POA: Diagnosis not present

## 2015-12-08 DIAGNOSIS — M25511 Pain in right shoulder: Secondary | ICD-10-CM | POA: Diagnosis not present

## 2015-12-08 DIAGNOSIS — M6281 Muscle weakness (generalized): Secondary | ICD-10-CM | POA: Diagnosis not present

## 2015-12-08 DIAGNOSIS — M79642 Pain in left hand: Secondary | ICD-10-CM | POA: Diagnosis not present

## 2015-12-08 DIAGNOSIS — M79641 Pain in right hand: Secondary | ICD-10-CM | POA: Diagnosis not present

## 2015-12-12 DIAGNOSIS — M79642 Pain in left hand: Secondary | ICD-10-CM | POA: Diagnosis not present

## 2015-12-12 DIAGNOSIS — G5601 Carpal tunnel syndrome, right upper limb: Secondary | ICD-10-CM | POA: Diagnosis not present

## 2015-12-12 DIAGNOSIS — M6281 Muscle weakness (generalized): Secondary | ICD-10-CM | POA: Diagnosis not present

## 2015-12-12 DIAGNOSIS — M25511 Pain in right shoulder: Secondary | ICD-10-CM | POA: Diagnosis not present

## 2015-12-12 DIAGNOSIS — M79641 Pain in right hand: Secondary | ICD-10-CM | POA: Diagnosis not present

## 2015-12-13 DIAGNOSIS — M79642 Pain in left hand: Secondary | ICD-10-CM | POA: Diagnosis not present

## 2015-12-13 DIAGNOSIS — M6281 Muscle weakness (generalized): Secondary | ICD-10-CM | POA: Diagnosis not present

## 2015-12-13 DIAGNOSIS — G5601 Carpal tunnel syndrome, right upper limb: Secondary | ICD-10-CM | POA: Diagnosis not present

## 2015-12-13 DIAGNOSIS — M79641 Pain in right hand: Secondary | ICD-10-CM | POA: Diagnosis not present

## 2015-12-13 DIAGNOSIS — M25511 Pain in right shoulder: Secondary | ICD-10-CM | POA: Diagnosis not present

## 2015-12-15 ENCOUNTER — Telehealth: Payer: Self-pay | Admitting: Cardiology

## 2015-12-15 DIAGNOSIS — M79642 Pain in left hand: Secondary | ICD-10-CM | POA: Diagnosis not present

## 2015-12-15 DIAGNOSIS — M25511 Pain in right shoulder: Secondary | ICD-10-CM | POA: Diagnosis not present

## 2015-12-15 DIAGNOSIS — G5601 Carpal tunnel syndrome, right upper limb: Secondary | ICD-10-CM | POA: Diagnosis not present

## 2015-12-15 DIAGNOSIS — M79641 Pain in right hand: Secondary | ICD-10-CM | POA: Diagnosis not present

## 2015-12-15 DIAGNOSIS — M6281 Muscle weakness (generalized): Secondary | ICD-10-CM | POA: Diagnosis not present

## 2015-12-15 NOTE — Telephone Encounter (Signed)
Left message to call back  

## 2015-12-15 NOTE — Telephone Encounter (Signed)
New message     Patient calling wants to discuss letter she receive from her insurance company - oxygen

## 2015-12-15 NOTE — Telephone Encounter (Signed)
Follow up ° ° ° ° ° °Returning a call to the nurse °

## 2015-12-19 DIAGNOSIS — M6281 Muscle weakness (generalized): Secondary | ICD-10-CM | POA: Diagnosis not present

## 2015-12-19 DIAGNOSIS — M79641 Pain in right hand: Secondary | ICD-10-CM | POA: Diagnosis not present

## 2015-12-19 DIAGNOSIS — M25511 Pain in right shoulder: Secondary | ICD-10-CM | POA: Diagnosis not present

## 2015-12-19 DIAGNOSIS — G5601 Carpal tunnel syndrome, right upper limb: Secondary | ICD-10-CM | POA: Diagnosis not present

## 2015-12-19 DIAGNOSIS — M79642 Pain in left hand: Secondary | ICD-10-CM | POA: Diagnosis not present

## 2015-12-20 DIAGNOSIS — M6281 Muscle weakness (generalized): Secondary | ICD-10-CM | POA: Diagnosis not present

## 2015-12-20 DIAGNOSIS — M79642 Pain in left hand: Secondary | ICD-10-CM | POA: Diagnosis not present

## 2015-12-20 DIAGNOSIS — M25511 Pain in right shoulder: Secondary | ICD-10-CM | POA: Diagnosis not present

## 2015-12-20 DIAGNOSIS — G5601 Carpal tunnel syndrome, right upper limb: Secondary | ICD-10-CM | POA: Diagnosis not present

## 2015-12-20 DIAGNOSIS — M79641 Pain in right hand: Secondary | ICD-10-CM | POA: Diagnosis not present

## 2015-12-20 NOTE — Telephone Encounter (Signed)
Late entry:  Patient st she received a letter from [place] stating Medicare won't pay for her O2 unless she has an office visit stating necessity.  Reminded patient she switched from Dr. Radford Pax to Dr. Stanford Breed so she could be seen in [place].  Instructed scheduler to call and schedule appointment with Dr. Stanford Breed in the time frame listed. Patient grateful for help.

## 2015-12-22 ENCOUNTER — Ambulatory Visit (INDEPENDENT_AMBULATORY_CARE_PROVIDER_SITE_OTHER): Payer: Medicare Other | Admitting: Podiatry

## 2015-12-22 ENCOUNTER — Encounter: Payer: Self-pay | Admitting: Podiatry

## 2015-12-22 VITALS — BP 145/65 | HR 70

## 2015-12-22 DIAGNOSIS — M6281 Muscle weakness (generalized): Secondary | ICD-10-CM | POA: Diagnosis not present

## 2015-12-22 DIAGNOSIS — M79642 Pain in left hand: Secondary | ICD-10-CM | POA: Diagnosis not present

## 2015-12-22 DIAGNOSIS — M79606 Pain in leg, unspecified: Secondary | ICD-10-CM | POA: Diagnosis not present

## 2015-12-22 DIAGNOSIS — M79641 Pain in right hand: Secondary | ICD-10-CM | POA: Diagnosis not present

## 2015-12-22 DIAGNOSIS — B351 Tinea unguium: Secondary | ICD-10-CM

## 2015-12-22 DIAGNOSIS — G5601 Carpal tunnel syndrome, right upper limb: Secondary | ICD-10-CM | POA: Diagnosis not present

## 2015-12-22 DIAGNOSIS — M25511 Pain in right shoulder: Secondary | ICD-10-CM | POA: Diagnosis not present

## 2015-12-22 NOTE — Progress Notes (Signed)
HPI: FU diastolic CHF and AS; previously followed by Dr Radford Pax. Echo 4/15 showed moderate AS with mean gradient 28 mmHg. Last echo 7/16 showed normal LV function, mild AS, moderate AI, mild MS with mean gradient 6 mmHg, moderately elevated pulmonary hypertension. Since last seen, Patient denies dyspnea, chest pain, palpitations or syncope. Mild pedal edema.  Current Outpatient Prescriptions  Medication Sig Dispense Refill  . acetaminophen (TYLENOL) 325 MG tablet Take 2 tablets (650 mg total) by mouth every 6 (six) hours as needed (pain or Fever >/= 101).    Marland Kitchen albuterol (PROVENTIL HFA;VENTOLIN HFA) 108 (90 BASE) MCG/ACT inhaler Inhale 1-2 puffs into the lungs every 6 (six) hours as needed for wheezing or shortness of breath. 1 Inhaler 0  . albuterol (PROVENTIL) (2.5 MG/3ML) 0.083% nebulizer solution Inhale 3 mLs into the lungs every 6 (six) hours as needed for wheezing or shortness of breath.     Marland Kitchen amLODipine (NORVASC) 2.5 MG tablet Take 1 tablet (2.5 mg total) by mouth daily. 90 tablet 2  . aspirin 81 MG EC tablet Take 1 tablet (81 mg total) by mouth every other day. 30 tablet 0  . Cholecalciferol 2000 units CAPS Take 2,000 Units by mouth every evening.    . furosemide (LASIX) 40 MG tablet Take 1 tablet (40 mg total) by mouth daily. 30 tablet 6  . levothyroxine (SYNTHROID, LEVOTHROID) 75 MCG tablet Take 75 mcg by mouth daily before breakfast.    . Multiple Vitamin (MULTIVITAMIN WITH MINERALS) TABS Take 1 tablet by mouth daily.    . Multiple Vitamins-Minerals (PRESERVISION AREDS 2 PO) Take 1 tablet by mouth as directed.    . nitroGLYCERIN (NITROSTAT) 0.4 MG SL tablet Place 1 tablet (0.4 mg total) under the tongue every 5 (five) minutes as needed for chest pain. 25 tablet 1  . pindolol (VISKEN) 5 MG tablet Take 5 mg by mouth 2 (two) times daily.    . potassium chloride (K-DUR) 10 MEQ tablet Take 10 mEq by mouth daily.    . Probiotic Product (SOLUBLE FIBER/PROBIOTICS PO) Take 1 tablet by  mouth daily.    . vitamin C (ASCORBIC ACID) 500 MG tablet Take 500 mg by mouth daily.     No current facility-administered medications for this visit.     Past Medical History  Diagnosis Date  . Lower extremity edema     CHRONIC  . Hypothyroidism   . Anxiety and depression   . Hypertension   . Moderate aortic stenosis 05/2012    by ECHO  . Cervical disc disease     W radiculopathy S/P epidural injections  . Vitamin D deficiency   . Macular degeneration     Dr Hillis Range Follows for pt.  . Anxiety   . Depression   . Exertional shortness of breath     "worse recently" (01/13/2014)  . Pneumonia     "once before today" (01/13/2014)  . Bacterial pneumonia 01/13/2014  . Chronic bronchitis ([place])     "@ least once a year;  in the winter" (01/13/2014)  . Breast cancer ([place])   . Arthritis     "terribly in my hands" (01/13/2014)  . Chronic diastolic CHF (congestive heart failure) (HCC)     Preserved EF    Past Surgical History  Procedure Laterality Date  . Cataract extraction w/ intraocular lens  implant, bilateral Bilateral ~ 2008  . Tonsillectomy  1949  . Abdominal hysterectomy    . Appendectomy  1949  . Breast  biopsy Right ~ 2005  . Breast lumpectomy Right ~ 2005    Social History   Social History  . Marital Status: Widowed    Spouse Name: N/A  . Number of Children: N/A  . Years of Education: N/A   Occupational History  . Not on file.   Social History Main Topics  . Smoking status: Never Smoker   . Smokeless tobacco: Never Used  . Alcohol Use: No  . Drug Use: No  . Sexual Activity: No   Other Topics Concern  . Not on file   Social History Narrative    Family History  Problem Relation Age of Onset  . CVA Father   . Pneumonia Father   . Multiple sclerosis Brother   . CVA Brother     ROS: no fevers or chills, productive cough, hemoptysis, dysphasia, odynophagia, melena, hematochezia, dysuria, hematuria, rash, seizure activity, orthopnea, PND, pedal edema,  claudication. Remaining systems are negative.  Physical Exam: Well-developed well-nourished in no acute distress.  Skin is warm and dry.  HEENT is normal.  Neck is supple.  Chest is clear to auscultation with normal expansion.  Cardiovascular exam is regular rate and rhythm. 3/6 systolic murmur left sternal border. Abdominal exam nontender or distended. No masses palpated. Extremities show 1+ ankle edema. neuro grossly intact  ECG 12/03/2015-sinus rhythm with poor R-wave progression cannot rule out prior anterior infarct.

## 2015-12-26 DIAGNOSIS — M79641 Pain in right hand: Secondary | ICD-10-CM | POA: Diagnosis not present

## 2015-12-26 DIAGNOSIS — G5601 Carpal tunnel syndrome, right upper limb: Secondary | ICD-10-CM | POA: Diagnosis not present

## 2015-12-26 DIAGNOSIS — M25511 Pain in right shoulder: Secondary | ICD-10-CM | POA: Diagnosis not present

## 2015-12-26 DIAGNOSIS — M79642 Pain in left hand: Secondary | ICD-10-CM | POA: Diagnosis not present

## 2015-12-26 DIAGNOSIS — M6281 Muscle weakness (generalized): Secondary | ICD-10-CM | POA: Diagnosis not present

## 2015-12-26 NOTE — Patient Instructions (Signed)
Seen for hypertrophic nails. All nails debrided. Return in 3 months or as needed.  

## 2015-12-26 NOTE — Progress Notes (Signed)
SUBJECTIVE: 82 y.o. year old female presents requesting painful toe nails trimmed. Patient wears compression stockings for ankle edema. Having hard of hearing.   OBJECTIVE: DERMATOLOGIC EXAMINATION: Painful ingrown nails both great toes without open infection or drainage. Hypertrophic nails x 10. VASCULAR EXAMINATION OF LOWER LIMBS: Pedal pulses: All pedal pulses are palpable with normal pulsation. Minimum edema noted. Temperature gradient from tibial crest to dorsum of foot is within normal bilateral. NEUROLOGIC EXAMINATION OF THE LOWER LIMBS: All epicritic and tactile sensations grossly intact. MUSCULOSKELETAL EXAMINATION: Mild digital contracture lesser digits bilateral.  ASSESSMENT: Onychomycosis x 10. Ingrown nail both great toes. Pain in lower limb.  Plan: All nails debrided. Pain was relieved. Return in 3 months.

## 2015-12-28 ENCOUNTER — Encounter: Payer: Self-pay | Admitting: Cardiology

## 2015-12-28 ENCOUNTER — Ambulatory Visit (INDEPENDENT_AMBULATORY_CARE_PROVIDER_SITE_OTHER): Payer: Medicare Other | Admitting: Cardiology

## 2015-12-28 VITALS — BP 144/79 | HR 70 | Ht 59.0 in | Wt 172.1 lb

## 2015-12-28 DIAGNOSIS — I350 Nonrheumatic aortic (valve) stenosis: Secondary | ICD-10-CM

## 2015-12-28 DIAGNOSIS — I5032 Chronic diastolic (congestive) heart failure: Secondary | ICD-10-CM | POA: Diagnosis not present

## 2015-12-28 DIAGNOSIS — I10 Essential (primary) hypertension: Secondary | ICD-10-CM | POA: Diagnosis not present

## 2015-12-28 MED ORDER — POTASSIUM CHLORIDE ER 10 MEQ PO TBCR: 10.0000 meq | EXTENDED_RELEASE_TABLET | Freq: Every day | ORAL | Status: DC

## 2015-12-28 MED ORDER — LEVOTHYROXINE SODIUM 75 MCG PO TABS: 75.0000 ug | ORAL_TABLET | Freq: Every day | ORAL | Status: DC

## 2015-12-28 MED ORDER — AMLODIPINE BESYLATE 2.5 MG PO TABS: 2.5000 mg | ORAL_TABLET | Freq: Every day | ORAL | Status: DC

## 2015-12-28 MED ORDER — FUROSEMIDE 40 MG PO TABS: 40.0000 mg | ORAL_TABLET | Freq: Every day | ORAL | Status: DC

## 2015-12-28 NOTE — Assessment & Plan Note (Addendum)
Patient has both aortic stenosis and aortic insufficiency. We will plan conservative measures given her age. We will not pursue follow-up echocardiograms and patient in agreement. Note approximately 20 minutes spent reviewing records prior to patient arrival.

## 2015-12-28 NOTE — Patient Instructions (Signed)
Your physician wants you to follow-up in: 6 MONTHS WITH DR [name] You will receive a reminder letter in the mail two months in advance. If you don't receive a letter, please call our office to schedule the follow-up appointment.  

## 2015-12-28 NOTE — Assessment & Plan Note (Addendum)
Plan to continue present dose of Lasix. Note patient is continuing to use home oxygen and is benefiting from the use of this at night.

## 2015-12-28 NOTE — Assessment & Plan Note (Signed)
Blood pressure controlled. Continue present medications. 

## 2015-12-29 DIAGNOSIS — M79641 Pain in right hand: Secondary | ICD-10-CM | POA: Diagnosis not present

## 2015-12-29 DIAGNOSIS — M79642 Pain in left hand: Secondary | ICD-10-CM | POA: Diagnosis not present

## 2015-12-29 DIAGNOSIS — G5601 Carpal tunnel syndrome, right upper limb: Secondary | ICD-10-CM | POA: Diagnosis not present

## 2015-12-29 DIAGNOSIS — M25511 Pain in right shoulder: Secondary | ICD-10-CM | POA: Diagnosis not present

## 2015-12-29 DIAGNOSIS — M6281 Muscle weakness (generalized): Secondary | ICD-10-CM | POA: Diagnosis not present

## 2015-12-30 DIAGNOSIS — H35433 Paving stone degeneration of retina, bilateral: Secondary | ICD-10-CM | POA: Diagnosis not present

## 2015-12-30 DIAGNOSIS — H353232 Exudative age-related macular degeneration, bilateral, with inactive choroidal neovascularization: Secondary | ICD-10-CM | POA: Diagnosis not present

## 2015-12-30 DIAGNOSIS — H43813 Vitreous degeneration, bilateral: Secondary | ICD-10-CM | POA: Diagnosis not present

## 2015-12-30 DIAGNOSIS — H15833 Staphyloma posticum, bilateral: Secondary | ICD-10-CM | POA: Diagnosis not present

## 2016-01-18 DIAGNOSIS — M7021 Olecranon bursitis, right elbow: Secondary | ICD-10-CM | POA: Diagnosis not present

## 2016-01-18 DIAGNOSIS — M7072 Other bursitis of hip, left hip: Secondary | ICD-10-CM | POA: Diagnosis not present

## 2016-01-20 DIAGNOSIS — J209 Acute bronchitis, unspecified: Secondary | ICD-10-CM | POA: Diagnosis not present

## 2016-02-03 DIAGNOSIS — M1711 Unilateral primary osteoarthritis, right knee: Secondary | ICD-10-CM | POA: Diagnosis not present

## 2016-02-03 DIAGNOSIS — M7061 Trochanteric bursitis, right hip: Secondary | ICD-10-CM | POA: Diagnosis not present

## 2016-02-03 DIAGNOSIS — M25461 Effusion, right knee: Secondary | ICD-10-CM | POA: Diagnosis not present

## 2016-02-28 DIAGNOSIS — H353221 Exudative age-related macular degeneration, left eye, with active choroidal neovascularization: Secondary | ICD-10-CM | POA: Diagnosis not present

## 2016-03-06 DIAGNOSIS — H35433 Paving stone degeneration of retina, bilateral: Secondary | ICD-10-CM | POA: Diagnosis not present

## 2016-03-06 DIAGNOSIS — H43813 Vitreous degeneration, bilateral: Secondary | ICD-10-CM | POA: Diagnosis not present

## 2016-03-06 DIAGNOSIS — H353232 Exudative age-related macular degeneration, bilateral, with inactive choroidal neovascularization: Secondary | ICD-10-CM | POA: Diagnosis not present

## 2016-03-20 ENCOUNTER — Ambulatory Visit: Payer: Medicare Other | Admitting: Podiatry

## 2016-03-22 ENCOUNTER — Encounter: Payer: Self-pay | Admitting: Podiatry

## 2016-03-22 ENCOUNTER — Ambulatory Visit (INDEPENDENT_AMBULATORY_CARE_PROVIDER_SITE_OTHER): Payer: Medicare Other | Admitting: Podiatry

## 2016-03-22 ENCOUNTER — Ambulatory Visit: Payer: Medicare Other | Admitting: Podiatry

## 2016-03-22 DIAGNOSIS — B351 Tinea unguium: Secondary | ICD-10-CM

## 2016-03-22 DIAGNOSIS — M79606 Pain in leg, unspecified: Secondary | ICD-10-CM

## 2016-03-22 NOTE — Patient Instructions (Signed)
Seen for hypertrophic nails. All nails debrided. Return in 3 months or as needed.  

## 2016-03-22 NOTE — Progress Notes (Signed)
SUBJECTIVE: 82 y.o. year old female presents using walker requesting painful toe nails trimmed. Left big toe has ingrown nail and hurts to walk. Patient wears compression stockings for ankle edema. Having hard of hearing.   OBJECTIVE: DERMATOLOGIC EXAMINATION: Painful ingrown nails both great toes without open infection or drainage. Hypertrophic nails x 10. VASCULAR EXAMINATION OF LOWER LIMBS: Pedal pulses: All pedal pulses are palpable with normal pulsation. Minimum edema noted. Temperature gradient from tibial crest to dorsum of foot is within normal bilateral. NEUROLOGIC EXAMINATION OF THE LOWER LIMBS: All epicritic and tactile sensations grossly intact. MUSCULOSKELETAL EXAMINATION: Mild digital contracture lesser digits bilateral.  ASSESSMENT: Onychomycosis x 10. Ingrown nail both great toes. Pain in lower limb.  Plan: All nails debrided. Pain was relieved. Return in 3 months

## 2016-03-27 DIAGNOSIS — R296 Repeated falls: Secondary | ICD-10-CM | POA: Diagnosis not present

## 2016-03-27 DIAGNOSIS — R262 Difficulty in walking, not elsewhere classified: Secondary | ICD-10-CM | POA: Diagnosis not present

## 2016-03-27 DIAGNOSIS — Z9181 History of falling: Secondary | ICD-10-CM | POA: Diagnosis not present

## 2016-03-27 DIAGNOSIS — M6281 Muscle weakness (generalized): Secondary | ICD-10-CM | POA: Diagnosis not present

## 2016-03-27 DIAGNOSIS — M25551 Pain in right hip: Secondary | ICD-10-CM | POA: Diagnosis not present

## 2016-03-27 DIAGNOSIS — M25561 Pain in right knee: Secondary | ICD-10-CM | POA: Diagnosis not present

## 2016-03-28 DIAGNOSIS — M25561 Pain in right knee: Secondary | ICD-10-CM | POA: Diagnosis not present

## 2016-03-28 DIAGNOSIS — Z9181 History of falling: Secondary | ICD-10-CM | POA: Diagnosis not present

## 2016-03-28 DIAGNOSIS — R262 Difficulty in walking, not elsewhere classified: Secondary | ICD-10-CM | POA: Diagnosis not present

## 2016-03-28 DIAGNOSIS — M6281 Muscle weakness (generalized): Secondary | ICD-10-CM | POA: Diagnosis not present

## 2016-03-28 DIAGNOSIS — R296 Repeated falls: Secondary | ICD-10-CM | POA: Diagnosis not present

## 2016-03-28 DIAGNOSIS — M25551 Pain in right hip: Secondary | ICD-10-CM | POA: Diagnosis not present

## 2016-03-30 DIAGNOSIS — M25561 Pain in right knee: Secondary | ICD-10-CM | POA: Diagnosis not present

## 2016-03-30 DIAGNOSIS — M6281 Muscle weakness (generalized): Secondary | ICD-10-CM | POA: Diagnosis not present

## 2016-03-30 DIAGNOSIS — Z9181 History of falling: Secondary | ICD-10-CM | POA: Diagnosis not present

## 2016-03-30 DIAGNOSIS — M25551 Pain in right hip: Secondary | ICD-10-CM | POA: Diagnosis not present

## 2016-03-30 DIAGNOSIS — R262 Difficulty in walking, not elsewhere classified: Secondary | ICD-10-CM | POA: Diagnosis not present

## 2016-03-30 DIAGNOSIS — R296 Repeated falls: Secondary | ICD-10-CM | POA: Diagnosis not present

## 2016-04-02 DIAGNOSIS — M25561 Pain in right knee: Secondary | ICD-10-CM | POA: Diagnosis not present

## 2016-04-02 DIAGNOSIS — Z9181 History of falling: Secondary | ICD-10-CM | POA: Diagnosis not present

## 2016-04-02 DIAGNOSIS — M6281 Muscle weakness (generalized): Secondary | ICD-10-CM | POA: Diagnosis not present

## 2016-04-02 DIAGNOSIS — R262 Difficulty in walking, not elsewhere classified: Secondary | ICD-10-CM | POA: Diagnosis not present

## 2016-04-02 DIAGNOSIS — R296 Repeated falls: Secondary | ICD-10-CM | POA: Diagnosis not present

## 2016-04-02 DIAGNOSIS — M25551 Pain in right hip: Secondary | ICD-10-CM | POA: Diagnosis not present

## 2016-04-04 DIAGNOSIS — M25551 Pain in right hip: Secondary | ICD-10-CM | POA: Diagnosis not present

## 2016-04-04 DIAGNOSIS — M25561 Pain in right knee: Secondary | ICD-10-CM | POA: Diagnosis not present

## 2016-04-04 DIAGNOSIS — R262 Difficulty in walking, not elsewhere classified: Secondary | ICD-10-CM | POA: Diagnosis not present

## 2016-04-04 DIAGNOSIS — M6281 Muscle weakness (generalized): Secondary | ICD-10-CM | POA: Diagnosis not present

## 2016-04-04 DIAGNOSIS — Z9181 History of falling: Secondary | ICD-10-CM | POA: Diagnosis not present

## 2016-04-04 DIAGNOSIS — R296 Repeated falls: Secondary | ICD-10-CM | POA: Diagnosis not present

## 2016-04-05 ENCOUNTER — Telehealth: Payer: Self-pay | Admitting: *Deleted

## 2016-04-05 NOTE — Telephone Encounter (Signed)
Certificate of medical necessity paperwork for oxygen faxed.

## 2016-04-06 DIAGNOSIS — M6281 Muscle weakness (generalized): Secondary | ICD-10-CM | POA: Diagnosis not present

## 2016-04-06 DIAGNOSIS — R262 Difficulty in walking, not elsewhere classified: Secondary | ICD-10-CM | POA: Diagnosis not present

## 2016-04-06 DIAGNOSIS — R296 Repeated falls: Secondary | ICD-10-CM | POA: Diagnosis not present

## 2016-04-06 DIAGNOSIS — M25561 Pain in right knee: Secondary | ICD-10-CM | POA: Diagnosis not present

## 2016-04-06 DIAGNOSIS — M25551 Pain in right hip: Secondary | ICD-10-CM | POA: Diagnosis not present

## 2016-04-06 DIAGNOSIS — Z9181 History of falling: Secondary | ICD-10-CM | POA: Diagnosis not present

## 2016-04-10 DIAGNOSIS — R262 Difficulty in walking, not elsewhere classified: Secondary | ICD-10-CM | POA: Diagnosis not present

## 2016-04-10 DIAGNOSIS — Z9181 History of falling: Secondary | ICD-10-CM | POA: Diagnosis not present

## 2016-04-10 DIAGNOSIS — M6281 Muscle weakness (generalized): Secondary | ICD-10-CM | POA: Diagnosis not present

## 2016-04-10 DIAGNOSIS — R296 Repeated falls: Secondary | ICD-10-CM | POA: Diagnosis not present

## 2016-04-10 DIAGNOSIS — M25551 Pain in right hip: Secondary | ICD-10-CM | POA: Diagnosis not present

## 2016-04-10 DIAGNOSIS — M25561 Pain in right knee: Secondary | ICD-10-CM | POA: Diagnosis not present

## 2016-04-12 DIAGNOSIS — R296 Repeated falls: Secondary | ICD-10-CM | POA: Diagnosis not present

## 2016-04-12 DIAGNOSIS — Z9181 History of falling: Secondary | ICD-10-CM | POA: Diagnosis not present

## 2016-04-12 DIAGNOSIS — M25561 Pain in right knee: Secondary | ICD-10-CM | POA: Diagnosis not present

## 2016-04-12 DIAGNOSIS — R262 Difficulty in walking, not elsewhere classified: Secondary | ICD-10-CM | POA: Diagnosis not present

## 2016-04-12 DIAGNOSIS — M6281 Muscle weakness (generalized): Secondary | ICD-10-CM | POA: Diagnosis not present

## 2016-04-12 DIAGNOSIS — M25551 Pain in right hip: Secondary | ICD-10-CM | POA: Diagnosis not present

## 2016-04-13 DIAGNOSIS — R296 Repeated falls: Secondary | ICD-10-CM | POA: Diagnosis not present

## 2016-04-13 DIAGNOSIS — R262 Difficulty in walking, not elsewhere classified: Secondary | ICD-10-CM | POA: Diagnosis not present

## 2016-04-13 DIAGNOSIS — M25561 Pain in right knee: Secondary | ICD-10-CM | POA: Diagnosis not present

## 2016-04-13 DIAGNOSIS — M25551 Pain in right hip: Secondary | ICD-10-CM | POA: Diagnosis not present

## 2016-04-13 DIAGNOSIS — M6281 Muscle weakness (generalized): Secondary | ICD-10-CM | POA: Diagnosis not present

## 2016-04-13 DIAGNOSIS — Z9181 History of falling: Secondary | ICD-10-CM | POA: Diagnosis not present

## 2016-04-16 DIAGNOSIS — R262 Difficulty in walking, not elsewhere classified: Secondary | ICD-10-CM | POA: Diagnosis not present

## 2016-04-16 DIAGNOSIS — R296 Repeated falls: Secondary | ICD-10-CM | POA: Diagnosis not present

## 2016-04-16 DIAGNOSIS — M25551 Pain in right hip: Secondary | ICD-10-CM | POA: Diagnosis not present

## 2016-04-16 DIAGNOSIS — M6281 Muscle weakness (generalized): Secondary | ICD-10-CM | POA: Diagnosis not present

## 2016-04-16 DIAGNOSIS — M25561 Pain in right knee: Secondary | ICD-10-CM | POA: Diagnosis not present

## 2016-04-16 DIAGNOSIS — Z9181 History of falling: Secondary | ICD-10-CM | POA: Diagnosis not present

## 2016-04-17 DIAGNOSIS — R296 Repeated falls: Secondary | ICD-10-CM | POA: Diagnosis not present

## 2016-04-17 DIAGNOSIS — M6281 Muscle weakness (generalized): Secondary | ICD-10-CM | POA: Diagnosis not present

## 2016-04-17 DIAGNOSIS — M25561 Pain in right knee: Secondary | ICD-10-CM | POA: Diagnosis not present

## 2016-04-17 DIAGNOSIS — M25551 Pain in right hip: Secondary | ICD-10-CM | POA: Diagnosis not present

## 2016-04-17 DIAGNOSIS — R262 Difficulty in walking, not elsewhere classified: Secondary | ICD-10-CM | POA: Diagnosis not present

## 2016-04-17 DIAGNOSIS — Z9181 History of falling: Secondary | ICD-10-CM | POA: Diagnosis not present

## 2016-04-18 ENCOUNTER — Telehealth: Payer: Self-pay | Admitting: Cardiology

## 2016-04-18 NOTE — Telephone Encounter (Signed)
New message   Pt c/o swelling: STAT is pt has developed SOB within 24 hours  1. How long have you been experiencing swelling? Couple of days 2. Where is the swelling located? Knees down and both legs and one leg is painful 3.  Are you currently taking a "fluid pill"? yes  4.  Are you currently SOB? no  5.  Have you traveled recently? no

## 2016-04-18 NOTE — Telephone Encounter (Signed)
Spoke with pt son, the pt called and told him she had swelling in her feet and legs the last 2 days. She has doubled her furosemide the last 2 days and has seen no results. Her son reports she is not SOB. Left message for pt to call

## 2016-04-18 NOTE — Telephone Encounter (Signed)
Spoke with pt, she has had swelling in her feet and legs for 2 months by her report. She has taken furosemide 80 mg yesterday and today. She has seen much of a change, she does not weigh. She does report SOB at times and she has been sleeping in the recliner for the last 2 nights, she reports breathing fast when she lies flat. She wears her compression stockings daily. She reports trying to watch her salt but the food in the facility can be salty. She increased her fluid intake yesterday due to going to the bathroom a lot, fluid restrictions discussed. Pt offered appt today with APP but pt declined. She will take another 80 mg of furosemide tomorrow, if no change in symptoms or edema by Friday she will call so she can be seen. Pt son aware of the above and agrees.

## 2016-04-18 NOTE — Telephone Encounter (Signed)
Follow-up     The pt is returning [name]'s phone call

## 2016-04-19 DIAGNOSIS — Z9181 History of falling: Secondary | ICD-10-CM | POA: Diagnosis not present

## 2016-04-19 DIAGNOSIS — R262 Difficulty in walking, not elsewhere classified: Secondary | ICD-10-CM | POA: Diagnosis not present

## 2016-04-19 DIAGNOSIS — M6281 Muscle weakness (generalized): Secondary | ICD-10-CM | POA: Diagnosis not present

## 2016-04-19 DIAGNOSIS — M25551 Pain in right hip: Secondary | ICD-10-CM | POA: Diagnosis not present

## 2016-04-19 DIAGNOSIS — R296 Repeated falls: Secondary | ICD-10-CM | POA: Diagnosis not present

## 2016-04-19 DIAGNOSIS — M25561 Pain in right knee: Secondary | ICD-10-CM | POA: Diagnosis not present

## 2016-04-20 ENCOUNTER — Telehealth: Payer: Self-pay | Admitting: *Deleted

## 2016-04-20 ENCOUNTER — Encounter: Payer: Self-pay | Admitting: Cardiology

## 2016-04-20 ENCOUNTER — Ambulatory Visit: Payer: Medicare Other | Admitting: Cardiology

## 2016-04-20 ENCOUNTER — Ambulatory Visit (INDEPENDENT_AMBULATORY_CARE_PROVIDER_SITE_OTHER): Payer: Medicare Other | Admitting: Cardiology

## 2016-04-20 VITALS — BP 138/58 | HR 74 | Ht 59.0 in | Wt 173.6 lb

## 2016-04-20 DIAGNOSIS — I5032 Chronic diastolic (congestive) heart failure: Secondary | ICD-10-CM | POA: Diagnosis not present

## 2016-04-20 DIAGNOSIS — R600 Localized edema: Secondary | ICD-10-CM | POA: Diagnosis not present

## 2016-04-20 DIAGNOSIS — R0602 Shortness of breath: Secondary | ICD-10-CM | POA: Diagnosis not present

## 2016-04-20 DIAGNOSIS — I10 Essential (primary) hypertension: Secondary | ICD-10-CM

## 2016-04-20 LAB — BASIC METABOLIC PANEL
BUN: 29 mg/dL — ABNORMAL HIGH (ref 7–25)
CHLORIDE: 100 mmol/L (ref 98–110)
CO2: 29 mmol/L (ref 20–31)
Calcium: 9.4 mg/dL (ref 8.6–10.4)
Creat: 1.14 mg/dL — ABNORMAL HIGH (ref 0.60–0.88)
Glucose, Bld: 92 mg/dL (ref 65–99)
POTASSIUM: 4.5 mmol/L (ref 3.5–5.3)
SODIUM: 139 mmol/L (ref 135–146)

## 2016-04-20 MED ORDER — METOLAZONE 2.5 MG PO TABS: 2.5000 mg | ORAL_TABLET | ORAL | Status: DC

## 2016-04-20 NOTE — Telephone Encounter (Signed)
See telephone not from today.

## 2016-04-20 NOTE — Telephone Encounter (Signed)
Follow-up    Returning Kathy Hoover's call

## 2016-04-20 NOTE — Telephone Encounter (Signed)
Follow-up    The pt wants a 11:30 am for today. If possible

## 2016-04-20 NOTE — Telephone Encounter (Signed)
Spoke with pt, her edema has changed but only a little. She has SOB with little exertion and continues to have orthopnea. She requested to be seen. Follow up scheduled with flex today

## 2016-04-20 NOTE — Telephone Encounter (Signed)
This encounter was created in error - please disregard.

## 2016-04-20 NOTE — Progress Notes (Signed)
Cardiology Office Note   Date:  04/20/2016   ID:  Chetara Thaden, DOB May 04, 1916, MRN [id number]  PCP:  Anthoney Harada, MD  Cardiologist:  Dr. Stanford Breed     Chief Complaint  Patient presents with  . Edema    DOE      History of Present Illness: Kathy Hoover is a 82 y.o. female who presents for Edema, DOE, orthopnea.  Last seen by Dr. Stanford Breed 12/28/15 for her hx of diastolic HF and AS.  Echo 4/15 showed moderate AS with mean gradient 28 mmHg. Last echo 7/16 showed normal LV function, mild AS, moderate AI, mild MS with mean gradient 6 mmHg, moderately elevated pulmonary hypertension.  On that visit plan was not to pursue follow up echoes.  She is on home lasix and oxygen.   Now with increased edema, and has taken lasix 80 mg for 3 days (usual is 40 daily) with only minimal improvement.  She had one night that she had to sleep in the recliner for SOB, no chest pain.  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  No real DOE unless she pushes herself.  She does not use salty foods though lives with assisted living and eats food provided which contains salt.        Past Medical History  Diagnosis Date  . Lower extremity edema     CHRONIC  . Hypothyroidism   . Anxiety and depression   . Hypertension   . Moderate aortic stenosis 05/2012    by ECHO  . Cervical disc disease     W radiculopathy S/P epidural injections  . Vitamin D deficiency   . Macular degeneration     Dr Hillis Range Follows for pt.  . Anxiety   . Depression   . Exertional shortness of breath     "worse recently" (01/13/2014)  . Pneumonia     "once before today" (01/13/2014)  . Bacterial pneumonia 01/13/2014  . Chronic bronchitis ([place])     "@ least once a year;  in the winter" (01/13/2014)  . Breast cancer ([place])   . Arthritis     "terribly in my hands" (01/13/2014)  . Chronic diastolic CHF (congestive heart failure) (HCC)     Preserved EF    Past Surgical History  Procedure Laterality Date  .  Cataract extraction w/ intraocular lens  implant, bilateral Bilateral ~ 2008  . Tonsillectomy  1949  . Abdominal hysterectomy    . Appendectomy  1949  . Breast biopsy Right ~ 2005  . Breast lumpectomy Right ~ 2005     Current Outpatient Prescriptions  Medication Sig Dispense Refill  . acetaminophen (TYLENOL) 325 MG tablet Take 2 tablets (650 mg total) by mouth every 6 (six) hours as needed (pain or Fever >/= 101).    Marland Kitchen amLODipine (NORVASC) 2.5 MG tablet Take 1 tablet (2.5 mg total) by mouth daily. 90 tablet 3  . aspirin 81 MG EC tablet Take 1 tablet (81 mg total) by mouth every other day. 30 tablet 0  . Cholecalciferol 2000 units CAPS Take 2,000 Units by mouth every evening.    . furosemide (LASIX) 40 MG tablet Take 1 tablet (40 mg total) by mouth daily. 90 tablet 3  . levothyroxine (SYNTHROID, LEVOTHROID) 75 MCG tablet Take 1 tablet (75 mcg total) by mouth daily before breakfast. 90 tablet 3  . Multiple Vitamin (MULTIVITAMIN WITH MINERALS) TABS Take 1 tablet by mouth daily.    . Multiple Vitamins-Minerals (PRESERVISION AREDS  2 PO) Take 1 tablet by mouth as directed.    . nitroGLYCERIN (NITROSTAT) 0.4 MG SL tablet Place 1 tablet (0.4 mg total) under the tongue every 5 (five) minutes as needed for chest pain. 25 tablet 1  . potassium chloride (K-DUR) 10 MEQ tablet Take 1 tablet (10 mEq total) by mouth daily. 90 tablet 3  . Probiotic Product (SOLUBLE FIBER/PROBIOTICS PO) Take 1 tablet by mouth daily.    . vitamin C (ASCORBIC ACID) 500 MG tablet Take 500 mg by mouth daily.    . metolazone (ZAROXOLYN) 2.5 MG tablet Take 1 tablet (2.5 mg total) by mouth every other day. 20 tablet 0   No current facility-administered medications for this visit.    Allergies:   Ciprofloxacin; Codeine; Metronidazole; and Penicillins    Social History:  The patient  reports that she has never smoked. She has never used smokeless tobacco. She reports that she does not drink alcohol or use illicit drugs.    Family History:  The patient's family history includes CVA in her brother and father; Multiple sclerosis in her brother; Pneumonia in her father.    ROS:  General:no colds or fevers, no weight changes Skin:no rashes or ulcers HEENT:no blurred vision, no congestion CV:see HPI PUL:see HPI GI:no diarrhea , + constipation or melena, no indigestion GU:no hematuria, no dysuria MS:+ arthritic pain of knee and hip, no claudication Neuro:no syncope, no lightheadedness Endo:no diabetes, + thyroid disease  Wt Readings from Last 3 Encounters:  04/20/16 173 lb 9.6 oz (78.744 kg)  12/28/15 172 lb 1.9 oz (78.073 kg)  09/21/15 175 lb 12.8 oz (79.742 kg)     PHYSICAL EXAM: VS:  BP 138/58 mmHg  Pulse 74  Ht 4\' 11"  (1.499 m)  Wt 173 lb 9.6 oz (78.744 kg)  BMI 35.04 kg/m2 , BMI Body mass index is 35.04 kg/(m^2). General:Pleasant affect, NAD Skin:Warm and dry, brisk capillary refill HEENT:normocephalic, sclera clear, mucus membranes moist Neck:supple, no JVD sitting, no bruits  Heart:S1S2 RRR with 3/6 systolic murmur, no gallup, rub or click Lungs:clear without rales, rhonchi, or wheezes [id number], non tender, + BS, do not palpate liver spleen or masses Ext:2+ edema to knees and minimal in thighs, support stockings in place,  2+ radial pulses Neuro:alert and oriented X 3, MAE, follows commands, + facial symmetry    EKG:  EKG is NOT ordered today.    Recent Labs: 06/05/2015: ALT 26 06/07/2015: Magnesium 3.1* 12/03/2015: B Natriuretic Peptide 311.6*; BUN 19; Creatinine, Ser 1.20*; Hemoglobin 11.3*; Platelets PLATELET CLUMPS NOTED ON SMEAR; Potassium 3.8; Sodium 132*    Lipid Panel No results found for: CHOL, TRIG, HDL, CHOLHDL, VLDL, LDLCALC, LDLDIRECT     Other studies Reviewed: Additional studies/ records that were reviewed today include: .  ECHO:  Study Conclusions  - Left ventricle: The cavity size was mildly reduced. Wall  thickness was increased in a pattern of moderate  LVH. Systolic  function was normal. The estimated ejection fraction was in the  range of 55% to 60%. Wall motion was normal; there were no  regional wall motion abnormalities. - Aortic valve: Mildly to moderately calcified annulus. Moderately  thickened leaflets. There was very mild stenosis. There was  moderate regurgitation. Valve area (VTI): 2 cm^2. Valve area  (Vmax): 1.19 cm^2. Valve area (Vmean): 1.64 cm^2. - Mitral valve: Moderately to severely calcified annulus. Valve  area by continuity equation (using LVOT flow): 1.31 cm^2. - Pulmonary arteries: Systolic pressure was moderately increased.  PA peak pressure: 45 mm  Hg (S).  ASSESSMENT AND PLAN:  1.  Lower ext. Edema without acute HF.  Lasix 80 daily not helping will add zaroxolyn 2.5 mg every other day and maybe every third day depending on labs.  I have asked her to weigh daily.  She will let us know if her wt is decreasing.  If labs abnormal I did discuss bringing her into hospital for IV diuretics.   2. AS   mild AS, moderate AI, mild MS with mean gradient 6 mmHg, moderately elevated pulmonary hypertension.  Plan is not to pursue any follow up echo and pt agreed.    3. Chronic diastolic HF. See above.  4. HTN controlled.    Current medicines are reviewed with the patient today.  The patient Has no concerns regarding medicines.  The following changes have been made:  See above Labs/ tests ordered today include:see above  Disposition:   FU:  see above  Signed, Cecilie Kicks, NP  04/20/2016 2:49 PM    [facility] [place], [place], [address], [place] Phone: [phone]; Fax: [phone]

## 2016-04-20 NOTE — Patient Instructions (Addendum)
Medication Instructions:  Your physician has recommended you make the following change in your medication:  1.  START Metolazone 2.5 taking 1 tablet every other day   Labwork: TODAY:  BMET  Testing/Procedures: None ordered  Follow-Up: Your physician recommends that you schedule a follow-up appointment in:   2 WEEKS WITH DR. [name] IN THE [place] OFF[facility] IF POSSIBLE   Any Other Special Instructions Will Be Listed Below (If Applicable).     If you need a refill on your cardiac medications before your next appointment, please call your pharmacy.

## 2016-04-23 ENCOUNTER — Telehealth: Payer: Self-pay | Admitting: Cardiology

## 2016-04-23 ENCOUNTER — Telehealth: Payer: Self-pay | Admitting: *Deleted

## 2016-04-23 DIAGNOSIS — M6281 Muscle weakness (generalized): Secondary | ICD-10-CM | POA: Diagnosis not present

## 2016-04-23 DIAGNOSIS — R296 Repeated falls: Secondary | ICD-10-CM | POA: Diagnosis not present

## 2016-04-23 DIAGNOSIS — M25551 Pain in right hip: Secondary | ICD-10-CM | POA: Diagnosis not present

## 2016-04-23 DIAGNOSIS — M25561 Pain in right knee: Secondary | ICD-10-CM | POA: Diagnosis not present

## 2016-04-23 DIAGNOSIS — R799 Abnormal finding of blood chemistry, unspecified: Secondary | ICD-10-CM

## 2016-04-23 DIAGNOSIS — R262 Difficulty in walking, not elsewhere classified: Secondary | ICD-10-CM | POA: Diagnosis not present

## 2016-04-23 DIAGNOSIS — Z9181 History of falling: Secondary | ICD-10-CM | POA: Diagnosis not present

## 2016-04-23 NOTE — Telephone Encounter (Signed)
-----   Message from Isaiah Serge, NP sent at 04/21/2016  1:56 PM EDT ----- Labs stable.  Continue medication as ordered.  Is she losing some fluid?  Repeat labs Thursday.

## 2016-04-23 NOTE — Telephone Encounter (Signed)
Informed pt of lab results. Pt verbalized understanding. 

## 2016-04-23 NOTE — Telephone Encounter (Signed)
New Message  Pt called states that she was in office on Friday and she is not clear on the dosages. How and when to take the medication metolazone (ZAROXOLYN) 2.5 MG tablet

## 2016-04-23 NOTE — Telephone Encounter (Signed)
Returned call to pt.  She wanted to make sure on how to take the metolazone. Her pharmacy said to take it 30 min prior to the furosemide. I confirmed with her that was correct.  She then asked it the metolazone could cause her to have loose/soft stool. She experienced a soft, not diarrhea stool after taking metolazone the first time. She has not had it since and it was a one time occurrence. I told her to see if it happens again this week in correlation with the medication. She denies dark, tarry stools or blood in stool and stated, "it was not diarrhea." Advised her to call back if she had ongoing questions or problems. She verbalized understanding.

## 2016-04-24 DIAGNOSIS — I10 Essential (primary) hypertension: Secondary | ICD-10-CM | POA: Diagnosis not present

## 2016-04-24 DIAGNOSIS — M7071 Other bursitis of hip, right hip: Secondary | ICD-10-CM | POA: Diagnosis not present

## 2016-04-24 DIAGNOSIS — M1711 Unilateral primary osteoarthritis, right knee: Secondary | ICD-10-CM | POA: Diagnosis not present

## 2016-04-24 DIAGNOSIS — F329 Major depressive disorder, single episode, unspecified: Secondary | ICD-10-CM | POA: Diagnosis not present

## 2016-04-24 DIAGNOSIS — I350 Nonrheumatic aortic (valve) stenosis: Secondary | ICD-10-CM | POA: Diagnosis not present

## 2016-04-24 DIAGNOSIS — E039 Hypothyroidism, unspecified: Secondary | ICD-10-CM | POA: Diagnosis not present

## 2016-04-24 DIAGNOSIS — I5030 Unspecified diastolic (congestive) heart failure: Secondary | ICD-10-CM | POA: Diagnosis not present

## 2016-04-25 ENCOUNTER — Other Ambulatory Visit (INDEPENDENT_AMBULATORY_CARE_PROVIDER_SITE_OTHER): Payer: Medicare Other

## 2016-04-25 DIAGNOSIS — R799 Abnormal finding of blood chemistry, unspecified: Secondary | ICD-10-CM | POA: Diagnosis not present

## 2016-04-25 LAB — BASIC METABOLIC PANEL
BUN: 33 mg/dL — AB (ref 7–25)
CALCIUM: 9.5 mg/dL (ref 8.6–10.4)
CO2: 30 mmol/L (ref 20–31)
CREATININE: 1.16 mg/dL — AB (ref 0.60–0.88)
Chloride: 95 mmol/L — ABNORMAL LOW (ref 98–110)
Glucose, Bld: 93 mg/dL (ref 65–99)
Potassium: 3.8 mmol/L (ref 3.5–5.3)
Sodium: 137 mmol/L (ref 135–146)

## 2016-04-26 ENCOUNTER — Other Ambulatory Visit: Payer: Medicare Other

## 2016-04-26 ENCOUNTER — Telehealth: Payer: Self-pay | Admitting: Cardiology

## 2016-04-26 DIAGNOSIS — M25551 Pain in right hip: Secondary | ICD-10-CM | POA: Diagnosis not present

## 2016-04-26 DIAGNOSIS — Z9181 History of falling: Secondary | ICD-10-CM | POA: Diagnosis not present

## 2016-04-26 DIAGNOSIS — R296 Repeated falls: Secondary | ICD-10-CM | POA: Diagnosis not present

## 2016-04-26 DIAGNOSIS — M6281 Muscle weakness (generalized): Secondary | ICD-10-CM | POA: Diagnosis not present

## 2016-04-26 DIAGNOSIS — R262 Difficulty in walking, not elsewhere classified: Secondary | ICD-10-CM | POA: Diagnosis not present

## 2016-04-26 DIAGNOSIS — M25561 Pain in right knee: Secondary | ICD-10-CM | POA: Diagnosis not present

## 2016-04-26 NOTE — Telephone Encounter (Signed)
Fu  Pt returning RN phone call- lab results. Please call back and discuss.   

## 2016-04-26 NOTE — Telephone Encounter (Signed)
Returned pts call and discussed lab results. Pt verbalized understanding and appreciation.

## 2016-04-27 ENCOUNTER — Telehealth: Payer: Self-pay | Admitting: Cardiology

## 2016-04-27 NOTE — Telephone Encounter (Signed)
Returned call to patient with MD advice. She states she is feeling a little better and was able to eat some oatmeal and drink coffee. She voiced understanding of plan to hold metolazone until N/V resolves. She will monitor weight and call if weights increase. Confirmed appointment w/patient 05/08/16

## 2016-04-27 NOTE — Telephone Encounter (Signed)
I don't think the metolazone is the cause of the nausea and vomiting, but I do recommend stopping it until the N/V have resolved. Keep monitoring weight and let us know if it is going up

## 2016-04-27 NOTE — Telephone Encounter (Signed)
New Message  Pt c/o medication issue: 1. Name of Medication: metolazone (ZAROXOLYN) 2.5 MG tablet  4. What is your medication issue? Pt is requesting to adjust or stop taking this medication. Symptoms : Dry heaves and sick on her stomach per pt.

## 2016-04-27 NOTE — Telephone Encounter (Signed)
Returned call to patient. She states she is unable to tell what is making her feel bad - metolazone/diuertics or antibiotic from Wednesday  She went to the dentist on Wednesday - had to take antibiotic prior (clindamycin 500mg  x4 capsules) - this made her sick on her stomach She has not felt well since Wednesday AM - ate breakfast yesterday, took pepto bismol yesterday and threw this up (she has done this in the past w/this OTC) She did not go to supper last night - she wasn't feeling well Just before she went to bed last night, she had ginger ale Woke up this AM w/dry heaves w/water that she threw up She had liquid stools this AM She was able to eat a little oatmeal this AM She is not nauseous right now - she has not taken medications UOP is good - she has been drinking water to stay hydrated  Patient is Rx'ed metolazone QOD and is supposed to take lasix 80mg  today - has not yet taken this medication  Patient weights herself daily - 166lbs (about 1 week ago) - 162lbs today She previously had SOB prior to seeing L. Dorene Ar, NP on 6/9 but is doing much better  Informed patient I would need to consult with provider for recommendations/clinical pharmacy staff regarding SE of metolazone(?) She agreed w/plan She does get SOB with exertiobn

## 2016-05-07 DIAGNOSIS — M6281 Muscle weakness (generalized): Secondary | ICD-10-CM | POA: Diagnosis not present

## 2016-05-07 DIAGNOSIS — M25561 Pain in right knee: Secondary | ICD-10-CM | POA: Diagnosis not present

## 2016-05-07 DIAGNOSIS — M25551 Pain in right hip: Secondary | ICD-10-CM | POA: Diagnosis not present

## 2016-05-07 DIAGNOSIS — R262 Difficulty in walking, not elsewhere classified: Secondary | ICD-10-CM | POA: Diagnosis not present

## 2016-05-07 DIAGNOSIS — Z9181 History of falling: Secondary | ICD-10-CM | POA: Diagnosis not present

## 2016-05-07 DIAGNOSIS — R296 Repeated falls: Secondary | ICD-10-CM | POA: Diagnosis not present

## 2016-05-08 ENCOUNTER — Ambulatory Visit: Payer: Medicare Other | Admitting: Nurse Practitioner

## 2016-05-09 ENCOUNTER — Encounter: Payer: Self-pay | Admitting: *Deleted

## 2016-05-09 DIAGNOSIS — M25551 Pain in right hip: Secondary | ICD-10-CM | POA: Diagnosis not present

## 2016-05-09 DIAGNOSIS — R296 Repeated falls: Secondary | ICD-10-CM | POA: Diagnosis not present

## 2016-05-09 DIAGNOSIS — Z9181 History of falling: Secondary | ICD-10-CM | POA: Diagnosis not present

## 2016-05-09 DIAGNOSIS — R262 Difficulty in walking, not elsewhere classified: Secondary | ICD-10-CM | POA: Diagnosis not present

## 2016-05-09 DIAGNOSIS — M6281 Muscle weakness (generalized): Secondary | ICD-10-CM | POA: Diagnosis not present

## 2016-05-09 DIAGNOSIS — M25561 Pain in right knee: Secondary | ICD-10-CM | POA: Diagnosis not present

## 2016-05-10 DIAGNOSIS — M6281 Muscle weakness (generalized): Secondary | ICD-10-CM | POA: Diagnosis not present

## 2016-05-10 DIAGNOSIS — M25551 Pain in right hip: Secondary | ICD-10-CM | POA: Diagnosis not present

## 2016-05-10 DIAGNOSIS — M25561 Pain in right knee: Secondary | ICD-10-CM | POA: Diagnosis not present

## 2016-05-10 DIAGNOSIS — R296 Repeated falls: Secondary | ICD-10-CM | POA: Diagnosis not present

## 2016-05-10 DIAGNOSIS — R262 Difficulty in walking, not elsewhere classified: Secondary | ICD-10-CM | POA: Diagnosis not present

## 2016-05-10 DIAGNOSIS — Z9181 History of falling: Secondary | ICD-10-CM | POA: Diagnosis not present

## 2016-05-14 DIAGNOSIS — R296 Repeated falls: Secondary | ICD-10-CM | POA: Diagnosis not present

## 2016-05-14 DIAGNOSIS — M25551 Pain in right hip: Secondary | ICD-10-CM | POA: Diagnosis not present

## 2016-05-14 DIAGNOSIS — R262 Difficulty in walking, not elsewhere classified: Secondary | ICD-10-CM | POA: Diagnosis not present

## 2016-05-14 DIAGNOSIS — Z9181 History of falling: Secondary | ICD-10-CM | POA: Diagnosis not present

## 2016-05-14 DIAGNOSIS — M6281 Muscle weakness (generalized): Secondary | ICD-10-CM | POA: Diagnosis not present

## 2016-05-14 DIAGNOSIS — M25512 Pain in left shoulder: Secondary | ICD-10-CM | POA: Diagnosis not present

## 2016-05-14 DIAGNOSIS — M25561 Pain in right knee: Secondary | ICD-10-CM | POA: Diagnosis not present

## 2016-05-14 DIAGNOSIS — M545 Low back pain: Secondary | ICD-10-CM | POA: Diagnosis not present

## 2016-05-15 NOTE — Progress Notes (Signed)
Cardiology Office Note   Date:  05/16/2016   ID:  Moises Kata, DOB [birth year], MRN [id number]  PCP:  Anthoney Harada, MD  Cardiologist:  Dr. Stanford Breed    Chief Complaint  Patient presents with  . Leg Swelling      History of Present Illness: Kathy Hoover is a 82 y.o. female who presents for follow up for edema and DOE.   Last seen by Dr. Stanford Breed 12/28/15 for her hx of diastolic HF and AS. Echo 4/15 showed moderate AS with mean gradient 28 mmHg. Last echo 7/16 showed normal LV function, mild AS, moderate AI, mild MS with mean gradient 6 mmHg, moderately elevated pulmonary hypertension. On that visit plan was not to pursue follow up echoes. She is on home lasix and oxygen.   I saw her 04/20/16 with increased edema and had taken lasix 80 mg for 3 days (usual is 40 daily) with only minimal improvement. She had one night that she had to sleep in the recliner for SOB, no chest pain. 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 No real DOE unless she pushes herself. She does not use salty foods though lives with assisted living and eats food provided which contains salt. We added zaroxolyn 2.5 mg every other day.  She did not make her last follow up appt.    Today she is better.  She was only to tolerate 2 doses of metolazone.  Stated it made her sick with stools, and then ABX for dental work and she had diarrhea.  But she feels better and edema is back to baseline.  No chest pain.  Past Medical History  Diagnosis Date  . Lower extremity edema     CHRONIC  . Hypothyroidism   . Anxiety and depression   . Hypertension   . Moderate aortic stenosis 05/2012    by ECHO  . Cervical disc disease     W radiculopathy S/P epidural injections  . Vitamin D deficiency   . Macular degeneration     Dr Hillis Range Follows for pt.  . Anxiety   . Depression   . Exertional shortness of breath     "worse recently" (01/13/2014)  . Pneumonia     "once before today" (01/13/2014)    . Bacterial pneumonia 01/13/2014  . Chronic bronchitis ([facility])     "@ least once a year;  in the winter" (01/13/2014)  . Breast cancer ([facility])   . Arthritis     "terribly in my hands" (01/13/2014)  . Chronic diastolic CHF (congestive heart failure) ([facility])     Preserved EF    Past Surgical History  Procedure Laterality Date  . Cataract extraction w/ intraocular lens  implant, bilateral Bilateral ~ 2008  . Tonsillectomy  1949  . Abdominal hysterectomy    . Appendectomy  1949  . Breast biopsy Right ~ 2005  . Breast lumpectomy Right ~ 2005     Current Outpatient Prescriptions  Medication Sig Dispense Refill  . acetaminophen (TYLENOL) 325 MG tablet Take 2 tablets (650 mg total) by mouth every 6 (six) hours as needed (pain or Fever >/= 101).    Marland Kitchen amLODipine (NORVASC) 2.5 MG tablet Take 1 tablet (2.5 mg total) by mouth daily. 90 tablet 3  . aspirin 81 MG EC tablet Take 1 tablet (81 mg total) by mouth every other day. 30 tablet 0  . Cholecalciferol 2000 units CAPS Take 2,000 Units by mouth every evening.    . furosemide (LASIX)  40 MG tablet Take 1 tablet (40 mg total) by mouth daily. 90 tablet 3  . levothyroxine (SYNTHROID, LEVOTHROID) 75 MCG tablet Take 1 tablet (75 mcg total) by mouth daily before breakfast. 90 tablet 3  . metolazone (ZAROXOLYN) 2.5 MG tablet Take 1 tablet (2.5 mg total) by mouth every other day. 20 tablet 0  . Multiple Vitamin (MULTIVITAMIN WITH MINERALS) TABS Take 1 tablet by mouth as needed (vitamin supplement).     . Multiple Vitamins-Minerals (PRESERVISION AREDS 2 PO) Take 1 tablet by mouth as directed.    . nitroGLYCERIN (NITROSTAT) 0.4 MG SL tablet Place 1 tablet (0.4 mg total) under the tongue every 5 (five) minutes as needed for chest pain. 25 tablet 1  . potassium chloride (K-DUR) 10 MEQ tablet Take 1 tablet (10 mEq total) by mouth daily. 90 tablet 3  . Probiotic Product (SOLUBLE FIBER/PROBIOTICS PO) Take 1 tablet by mouth daily.    . vitamin C (ASCORBIC ACID) 500 MG  tablet Take 500 mg by mouth daily.     No current facility-administered medications for this visit.    Allergies:   Ciprofloxacin; Codeine; Metronidazole; and Penicillins    Social History:  The patient  reports that she has never smoked. She has never used smokeless tobacco. She reports that she does not drink alcohol or use illicit drugs.   Family History:  The patient's family history includes CVA in her brother and father; Multiple sclerosis in her brother; Pneumonia in her father.    ROS:  General:no colds or fevers, no weight changes Skin:no rashes or ulcers HEENT:no blurred vision, no congestion CV:see HPI PUL:see HPI GI:no diarrhea  It has now resolved.  No constipation or melena, no indigestion GU:no hematuria, no dysuria MS:no joint pain, no claudication Neuro:no syncope, no lightheadedness Endo:no diabetes, no thyroid disease  Wt Readings from Last 3 Encounters:  04/20/16 173 lb 9.6 oz (78.744 kg)  12/28/15 172 lb 1.9 oz (78.073 kg)  09/21/15 175 lb 12.8 oz (79.742 kg)     PHYSICAL EXAM: VS:  BP 130/60 mmHg  Pulse 72  Ht 4\' 11"  (1.499 m)  SpO2 91% , BMI There is no weight on file to calculate BMI. General:Pleasant affect, NAD Heart:S1S2 RRR without murmur, gallup, rub or click Lungs:clear without rales, rhonchi, or wheezes [id number], non tender, + BS, do not palpate liver spleen or masses [id number] lower ext edema, , 2+ radial pulses Neuro:alert and oriented, MAE, follows commands, + facial symmetry    EKG:  EKG is NOT ordered today.    Recent Labs: 06/05/2015: ALT 26 06/07/2015: Magnesium 3.1* 12/03/2015: B Natriuretic Peptide 311.6*; Hemoglobin 11.3*; Platelets PLATELET CLUMPS NOTED ON SMEAR 04/25/2016: BUN 33*; Creat 1.16*; Potassium 3.8; Sodium 137    Lipid Panel No results found for: CHOL, TRIG, HDL, CHOLHDL, VLDL, LDLCALC, LDLDIRECT     Other studies Reviewed: Additional studies/ records that were reviewed today include: reviewed previous notes.  .   ASSESSMENT AND PLAN:  1. Lower ext. Edema improved, without acute HF. Lasix 80 daily not helping with addition of  zaroxolyn 2.5 mg every other day was only able to take 2 doses due to diarrhea.. I have asked her to weigh daily. She will let us know if her wt is decreasing.she feels beter.   2. AS mild AS, moderate AI, mild MS with mean gradient 6 mmHg, moderately elevated pulmonary hypertension. Plan is not to pursue any follow up echo and pt agreed.   3. Chronic diastolic HF. See above.  4. HTN controlled.   Follow up with Dr. Stanford Breed in 2 months.  Current medicines are reviewed with the patient today.  The patient Has no concerns regarding medicines.  The following changes have been made:  See above Labs/ tests ordered today include:see above  Disposition:   FU:  see above  Signed, Cecilie Kicks, NP  05/16/2016 9:50 AM    [facility], [place], [place], [place] Phone: [phone]; Fax: [phone]

## 2016-05-16 ENCOUNTER — Encounter (INDEPENDENT_AMBULATORY_CARE_PROVIDER_SITE_OTHER): Payer: Self-pay

## 2016-05-16 ENCOUNTER — Ambulatory Visit (INDEPENDENT_AMBULATORY_CARE_PROVIDER_SITE_OTHER): Payer: Medicare Other | Admitting: Cardiology

## 2016-05-16 ENCOUNTER — Encounter: Payer: Self-pay | Admitting: Cardiology

## 2016-05-16 VITALS — BP 130/60 | HR 72 | Ht 59.0 in | Wt 168.0 lb

## 2016-05-16 DIAGNOSIS — R600 Localized edema: Secondary | ICD-10-CM | POA: Diagnosis not present

## 2016-05-16 DIAGNOSIS — I5032 Chronic diastolic (congestive) heart failure: Secondary | ICD-10-CM | POA: Diagnosis not present

## 2016-05-16 DIAGNOSIS — I10 Essential (primary) hypertension: Secondary | ICD-10-CM

## 2016-05-16 NOTE — Patient Instructions (Signed)
Medication Instructions:  Your physician recommends that you continue on your current medications as directed. Please refer to the Current Medication list given to you today.   Labwork: NONE  Testing/Procedures: NONE  Follow-Up: DR. Stanford Breed IN 2 MONTHS AT THE [facility]  Any Other Special Instructions Will Be Listed Below (If Applicable).     If you need a refill on your cardiac medications before your next appointment, please call your pharmacy.

## 2016-05-17 ENCOUNTER — Ambulatory Visit: Payer: Medicare Other | Admitting: Cardiology

## 2016-05-17 DIAGNOSIS — R262 Difficulty in walking, not elsewhere classified: Secondary | ICD-10-CM | POA: Diagnosis not present

## 2016-05-17 DIAGNOSIS — M25551 Pain in right hip: Secondary | ICD-10-CM | POA: Diagnosis not present

## 2016-05-17 DIAGNOSIS — M6281 Muscle weakness (generalized): Secondary | ICD-10-CM | POA: Diagnosis not present

## 2016-05-17 DIAGNOSIS — M25512 Pain in left shoulder: Secondary | ICD-10-CM | POA: Diagnosis not present

## 2016-05-17 DIAGNOSIS — M545 Low back pain: Secondary | ICD-10-CM | POA: Diagnosis not present

## 2016-05-17 DIAGNOSIS — M25561 Pain in right knee: Secondary | ICD-10-CM | POA: Diagnosis not present

## 2016-05-18 DIAGNOSIS — M6281 Muscle weakness (generalized): Secondary | ICD-10-CM | POA: Diagnosis not present

## 2016-05-18 DIAGNOSIS — M25561 Pain in right knee: Secondary | ICD-10-CM | POA: Diagnosis not present

## 2016-05-18 DIAGNOSIS — M25512 Pain in left shoulder: Secondary | ICD-10-CM | POA: Diagnosis not present

## 2016-05-18 DIAGNOSIS — R262 Difficulty in walking, not elsewhere classified: Secondary | ICD-10-CM | POA: Diagnosis not present

## 2016-05-18 DIAGNOSIS — M25551 Pain in right hip: Secondary | ICD-10-CM | POA: Diagnosis not present

## 2016-05-18 DIAGNOSIS — M545 Low back pain: Secondary | ICD-10-CM | POA: Diagnosis not present

## 2016-05-22 DIAGNOSIS — F329 Major depressive disorder, single episode, unspecified: Secondary | ICD-10-CM | POA: Diagnosis not present

## 2016-05-22 DIAGNOSIS — I350 Nonrheumatic aortic (valve) stenosis: Secondary | ICD-10-CM | POA: Diagnosis not present

## 2016-05-22 DIAGNOSIS — M7071 Other bursitis of hip, right hip: Secondary | ICD-10-CM | POA: Diagnosis not present

## 2016-05-22 DIAGNOSIS — I5030 Unspecified diastolic (congestive) heart failure: Secondary | ICD-10-CM | POA: Diagnosis not present

## 2016-05-22 DIAGNOSIS — E039 Hypothyroidism, unspecified: Secondary | ICD-10-CM | POA: Diagnosis not present

## 2016-05-22 DIAGNOSIS — I10 Essential (primary) hypertension: Secondary | ICD-10-CM | POA: Diagnosis not present

## 2016-05-22 DIAGNOSIS — M1711 Unilateral primary osteoarthritis, right knee: Secondary | ICD-10-CM | POA: Diagnosis not present

## 2016-05-23 DIAGNOSIS — M25512 Pain in left shoulder: Secondary | ICD-10-CM | POA: Diagnosis not present

## 2016-05-23 DIAGNOSIS — M25551 Pain in right hip: Secondary | ICD-10-CM | POA: Diagnosis not present

## 2016-05-23 DIAGNOSIS — M6281 Muscle weakness (generalized): Secondary | ICD-10-CM | POA: Diagnosis not present

## 2016-05-23 DIAGNOSIS — R262 Difficulty in walking, not elsewhere classified: Secondary | ICD-10-CM | POA: Diagnosis not present

## 2016-05-23 DIAGNOSIS — M25561 Pain in right knee: Secondary | ICD-10-CM | POA: Diagnosis not present

## 2016-05-23 DIAGNOSIS — M545 Low back pain: Secondary | ICD-10-CM | POA: Diagnosis not present

## 2016-05-24 DIAGNOSIS — R262 Difficulty in walking, not elsewhere classified: Secondary | ICD-10-CM | POA: Diagnosis not present

## 2016-05-24 DIAGNOSIS — M25512 Pain in left shoulder: Secondary | ICD-10-CM | POA: Diagnosis not present

## 2016-05-24 DIAGNOSIS — M25551 Pain in right hip: Secondary | ICD-10-CM | POA: Diagnosis not present

## 2016-05-24 DIAGNOSIS — M545 Low back pain: Secondary | ICD-10-CM | POA: Diagnosis not present

## 2016-05-24 DIAGNOSIS — M25561 Pain in right knee: Secondary | ICD-10-CM | POA: Diagnosis not present

## 2016-05-24 DIAGNOSIS — M6281 Muscle weakness (generalized): Secondary | ICD-10-CM | POA: Diagnosis not present

## 2016-05-28 DIAGNOSIS — M25512 Pain in left shoulder: Secondary | ICD-10-CM | POA: Diagnosis not present

## 2016-05-28 DIAGNOSIS — M6281 Muscle weakness (generalized): Secondary | ICD-10-CM | POA: Diagnosis not present

## 2016-05-28 DIAGNOSIS — M25551 Pain in right hip: Secondary | ICD-10-CM | POA: Diagnosis not present

## 2016-05-28 DIAGNOSIS — R262 Difficulty in walking, not elsewhere classified: Secondary | ICD-10-CM | POA: Diagnosis not present

## 2016-05-28 DIAGNOSIS — M25561 Pain in right knee: Secondary | ICD-10-CM | POA: Diagnosis not present

## 2016-05-28 DIAGNOSIS — M545 Low back pain: Secondary | ICD-10-CM | POA: Diagnosis not present

## 2016-05-29 ENCOUNTER — Encounter (HOSPITAL_BASED_OUTPATIENT_CLINIC_OR_DEPARTMENT_OTHER): Payer: Self-pay | Admitting: *Deleted

## 2016-05-29 ENCOUNTER — Emergency Department (HOSPITAL_BASED_OUTPATIENT_CLINIC_OR_DEPARTMENT_OTHER): Payer: Medicare Other

## 2016-05-29 ENCOUNTER — Emergency Department (HOSPITAL_BASED_OUTPATIENT_CLINIC_OR_DEPARTMENT_OTHER)
Admission: EM | Admit: 2016-05-29 | Discharge: 2016-05-29 | Disposition: A | Discharge status: Home / Self Care | Payer: Medicare Other | Attending: Emergency Medicine | Admitting: Emergency Medicine

## 2016-05-29 DIAGNOSIS — Y999 Unspecified external cause status: Secondary | ICD-10-CM | POA: Insufficient documentation

## 2016-05-29 DIAGNOSIS — E039 Hypothyroidism, unspecified: Secondary | ICD-10-CM | POA: Diagnosis not present

## 2016-05-29 DIAGNOSIS — Y9389 Activity, other specified: Secondary | ICD-10-CM | POA: Diagnosis not present

## 2016-05-29 DIAGNOSIS — M545 Low back pain: Secondary | ICD-10-CM | POA: Diagnosis not present

## 2016-05-29 DIAGNOSIS — Y929 Unspecified place or not applicable: Secondary | ICD-10-CM | POA: Diagnosis not present

## 2016-05-29 DIAGNOSIS — W1839XA Other fall on same level, initial encounter: Secondary | ICD-10-CM | POA: Diagnosis not present

## 2016-05-29 DIAGNOSIS — I110 Hypertensive heart disease with heart failure: Secondary | ICD-10-CM | POA: Diagnosis not present

## 2016-05-29 DIAGNOSIS — S0990XA Unspecified injury of head, initial encounter: Secondary | ICD-10-CM | POA: Insufficient documentation

## 2016-05-29 DIAGNOSIS — I5032 Chronic diastolic (congestive) heart failure: Secondary | ICD-10-CM | POA: Diagnosis not present

## 2016-05-29 DIAGNOSIS — M549 Dorsalgia, unspecified: Secondary | ICD-10-CM

## 2016-05-29 DIAGNOSIS — R259 Unspecified abnormal involuntary movements: Secondary | ICD-10-CM | POA: Diagnosis not present

## 2016-05-29 DIAGNOSIS — F329 Major depressive disorder, single episode, unspecified: Secondary | ICD-10-CM | POA: Diagnosis not present

## 2016-05-29 DIAGNOSIS — S3992XA Unspecified injury of lower back, initial encounter: Secondary | ICD-10-CM | POA: Diagnosis not present

## 2016-05-29 DIAGNOSIS — M1990 Unspecified osteoarthritis, unspecified site: Secondary | ICD-10-CM | POA: Diagnosis not present

## 2016-05-29 DIAGNOSIS — S199XXA Unspecified injury of neck, initial encounter: Secondary | ICD-10-CM | POA: Diagnosis not present

## 2016-05-29 DIAGNOSIS — S299XXA Unspecified injury of thorax, initial encounter: Secondary | ICD-10-CM | POA: Diagnosis not present

## 2016-05-29 DIAGNOSIS — M546 Pain in thoracic spine: Secondary | ICD-10-CM | POA: Diagnosis not present

## 2016-05-29 DIAGNOSIS — W19XXXA Unspecified fall, initial encounter: Secondary | ICD-10-CM

## 2016-05-29 MED ORDER — ACETAMINOPHEN 500 MG PO TABS
1000.0000 mg | ORAL_TABLET | Freq: Once | ORAL | Status: AC
  Administered 2016-05-29: 1000 mg via ORAL
  Filled 2016-05-29: qty 2

## 2016-05-29 MED ORDER — LIDOCAINE 5 % EX PTCH: 1.0000 | MEDICATED_PATCH | CUTANEOUS | Status: DC

## 2016-05-29 MED FILL — LIDOCAINE 5% PATCH: 5 | 15 days supply | Qty: 15 | Fill #0

## 2016-05-29 NOTE — ED Provider Notes (Signed)
CSN: [id number]     Arrival date & time 05/29/16  1315 History   First MD Initiated Contact with Patient 05/29/16 1352     Chief Complaint  Patient presents with  . Fall     (Consider location/radiation/quality/duration/timing/severity/associated sxs/prior Treatment) HPI Comments: 82 y.o. Female with history of anxiety, macular degeneration, chronic bronchitis, CHF presents for evaluation following a fall.  The patient states that there is a spring door at her independent living facility that sticks and she always has to walk backwards through to get through it.  She states that she was doing just that but the door caught stronger than usual and she ended up falling backwards.  She states that she fell straight back onto her fanny, then her back and then her head.  She denies LOC.  She is not on anticoagulants.  She reports pain in the center of her back but otherwise feels well.  Denies chest pain or shortness of breath.   Past Medical History  Diagnosis Date  . Lower extremity edema     CHRONIC  . Hypothyroidism   . Anxiety and depression   . Hypertension   . Moderate aortic stenosis 05/2012    by ECHO  . Cervical disc disease     W radiculopathy S/P epidural injections  . Vitamin D deficiency   . Macular degeneration     Dr Hillis Range Follows for pt.  . Anxiety   . Depression   . Exertional shortness of breath     "worse recently" (01/13/2014)  . Pneumonia     "once before today" (01/13/2014)  . Bacterial pneumonia 01/13/2014  . Chronic bronchitis ([place])     "@ least once a year;  in the winter" (01/13/2014)  . Breast cancer ([place])   . Arthritis     "terribly in my hands" (01/13/2014)  . Chronic diastolic CHF (congestive heart failure) ([facility])     Preserved EF   Past Surgical History  Procedure Laterality Date  . Cataract extraction w/ intraocular lens  implant, bilateral Bilateral ~ 2008  . Tonsillectomy  1949  . Abdominal hysterectomy    . Appendectomy  1949  . Breast biopsy  Right ~ 2005  . Breast lumpectomy Right ~ 2005   Family History  Problem Relation Age of Onset  . CVA Father   . Pneumonia Father   . Multiple sclerosis Brother   . CVA Brother    Social History  Substance Use Topics  . Smoking status: Never Smoker   . Smokeless tobacco: Never Used  . Alcohol Use: No   OB History    No data available     Review of Systems  Constitutional: Negative for fever, appetite change and fatigue.  HENT: Negative for congestion, nosebleeds and postnasal drip.   Eyes: Negative for visual disturbance.  Respiratory: Negative for cough, chest tightness and shortness of breath.   Gastrointestinal: Negative for nausea, vomiting and abdominal pain.  Genitourinary: Negative for flank pain.  Musculoskeletal: Positive for back pain. Negative for gait problem and neck pain.  Skin: Negative for wound.  Neurological: Negative for dizziness, seizures, syncope, speech difficulty, weakness, light-headedness, numbness and headaches.  Hematological: Does not bruise/bleed easily.      Allergies  Ciprofloxacin; Codeine; Metronidazole; and Penicillins  Home Medications   Prior to Admission medications   Medication Sig Start Date End Date Taking? Authorizing Provider  acetaminophen (TYLENOL) 325 MG tablet Take 2 tablets (650 mg total) by mouth every 6 (six) hours as  needed (pain or Fever >/= 101). 01/18/14   Modena Jansky, MD  amLODipine (NORVASC) 2.5 MG tablet Take 1 tablet (2.5 mg total) by mouth daily. 12/28/15   Lelon Perla, MD  aspirin 81 MG EC tablet Take 1 tablet (81 mg total) by mouth every other day. 06/28/15   Sueanne Margarita, MD  Cholecalciferol 2000 units CAPS Take 2,000 Units by mouth every evening.    Historical Provider, MD  furosemide (LASIX) 40 MG tablet Take 1 tablet (40 mg total) by mouth daily. 12/28/15   Lelon Perla, MD  levothyroxine (SYNTHROID, LEVOTHROID) 75 MCG tablet Take 1 tablet (75 mcg total) by mouth daily before breakfast. 12/28/15    Lelon Perla, MD  metolazone (ZAROXOLYN) 2.5 MG tablet Take 1 tablet (2.5 mg total) by mouth every other day. 04/20/16   Isaiah Serge, NP  Multiple Vitamin (MULTIVITAMIN WITH MINERALS) TABS Take 1 tablet by mouth as needed (vitamin supplement).     Historical Provider, MD  Multiple Vitamins-Minerals (PRESERVISION AREDS 2 PO) Take 1 tablet by mouth as directed.    Historical Provider, MD  nitroGLYCERIN (NITROSTAT) 0.4 MG SL tablet Place 1 tablet (0.4 mg total) under the tongue every 5 (five) minutes as needed for chest pain. 06/13/15   Sueanne Margarita, MD  potassium chloride (K-DUR) 10 MEQ tablet Take 1 tablet (10 mEq total) by mouth daily. 12/28/15   Lelon Perla, MD  Probiotic Product (SOLUBLE FIBER/PROBIOTICS PO) Take 1 tablet by mouth daily.    Historical Provider, MD  vitamin C (ASCORBIC ACID) 500 MG tablet Take 500 mg by mouth daily.    Historical Provider, MD   BP 156/66 mmHg  Pulse 60  Temp(Src) 98.3 F (36.8 C) (Oral)  Resp 16  Ht 4\' 11"  (1.499 m)  Wt 160 lb (72.576 kg)  BMI 32.30 kg/m2  SpO2 92% Physical Exam  Constitutional: She is oriented to person, place, and time. She appears well-developed and well-nourished. No distress.  HENT:  Head: Normocephalic and atraumatic.  Right Ear: External ear normal.  Left Ear: External ear normal.  Nose: Nose normal.  Mouth/Throat: Oropharynx is clear and moist. No oropharyngeal exudate.  Eyes: EOM are normal. Pupils are equal, round, and reactive to light.  Neck: Normal range of motion. Neck supple.  Cardiovascular: Normal rate, regular rhythm and intact distal pulses.   Pulmonary/Chest: Effort normal. No respiratory distress. She has no wheezes. She has no rales.  Abdominal: Soft. She exhibits no distension. There is no tenderness.  Musculoskeletal: She exhibits no edema.       Right hip: Normal.       Left hip: Normal.       Right knee: Normal.       Left knee: Normal.       Thoracic back: She exhibits decreased range of  motion, tenderness, pain and spasm. She exhibits no swelling, no edema and no deformity.       Lumbar back: She exhibits decreased range of motion, tenderness, pain and spasm. She exhibits no swelling, no edema, no deformity, no laceration and normal pulse.  Neurological: She is alert and oriented to person, place, and time. She has normal strength. No sensory deficit.  Skin: Skin is warm and dry. No rash noted. She is not diaphoretic.  Vitals reviewed.   ED Course  Procedures (including critical care time) Labs Review Labs Reviewed - No data to display  Imaging Review Ct Head Wo Contrast  05/29/2016  CLINICAL DATA:  Fall today striking the back of the head. Back pain. EXAM: CT HEAD WITHOUT CONTRAST CT CERVICAL SPINE WITHOUT CONTRAST TECHNIQUE: Multidetector CT imaging of the head and cervical spine was performed following the standard protocol without intravenous contrast. Multiplanar CT image reconstructions of the cervical spine were also generated. COMPARISON:  None FINDINGS: CT HEAD FINDINGS Age-appropriate brain parenchymal tissue volume loss. Periventricular white matter and corona radiata hypodensities favor chronic ischemic microvascular white matter disease. Otherwise, The brainstem, cerebellum, cerebral peduncles, thalami, basal ganglia, basilar cisterns, and ventricular system appear within normal limits. No intracranial hemorrhage, mass lesion, or acute CVA. CT CERVICAL SPINE FINDINGS Calcified pannus posterior to the odontoid. Spurring and loss of articular space at the anterior C1-2 articulation. Multilevel loss of intervertebral disc height with likely acquired interbody fusion at C6-7 and severe loss of disc height also at C5-6. There is 4 mm of degenerative chronic appearing anterolisthesis at C4-5 with associated facet and anterior interbody fusion. Posterior osseous ridging at C5-6 and C6-7 leading to mild to moderate bilateral foraminal stenosis at the C5-6 level and borderline  left foraminal stenosis at C6-7. 2 mm of grade 1 anterolisthesis at C7-T1 and T1-2 probably degenerative given the degree of facet arthropathy. Poor definition of the intervertebral disc space at T2-3. At multiple levels in the cervical spine the facet joints are indistinct, possibly with facet joint fusion. No prevertebral soft tissue swelling identified. No cervical spine fracture is identified. Biapical pleural parenchymal scarring. IMPRESSION: 1. No acute cervical spine findings or acute intracranial findings. 2. Periventricular white matter and corona radiata hypodensities favor chronic ischemic microvascular white matter disease. 3. Considerable cervical spondylosis and degenerative disc disease with degenerative subluxations at several levels, and bony foraminal impingement at the C5-6 level. Electronically Signed   By: Van Clines M.D.   On: 05/29/2016 15:39   Ct Cervical Spine Wo Contrast  05/29/2016  CLINICAL DATA:  Fall today striking the back of the head. Back pain. EXAM: CT HEAD WITHOUT CONTRAST CT CERVICAL SPINE WITHOUT CONTRAST TECHNIQUE: Multidetector CT imaging of the head and cervical spine was performed following the standard protocol without intravenous contrast. Multiplanar CT image reconstructions of the cervical spine were also generated. COMPARISON:  None FINDINGS: CT HEAD FINDINGS Age-appropriate brain parenchymal tissue volume loss. Periventricular white matter and corona radiata hypodensities favor chronic ischemic microvascular white matter disease. Otherwise, The brainstem, cerebellum, cerebral peduncles, thalami, basal ganglia, basilar cisterns, and ventricular system appear within normal limits. No intracranial hemorrhage, mass lesion, or acute CVA. CT CERVICAL SPINE FINDINGS Calcified pannus posterior to the odontoid. Spurring and loss of articular space at the anterior C1-2 articulation. Multilevel loss of intervertebral disc height with likely acquired interbody fusion at  C6-7 and severe loss of disc height also at C5-6. There is 4 mm of degenerative chronic appearing anterolisthesis at C4-5 with associated facet and anterior interbody fusion. Posterior osseous ridging at C5-6 and C6-7 leading to mild to moderate bilateral foraminal stenosis at the C5-6 level and borderline left foraminal stenosis at C6-7. 2 mm of grade 1 anterolisthesis at C7-T1 and T1-2 probably degenerative given the degree of facet arthropathy. Poor definition of the intervertebral disc space at T2-3. At multiple levels in the cervical spine the facet joints are indistinct, possibly with facet joint fusion. No prevertebral soft tissue swelling identified. No cervical spine fracture is identified. Biapical pleural parenchymal scarring. IMPRESSION: 1. No acute cervical spine findings or acute intracranial findings. 2. Periventricular white matter and corona radiata hypodensities favor chronic ischemic microvascular  white matter disease. 3. Considerable cervical spondylosis and degenerative disc disease with degenerative subluxations at several levels, and bony foraminal impingement at the C5-6 level. Electronically Signed   By: Van Clines M.D.   On: 05/29/2016 15:39   Ct Thoracic Spine Wo Contrast  05/29/2016  CLINICAL DATA:  Fall on back, back pain. EXAM: CT THORACIC SPINE WITHOUT CONTRAST TECHNIQUE: Multidetector CT imaging of the thoracic spine was performed without intravenous contrast administration. Multiplanar CT image reconstructions were also generated. COMPARISON:  12/03/2015 FINDINGS: Lower cervical spondylosis and degenerative disc disease as detailed with prior cervical spine CT. Thoracic kyphosis. There is 2 mm of grade 1 anterolisthesis at T1-2 and at T2-3. Bony demineralization. Considerable multilevel thoracic spondylosis. Bridging spurring anteriorly at multiple levels along the vertebral column along with multilevel facet arthropathy. Small rim sclerotic lesion in the T12 vertebral  body eccentric to the right adjacent to the pedicle, no change from 12/03/2015. Old healed right eleventh rib fracture posteriorly. No acute thoracic spine fracture identified. No acute thoracic subluxation. Dense calcification along the mitral valve. IMPRESSION: 1. Considerable thoracic spondylosis with degenerative subluxations at several levels, but no fracture or acute subluxation is identified. 2. Dense mitral annular calcification. Electronically Signed   By: Van Clines M.D.   On: 05/29/2016 15:58   Ct Lumbar Spine Wo Contrast  05/29/2016  CLINICAL DATA:  Fall, back pain. EXAM: CT LUMBAR SPINE WITHOUT CONTRAST TECHNIQUE: Multidetector CT imaging of the lumbar spine was performed without intravenous contrast administration. Multiplanar CT image reconstructions were also generated. COMPARISON:  12/03/2015 FINDINGS: There is levoconvex lumbar scoliosis with considerable rotary component. Acquired interbody fusion at L5-S1 with prominent loss of disc height at all levels except L2-3. Vacuum disc phenomenon at multiple levels. Grade 1 anterolisthesis at L4-5. 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 Aortoiliac atherosclerotic vascular disease. Additional findings at individual levels are as follows: L1-2: Moderate right and mild left foraminal stenosis and moderate central narrowing of the thecal sac due to intervertebral spurring, facet spurring, and ligamentum flavum redundancy. L2-3: Mild central narrowing of the thecal sac and borderline bilateral foraminal stenosis due to facet arthropathy and intervertebral spurring. L3-4: Moderate to prominent central narrowing of the thecal sac with mild to moderate bilateral foraminal stenosis due to facet arthropathy, intervertebral spurring, and disc bulge. L4-5: Prominent central narrowing of the thecal sac with at least moderate right and mild left foraminal stenosis due  to disc uncovering, facet arthropathy, and intervertebral spurring. L5-S1: No impingement. Disc bulge noted with intervertebral and facet spurring. IMPRESSION: 1. No acute fracture or subluxation involving the lumbar spine is identified. 2. Considerable lumbar spondylosis, scoliosis, and degenerative disc disease, resulting in multilevel impingement as detailed above. 3.  Aortoiliac atherosclerotic vascular disease. Electronically Signed   By: Van Clines M.D.   On: 05/29/2016 16:03   I have personally reviewed and evaluated these images and lab results as part of my medical decision-making.   EKG Interpretation None      MDM  Patient was seen and evaluated in stable condition.  Mechanical fall.  Patient at her baseline.  Pleasant, alert, appropriate.  She was given Tylenol for pain control.  CTs of head, cervical spine, thoracic and lumbar spine negative for acute process or fracture.  Patient felt improved after tylenol and ambulated well at her baseline.  She stated that she has a caregiver who can assist her at her nursing  facility.  She was discharged home in stable condition with strict return precautions. Final diagnoses:  None    1. Mechanical fall  2. Back pain    Harvel Quale, MD 05/31/16 2235

## 2016-05-29 NOTE — Discharge Instructions (Signed)
You were seen and evaluated today following her fall. We did not find any acute traumatic injuries or fractures. You do have degenerative or arthritic changes in your back that are likely causing lots of your pain. Please use the pain medications prescribed. Follow-up with your primary care physician. Have your caregiver help you with your activities.  Fall Prevention in the Home  Falls can cause injuries and can affect people from all age groups. There are many simple things that you can do to make your home safe and to help prevent falls. WHAT CAN I DO ON THE OUTSIDE OF MY HOME?  Regularly repair the edges of walkways and driveways and fix any cracks.  Remove high doorway thresholds.  Trim any shrubbery on the main path into your home.  Use bright outdoor lighting.  Clear walkways of debris and clutter, including tools and rocks.  Regularly check that handrails are securely fastened and in good repair. Both sides of any steps should have handrails.  Install guardrails along the edges of any raised decks or porches.  Have leaves, snow, and ice cleared regularly.  Use sand or salt on walkways during winter months.  In the garage, clean up any spills right away, including grease or oil spills. WHAT CAN I DO IN THE BATHROOM?  Use night lights.  Install grab bars by the toilet and in the tub and shower. Do not use towel bars as grab bars.  Use non-skid mats or decals on the floor of the tub or shower.  If you need to sit down while you are in the shower, use a plastic, non-slip stool.[place] [employer]  Keep the floor dry. Immediately clean up any water that spills on the floor.  Remove soap buildup in the tub or shower on a regular basis.  Attach bath mats securely with double-sided non-slip rug tape.  Remove throw rugs and other tripping hazards from the floor. WHAT CAN I DO IN THE BEDROOM?  Use night lights.  Make sure that a bedside light is easy to reach.  Do not use oversized  bedding that drapes onto the floor.  Have a firm chair that has side arms to use for getting dressed.  Remove throw rugs and other tripping hazards from the floor. WHAT CAN I DO IN THE KITCHEN?   Clean up any spills right away.  Avoid walking on wet floors.  Place frequently used items in easy-to-reach places.  If you need to reach for something above you, use a sturdy step stool that has a grab bar.  Keep electrical cables out of the way.  Do not use floor polish or wax that makes floors slippery. If you have to use wax, make sure that it is non-skid floor wax.  Remove throw rugs and other tripping hazards from the floor. WHAT CAN I DO IN THE STAIRWAYS?  Do not leave any items on the stairs.  Make sure that there are handrails on both sides of the stairs. Fix handrails that are broken or loose. Make sure that handrails are as long as the stairways.  Check any carpeting to make sure that it is firmly attached to the stairs. Fix any carpet that is loose or worn.  Avoid having throw rugs at the top or bottom of stairways, or secure the rugs with carpet tape to prevent them from moving.  Make sure that you have a light switch at the top of the stairs and the bottom of the stairs. If you do not have  them, have them installed. WHAT ARE SOME OTHER FALL PREVENTION TIPS?  Wear closed-toe shoes that fit well and support your feet. Wear shoes that have rubber soles or low heels.  When you use a stepladder, make sure that it is completely opened and that the sides are firmly locked. Have someone hold the ladder while you are using it. Do not climb a closed stepladder.  Add color or contrast paint or tape to grab bars and handrails in your home. Place contrasting color strips on the first and last steps.  Use mobility aids as needed, such as canes, walkers, scooters, and crutches.  Turn on lights if it is dark. Replace any light bulbs that burn out.  Set up furniture so that there are  clear paths. Keep the furniture in the same spot.  Fix any uneven floor surfaces.  Choose a carpet design that does not hide the edge of steps of a stairway.  Be aware of any and all pets.  Review your medicines with your healthcare provider. Some medicines can cause dizziness or changes in blood pressure, which increase your risk of falling. Talk with your health care provider about other ways that you can decrease your risk of falls. This may include working with a physical therapist or trainer to improve your strength, balance, and endurance.   This information is not intended to replace advice given to you by your health care provider. Make sure you discuss any questions you have with your health care provider.   Document Released: 10/19/2002 Document Revised: 03/15/2015 Document Reviewed: 12/03/2014 Elsevier Interactive Patient Education 2016 Else[employer]  Back Pain, Adult Back pain is very common in adults.The cause of back pain is rarely dangerous and the pain often gets better over time.The cause of your back pain may not be known. Some common causes of back pain include:  Strain of the muscles or ligaments supporting the spine.  Wear and tear (degeneration) of the spinal disks.  Arthritis.  Direct injury to the back. For many people, back pain may return. Since back pain is rarely dangerous, most people can learn to manage this condition on their own. HOME CARE INSTRUCTIONS Watch your back pain for any changes. The following actions may help to lessen any discomfort you are feeling:  Remain active. It is stressful on your back to sit or stand in one place for long periods of time. Do not sit, drive, or stand in one place for more than 30 minutes at a time. Take short walks on even surfaces as soon as you are able.Try to increase the length of time you walk each day.  Exercise regularly as directed by your health care provider. Exercise helps your back heal faster. It  also helps avoid future injury by keeping your muscles strong and flexible.  Do not stay in bed.Resting more than 1-2 days can delay your recovery.  Pay attention to your body when you bend and lift. The most comfortable positions are those that put less stress on your recovering back. Always use proper lifting techniques, including:  Bending your knees.  Keeping the load close to your body.  Avoiding twisting.  Find a comfortable position to sleep. Use a firm mattress and lie on your side with your knees slightly bent. If you lie on your back, put a pillow under your knees.  Avoid feeling anxious or stressed.Stress increases muscle tension and can worsen back pain.It is important to recognize when you are anxious or stressed and learn  ways to manage it, such as with exercise.  Take medicines only as directed by your health care provider. Over-the-counter medicines to reduce pain and inflammation are often the most helpful.Your health care provider may prescribe muscle relaxant drugs.These medicines help dull your pain so you can more quickly return to your normal activities and healthy exercise.  Apply ice to the injured area:  Put ice in a plastic bag.  Place a towel between your skin and the bag.  Leave the ice on for 20 minutes, 2-3 times a day for the first 2-3 days. After that, ice and heat may be alternated to reduce pain and spasms.  Maintain a healthy weight. Excess weight puts extra stress on your back and makes it difficult to maintain good posture. SEEK MEDICAL CARE IF:  You have pain that is not relieved with rest or medicine.  You have increasing pain going down into the legs or buttocks.  You have pain that does not improve in one week.  You have night pain.  You lose weight.  You have a fever or chills. SEEK IMMEDIATE MEDICAL CARE IF:   You develop new bowel or bladder control problems.  You have unusual weakness or numbness in your arms or  legs.  You develop nausea or vomiting.  You develop abdominal pain.  You feel faint.   This information is not intended to replace advice given to you by your health care provider. Make sure you discuss any questions you have with your health care provider.   Document Released: 10/29/2005 Document Revised: 11/19/2014 Document Reviewed: 03/02/2014 Elsevier Interactive Patient Education [employer].

## 2016-05-29 NOTE — ED Notes (Signed)
Coming from [facility] with c.o falling after a door hit her knocking her down. Upper back pain.

## 2016-05-29 NOTE — ED Notes (Signed)
Pt ambulated with walker and with assistance. Pt stated she was in immense pain in her back while walking and did not walk far.

## 2016-06-05 DIAGNOSIS — M25551 Pain in right hip: Secondary | ICD-10-CM | POA: Diagnosis not present

## 2016-06-05 DIAGNOSIS — M25512 Pain in left shoulder: Secondary | ICD-10-CM | POA: Diagnosis not present

## 2016-06-05 DIAGNOSIS — R262 Difficulty in walking, not elsewhere classified: Secondary | ICD-10-CM | POA: Diagnosis not present

## 2016-06-05 DIAGNOSIS — M25561 Pain in right knee: Secondary | ICD-10-CM | POA: Diagnosis not present

## 2016-06-05 DIAGNOSIS — M545 Low back pain: Secondary | ICD-10-CM | POA: Diagnosis not present

## 2016-06-05 DIAGNOSIS — M6281 Muscle weakness (generalized): Secondary | ICD-10-CM | POA: Diagnosis not present

## 2016-06-06 DIAGNOSIS — R262 Difficulty in walking, not elsewhere classified: Secondary | ICD-10-CM | POA: Diagnosis not present

## 2016-06-06 DIAGNOSIS — M25551 Pain in right hip: Secondary | ICD-10-CM | POA: Diagnosis not present

## 2016-06-06 DIAGNOSIS — M6281 Muscle weakness (generalized): Secondary | ICD-10-CM | POA: Diagnosis not present

## 2016-06-06 DIAGNOSIS — M545 Low back pain: Secondary | ICD-10-CM | POA: Diagnosis not present

## 2016-06-06 DIAGNOSIS — M25512 Pain in left shoulder: Secondary | ICD-10-CM | POA: Diagnosis not present

## 2016-06-06 DIAGNOSIS — M25561 Pain in right knee: Secondary | ICD-10-CM | POA: Diagnosis not present

## 2016-06-11 DIAGNOSIS — M6281 Muscle weakness (generalized): Secondary | ICD-10-CM | POA: Diagnosis not present

## 2016-06-11 DIAGNOSIS — R262 Difficulty in walking, not elsewhere classified: Secondary | ICD-10-CM | POA: Diagnosis not present

## 2016-06-11 DIAGNOSIS — M25561 Pain in right knee: Secondary | ICD-10-CM | POA: Diagnosis not present

## 2016-06-11 DIAGNOSIS — M545 Low back pain: Secondary | ICD-10-CM | POA: Diagnosis not present

## 2016-06-11 DIAGNOSIS — M25512 Pain in left shoulder: Secondary | ICD-10-CM | POA: Diagnosis not present

## 2016-06-11 DIAGNOSIS — M25551 Pain in right hip: Secondary | ICD-10-CM | POA: Diagnosis not present

## 2016-06-12 DIAGNOSIS — R262 Difficulty in walking, not elsewhere classified: Secondary | ICD-10-CM | POA: Diagnosis not present

## 2016-06-12 DIAGNOSIS — M545 Low back pain: Secondary | ICD-10-CM | POA: Diagnosis not present

## 2016-06-12 DIAGNOSIS — M25561 Pain in right knee: Secondary | ICD-10-CM | POA: Diagnosis not present

## 2016-06-12 DIAGNOSIS — R296 Repeated falls: Secondary | ICD-10-CM | POA: Diagnosis not present

## 2016-06-12 DIAGNOSIS — Z9181 History of falling: Secondary | ICD-10-CM | POA: Diagnosis not present

## 2016-06-12 DIAGNOSIS — M25512 Pain in left shoulder: Secondary | ICD-10-CM | POA: Diagnosis not present

## 2016-06-12 DIAGNOSIS — M25551 Pain in right hip: Secondary | ICD-10-CM | POA: Diagnosis not present

## 2016-06-12 DIAGNOSIS — M6281 Muscle weakness (generalized): Secondary | ICD-10-CM | POA: Diagnosis not present

## 2016-06-14 DIAGNOSIS — R262 Difficulty in walking, not elsewhere classified: Secondary | ICD-10-CM | POA: Diagnosis not present

## 2016-06-14 DIAGNOSIS — M25561 Pain in right knee: Secondary | ICD-10-CM | POA: Diagnosis not present

## 2016-06-14 DIAGNOSIS — M545 Low back pain: Secondary | ICD-10-CM | POA: Diagnosis not present

## 2016-06-14 DIAGNOSIS — M25512 Pain in left shoulder: Secondary | ICD-10-CM | POA: Diagnosis not present

## 2016-06-14 DIAGNOSIS — M25551 Pain in right hip: Secondary | ICD-10-CM | POA: Diagnosis not present

## 2016-06-14 DIAGNOSIS — M6281 Muscle weakness (generalized): Secondary | ICD-10-CM | POA: Diagnosis not present

## 2016-06-18 DIAGNOSIS — M6281 Muscle weakness (generalized): Secondary | ICD-10-CM | POA: Diagnosis not present

## 2016-06-18 DIAGNOSIS — M25561 Pain in right knee: Secondary | ICD-10-CM | POA: Diagnosis not present

## 2016-06-18 DIAGNOSIS — M25551 Pain in right hip: Secondary | ICD-10-CM | POA: Diagnosis not present

## 2016-06-18 DIAGNOSIS — M25512 Pain in left shoulder: Secondary | ICD-10-CM | POA: Diagnosis not present

## 2016-06-18 DIAGNOSIS — M545 Low back pain: Secondary | ICD-10-CM | POA: Diagnosis not present

## 2016-06-18 DIAGNOSIS — R262 Difficulty in walking, not elsewhere classified: Secondary | ICD-10-CM | POA: Diagnosis not present

## 2016-06-21 ENCOUNTER — Encounter: Payer: Self-pay | Admitting: Podiatry

## 2016-06-21 ENCOUNTER — Ambulatory Visit (INDEPENDENT_AMBULATORY_CARE_PROVIDER_SITE_OTHER): Payer: Medicare Other | Admitting: Podiatry

## 2016-06-21 DIAGNOSIS — L600 Ingrowing nail: Secondary | ICD-10-CM | POA: Diagnosis not present

## 2016-06-21 DIAGNOSIS — M79606 Pain in leg, unspecified: Secondary | ICD-10-CM | POA: Diagnosis not present

## 2016-06-21 DIAGNOSIS — B351 Tinea unguium: Secondary | ICD-10-CM

## 2016-06-21 NOTE — Progress Notes (Signed)
SUBJECTIVE: 82 y.o. year old female presents using walker requesting painful toe nails trimmed. Left big toe has ingrown nail and hurts to walk. Patient wears compression stockings for ankle edema. Having hard of hearing.   OBJECTIVE: DERMATOLOGIC EXAMINATION: Painful ingrown nails both great toes without open infection or drainage. Hypertrophic nails x 10. VASCULAR EXAMINATION OF LOWER LIMBS: Pedal pulses: All pedal pulses are palpable with normal pulsation. Minimum edema noted. Temperature gradient from tibial crest to dorsum of foot is within normal bilateral. NEUROLOGIC EXAMINATION OF THE LOWER LIMBS: All epicritic and tactile sensations grossly intact. MUSCULOSKELETAL EXAMINATION: Mild digital contracture lesser digits bilateral.  ASSESSMENT: Onychomycosis x 10. Ingrown nail both great toes. Pain in lower limb.  Plan: All nails debrided. Pain was relieved. Return in 3 months

## 2016-06-21 NOTE — Patient Instructions (Signed)
Seen for hypertrophic nails. All nails debrided. Return in 3 months or as needed.  

## 2016-06-26 ENCOUNTER — Telehealth: Payer: Self-pay | Admitting: Cardiology

## 2016-06-26 DIAGNOSIS — M25551 Pain in right hip: Secondary | ICD-10-CM | POA: Diagnosis not present

## 2016-06-26 DIAGNOSIS — R262 Difficulty in walking, not elsewhere classified: Secondary | ICD-10-CM | POA: Diagnosis not present

## 2016-06-26 DIAGNOSIS — M6281 Muscle weakness (generalized): Secondary | ICD-10-CM | POA: Diagnosis not present

## 2016-06-26 DIAGNOSIS — M25512 Pain in left shoulder: Secondary | ICD-10-CM | POA: Diagnosis not present

## 2016-06-26 DIAGNOSIS — M25561 Pain in right knee: Secondary | ICD-10-CM | POA: Diagnosis not present

## 2016-06-26 DIAGNOSIS — M545 Low back pain: Secondary | ICD-10-CM | POA: Diagnosis not present

## 2016-06-26 NOTE — Telephone Encounter (Signed)
Spoke with pt son, the pt is having swelling in her feet and legs. She is wearing the compression hose but they are beginning to swell over the top of the hose and hurt her legs. Low salt diet, elevating legs and fluid restrictions discussed in length with son. He is going to try to get the pt the hose that are thigh high or like panty hose to try. She is not having any SOB. The pt really does not want to increase the furosemide because she is unable to get to the bathroom. They will monitor her symptoms and call back if needed.

## 2016-06-26 NOTE — Telephone Encounter (Signed)
New message       Pt son would like an earlier appt than offered with the doctor on 08/08/16 and with the APP on 07/31/16. Please call.

## 2016-06-28 DIAGNOSIS — M25551 Pain in right hip: Secondary | ICD-10-CM | POA: Diagnosis not present

## 2016-06-28 DIAGNOSIS — M25561 Pain in right knee: Secondary | ICD-10-CM | POA: Diagnosis not present

## 2016-06-28 DIAGNOSIS — M545 Low back pain: Secondary | ICD-10-CM | POA: Diagnosis not present

## 2016-06-28 DIAGNOSIS — M6281 Muscle weakness (generalized): Secondary | ICD-10-CM | POA: Diagnosis not present

## 2016-06-28 DIAGNOSIS — M25512 Pain in left shoulder: Secondary | ICD-10-CM | POA: Diagnosis not present

## 2016-06-28 DIAGNOSIS — R262 Difficulty in walking, not elsewhere classified: Secondary | ICD-10-CM | POA: Diagnosis not present

## 2016-07-02 ENCOUNTER — Telehealth: Payer: Self-pay | Admitting: *Deleted

## 2016-07-02 DIAGNOSIS — R262 Difficulty in walking, not elsewhere classified: Secondary | ICD-10-CM | POA: Diagnosis not present

## 2016-07-02 DIAGNOSIS — M545 Low back pain: Secondary | ICD-10-CM | POA: Diagnosis not present

## 2016-07-02 DIAGNOSIS — M25561 Pain in right knee: Secondary | ICD-10-CM | POA: Diagnosis not present

## 2016-07-02 DIAGNOSIS — M6281 Muscle weakness (generalized): Secondary | ICD-10-CM | POA: Diagnosis not present

## 2016-07-02 DIAGNOSIS — M25512 Pain in left shoulder: Secondary | ICD-10-CM | POA: Diagnosis not present

## 2016-07-02 DIAGNOSIS — M25551 Pain in right hip: Secondary | ICD-10-CM | POA: Diagnosis not present

## 2016-07-02 NOTE — Telephone Encounter (Signed)
Spoke with pt, she continues to have swelling in her feet and legs. She would like to be seen. Follow up scheduled with hao meng.

## 2016-07-02 NOTE — Telephone Encounter (Signed)
Pt came into the office today requesting a call regarding her swelling. Left message for pt to call

## 2016-07-03 DIAGNOSIS — H01002 Unspecified blepharitis right lower eyelid: Secondary | ICD-10-CM | POA: Diagnosis not present

## 2016-07-03 DIAGNOSIS — H26492 Other secondary cataract, left eye: Secondary | ICD-10-CM | POA: Diagnosis not present

## 2016-07-03 DIAGNOSIS — H353221 Exudative age-related macular degeneration, left eye, with active choroidal neovascularization: Secondary | ICD-10-CM | POA: Diagnosis not present

## 2016-07-03 DIAGNOSIS — H01001 Unspecified blepharitis right upper eyelid: Secondary | ICD-10-CM | POA: Diagnosis not present

## 2016-07-03 DIAGNOSIS — H524 Presbyopia: Secondary | ICD-10-CM | POA: Diagnosis not present

## 2016-07-04 ENCOUNTER — Encounter: Payer: Self-pay | Admitting: Physician Assistant

## 2016-07-04 ENCOUNTER — Ambulatory Visit (INDEPENDENT_AMBULATORY_CARE_PROVIDER_SITE_OTHER): Payer: Medicare Other | Admitting: Physician Assistant

## 2016-07-04 VITALS — BP 104/68 | HR 62 | Ht 59.0 in | Wt 172.8 lb

## 2016-07-04 DIAGNOSIS — E039 Hypothyroidism, unspecified: Secondary | ICD-10-CM

## 2016-07-04 DIAGNOSIS — I10 Essential (primary) hypertension: Secondary | ICD-10-CM

## 2016-07-04 DIAGNOSIS — R011 Cardiac murmur, unspecified: Secondary | ICD-10-CM

## 2016-07-04 DIAGNOSIS — I5033 Acute on chronic diastolic (congestive) heart failure: Secondary | ICD-10-CM | POA: Diagnosis not present

## 2016-07-04 DIAGNOSIS — I350 Nonrheumatic aortic (valve) stenosis: Secondary | ICD-10-CM

## 2016-07-04 DIAGNOSIS — I5032 Chronic diastolic (congestive) heart failure: Secondary | ICD-10-CM | POA: Diagnosis not present

## 2016-07-04 LAB — CBC WITH DIFFERENTIAL/PLATELET
BASOS PCT: 1 %
Basophils Absolute: 60 cells/uL (ref 0–200)
EOS ABS: 60 {cells}/uL (ref 15–500)
EOS PCT: 1 %
HCT: 34 % — ABNORMAL LOW (ref 35.0–45.0)
Hemoglobin: 11.2 g/dL — ABNORMAL LOW (ref 11.7–15.5)
LYMPHS ABS: 1680 {cells}/uL (ref 850–3900)
Lymphocytes Relative: 28 %
MCH: 30.8 pg (ref 27.0–33.0)
MCHC: 32.9 g/dL (ref 32.0–36.0)
MCV: 93.4 fL (ref 80.0–100.0)
MONOS PCT: 14 %
MPV: 9.8 fL (ref 7.5–12.5)
Monocytes Absolute: 840 cells/uL (ref 200–950)
NEUTROS ABS: 3360 {cells}/uL (ref 1500–7800)
Neutrophils Relative %: 56 %
PLATELETS: 161 10*3/uL (ref 140–400)
RBC: 3.64 MIL/uL — AB (ref 3.80–5.10)
RDW: 14.1 % (ref 11.0–15.0)
WBC: 6 10*3/uL (ref 3.8–10.8)

## 2016-07-04 LAB — BASIC METABOLIC PANEL
BUN: 24 mg/dL (ref 7–25)
CALCIUM: 9.5 mg/dL (ref 8.6–10.4)
CHLORIDE: 100 mmol/L (ref 98–110)
CO2: 28 mmol/L (ref 20–31)
CREATININE: 1 mg/dL — AB (ref 0.60–0.88)
Glucose, Bld: 88 mg/dL (ref 65–99)
Potassium: 4.5 mmol/L (ref 3.5–5.3)
SODIUM: 138 mmol/L (ref 135–146)

## 2016-07-04 MED ORDER — FUROSEMIDE 40 MG PO TABS: 80.0000 mg | ORAL_TABLET | Freq: Every day | ORAL | 3 refills | Status: DC

## 2016-07-04 NOTE — Patient Instructions (Addendum)
Medication Instructions:  Your physician has recommended you make the following change in your medication:  INCREASE Lasix to 80mg  daily   Labwork: Bmet and Cbc today  Your physician recommends that you return for lab work in: 1 week (Bmet)   Testing/Procedures: None ordered  Follow-Up: Your physician recommends that you schedule a follow-up appointment in: 1-2 weeks    Any Other Special Instructions Will Be Listed Below (If Applicable).     If you need a refill on your cardiac medications before your next appointment, please call your pharmacy.

## 2016-07-04 NOTE — Progress Notes (Signed)
Cardiology Office Note    Date:  07/04/2016   ID:  Kathy Hoover, DOB 01/30/1916, MRN [id number]  PCP:  Anthoney Harada, MD  Cardiologist:  Dr. Stanford Breed  Chief Complaint  Patient presents with  . Follow-up    seen for Dr. Stanford Breed    History of Present Illness:  Kathy Hoover is a 82 y.o. female with PMH of hypothyroidism, hypertension, moderate AS and chronic diastolic heart failure. Echocardiogram obtained in 02/2014 showed moderate AS with mean gradient 28 mmHg. Repeat echocardiogram in July 2016 showed normal LV function, mild AF, moderate AI, mild MS, PA peak pressure 45 mmHg. Given her advanced age, plan for conservative therapy, no plan for repeat echocardiogram based on Dr. Jacalyn Lefevre office visit in February 2017. She is currently living in assisted living facility. Her Lasix was increased to 80 mg for 3 days in June with minimal improvement. Metolazone 2.5 mg every other day, her condition improved after 2 doses. She did not take more because it made her sick with stools with diarrhea. She presents today for 2 month follow-up.  Since last visit, she was seen in the ED on 05/29/2016 after a mechanical fall. She landed on her buttock, back and hit her head. She was CT scanned from head to lumbar area which showed no acute fractures. Based on phone record, she has recurrence of lower extremity swelling. Her weight today is 172 pounds, in July, her weight was 168 pounds. She has 2+ pitting edema in bilateral lower extremity. She also has mild redness in the bilateral lower extremity which was warm. She clearly is fluid overloaded, I will check a CBC and BMET today. She has been taking Lasix 80 mg daily for the past 3 days, up from 40 mg daily previously. I have instructed her to continue on current dose of Lasix for another week, and we'll see her back in one to 2 weeks. She will have a repeat BMET in 1 week. As for her mild redness, I have asked her to monitor for any sign of fever and  chill. I'm concerned she may have very mild cellulitis, and wanted to treat her with doxycycline 100 mg twice a day for 5 days. However the son says she has intolerance to multiple antibiotics and wished to discuss with her PCP Dr. Jacelyn Grip first. He understand that she will need to see Dr. Jacelyn Grip sooner if the redness worsens. We have also went over fluid and sodium restriction. She says she does not drink that much nor does she eat salty food. I have asked her to weigh herself every morning.    Past Medical History:  Diagnosis Date  . Anxiety   . Anxiety and depression   . Arthritis    "terribly in my hands" (01/13/2014)  . Bacterial pneumonia 01/13/2014  . Breast cancer ([place])   . Cervical disc disease    W radiculopathy S/P epidural injections  . Chronic bronchitis ([place])    "@ least once a year;  in the winter" (01/13/2014)  . Chronic diastolic CHF (congestive heart failure) ([facility])    Preserved EF  . Depression   . Exertional shortness of breath    "worse recently" (01/13/2014)  . Hypertension   . Hypothyroidism   . Lower extremity edema    CHRONIC  . Macular degeneration    Dr Hillis Range Follows for pt.  . Moderate aortic stenosis 05/2012   by ECHO  . Pneumonia    "once before today" (01/13/2014)  . Vitamin  D deficiency     Past Surgical History:  Procedure Laterality Date  . ABDOMINAL HYSTERECTOMY    . APPENDECTOMY  1949  . BREAST BIOPSY Right ~ 2005  . BREAST LUMPECTOMY Right ~ 2005  . CATARACT EXTRACTION W/ INTRAOCULAR LENS  IMPLANT, BILATERAL Bilateral ~ 2008  . TONSILLECTOMY  1949    Current Medications: Outpatient Medications Prior to Visit  Medication Sig Dispense Refill  . acetaminophen (TYLENOL) 325 MG tablet Take 2 tablets (650 mg total) by mouth every 6 (six) hours as needed (pain or Fever >/= 101).    Marland Kitchen amLODipine (NORVASC) 2.5 MG tablet Take 1 tablet (2.5 mg total) by mouth daily. 90 tablet 3  . aspirin 81 MG EC tablet Take 1 tablet (81 mg total) by mouth every other  day. 30 tablet 0  . Cholecalciferol 2000 units CAPS Take 2,000 Units by mouth every evening.    Marland Kitchen levothyroxine (SYNTHROID, LEVOTHROID) 75 MCG tablet Take 1 tablet (75 mcg total) by mouth daily before breakfast. 90 tablet 3  . lidocaine (LIDODERM) 5 % Place 1 patch onto the skin daily. Remove & Discard patch within 12 hours or as directed by MD 15 patch 0  . Multiple Vitamin (MULTIVITAMIN WITH MINERALS) TABS Take 1 tablet by mouth as needed (vitamin supplement).     . Multiple Vitamins-Minerals (PRESERVISION AREDS 2 PO) Take 1 tablet by mouth daily.     . nitroGLYCERIN (NITROSTAT) 0.4 MG SL tablet Place 1 tablet (0.4 mg total) under the tongue every 5 (five) minutes as needed for chest pain. 25 tablet 1  . potassium chloride (K-DUR) 10 MEQ tablet Take 1 tablet (10 mEq total) by mouth daily. 90 tablet 3  . Probiotic Product (SOLUBLE FIBER/PROBIOTICS PO) Take 1 tablet by mouth daily.    . vitamin C (ASCORBIC ACID) 500 MG tablet Take 500 mg by mouth daily.    . furosemide (LASIX) 40 MG tablet Take 1 tablet (40 mg total) by mouth daily. 90 tablet 3  . metolazone (ZAROXOLYN) 2.5 MG tablet Take 1 tablet (2.5 mg total) by mouth every other day. 20 tablet 0   No facility-administered medications prior to visit.      Allergies:   Penicillins; Ciprofloxacin; Codeine; and Metronidazole   Social History   Social History  . Marital status: Widowed    Spouse name: N/A  . Number of children: N/A  . Years of education: N/A   Social History Main Topics  . Smoking status: Never Smoker  . Smokeless tobacco: Never Used  . Alcohol use No  . Drug use: No  . Sexual activity: No   Other Topics Concern  . None   Social History Narrative  . None     Family History:  The patient's family history includes CVA in her brother and father; Multiple sclerosis in her brother; Pneumonia in her father.   ROS:   Please see the history of present illness.    ROS All other systems reviewed and are  negative.   PHYSICAL EXAM:   VS:  BP 104/68   Pulse 62   Ht 4' 11" (1.499 m)   Wt 172 lb 12.8 oz (78.4 kg)   SpO2 92%   BMI 34.90 kg/m    GEN: Well nourished, well developed, in no acute distress  HEENT: normal  Neck: no JVD, carotid bruits, or masses Cardiac: RRR; no rubs, or gallops 2-3+ pitting edema, 4/6 systolic murmur from apex all the way to RUSB Respiratory:  clear to  auscultation bilaterally, normal work of breathing GI: soft, nontender, nondistended, + BS MS: no deformity or atrophy  Skin: anterior shin mildly erythematous on exam, warm to touch Neuro:  Alert and Oriented x 3, Strength and sensation are intact Psych: euthymic mood, full affect  Wt Readings from Last 3 Encounters:  07/04/16 172 lb 12.8 oz (78.4 kg)  05/29/16 160 lb (72.6 kg)  05/16/16 168 lb (76.2 kg)      Studies/Labs Reviewed:   EKG:  EKG is not ordered today.    Recent Labs: 12/03/2015: B Natriuretic Peptide 311.6; Hemoglobin 11.3; Platelets PLATELET CLUMPS NOTED ON SMEAR 04/25/2016: BUN 33; Creat 1.16; Potassium 3.8; Sodium 137   Lipid Panel No results found for: CHOL, TRIG, HDL, CHOLHDL, VLDL, LDLCALC, LDLDIRECT  Additional studies/ records that were reviewed today include:   Echo 06/07/2015 LV EF: 55% -   60%  ------------------------------------------------------------------- Indications:      Dyspnea 786.09.  ------------------------------------------------------------------- History:   PMH:   Congestive heart failure.  Risk factors: Hypertension.  ------------------------------------------------------------------- Study Conclusions  - Left ventricle: The cavity size was mildly reduced. Wall   thickness was increased in a pattern of moderate LVH. Systolic   function was normal. The estimated ejection fraction was in the   range of 55% to 60%. Wall motion was normal; there were no   regional wall motion abnormalities. - Aortic valve: Mildly to moderately calcified annulus.  Moderately   thickened leaflets. There was very mild stenosis. There was   moderate regurgitation. Valve area (VTI): 2 cm^2. Valve area   (Vmax): 1.19 cm^2. Valve area (Vmean): 1.64 cm^2. - Mitral valve: Moderately to severely calcified annulus. Valve   area by continuity equation (using LVOT flow): 1.31 cm^2. - Pulmonary arteries: Systolic pressure was moderately increased.   PA peak pressure: 45 mm Hg (S).   ASSESSMENT:    1. Acute on chronic diastolic heart failure ([place])   2. Hypothyroidism, unspecified hypothyroidism type   3. Essential hypertension   4. Moderate aortic stenosis   5. Murmur, cardiac      PLAN:  In order of problems listed above:  1. Acute chronic diastolic heart failure - She has at least 2 pitting edema bilateral lower extremity, she has been taking 80 mg daily of Lasix, up from previous 40 mg daily for the past 3 days. She is still fluid overloaded noted on physical exam, we will continue on the current dose of Lasix for at least another week. She will obtain CBC and be met today and the repeat be met he will week. We will see her back in 1 to 2 weeks  2. LE redness: I am concerned of early cellulitis and wanted to prophylactically treat her with 164m BID Doxycycline, however she has intolerance to multiple antibiotics and want to clear with her PCP first. She denies any recent fever or chill.  3. Hypothyroidism: On Synthroid  4. Hypertension: BP stable 104/68.  5. moderate AS: Significant murmur noted on physical exam, she may have moderate to moderate severe AS which also likely contributed to her fluid overload symptom. However given her age, she is not a candidate for any invasive workup. I will not repeat an echocardiogram at this time.    Medication Adjustments/Labs and Tests Ordered: Current medicines are reviewed at length with the patient today.  Concerns regarding medicines are outlined above.  Medication changes, Labs and Tests ordered today are  listed in the Patient Instructions below. Patient Instructions  Medication Instructions:  Your  physician has recommended you make the following change in your medication:  INCREASE Lasix to 85m daily   Labwork: Bmet and Cbc today  Your physician recommends that you return for lab work in: 1 week (Bmet)   Testing/Procedures: None ordered  Follow-Up: Your physician recommends that you schedule a follow-up appointment in: 1-2 weeks    Any Other Special Instructions Will Be Listed Below (If Applicable).     If you need a refill on your cardiac medications before your next appointment, please call your pharmacy.      SHilbert Corrigan PUtah 07/04/2016 12:44 PM    [facility] [address] [place]  [id number]: [phone] Fax: [phone]

## 2016-07-05 DIAGNOSIS — M25512 Pain in left shoulder: Secondary | ICD-10-CM | POA: Diagnosis not present

## 2016-07-05 DIAGNOSIS — R262 Difficulty in walking, not elsewhere classified: Secondary | ICD-10-CM | POA: Diagnosis not present

## 2016-07-05 DIAGNOSIS — M545 Low back pain: Secondary | ICD-10-CM | POA: Diagnosis not present

## 2016-07-05 DIAGNOSIS — M25561 Pain in right knee: Secondary | ICD-10-CM | POA: Diagnosis not present

## 2016-07-05 DIAGNOSIS — M25551 Pain in right hip: Secondary | ICD-10-CM | POA: Diagnosis not present

## 2016-07-05 DIAGNOSIS — M6281 Muscle weakness (generalized): Secondary | ICD-10-CM | POA: Diagnosis not present

## 2016-07-09 ENCOUNTER — Inpatient Hospital Stay (HOSPITAL_BASED_OUTPATIENT_CLINIC_OR_DEPARTMENT_OTHER)
Admission: EM | Admit: 2016-07-09 | Discharge: 2016-07-12 | DRG: 292 | Disposition: A | Discharge status: Home Health | Payer: Medicare Other | Attending: Internal Medicine | Admitting: Internal Medicine

## 2016-07-09 ENCOUNTER — Encounter (HOSPITAL_BASED_OUTPATIENT_CLINIC_OR_DEPARTMENT_OTHER): Payer: Self-pay | Admitting: Emergency Medicine

## 2016-07-09 ENCOUNTER — Emergency Department (HOSPITAL_BASED_OUTPATIENT_CLINIC_OR_DEPARTMENT_OTHER): Payer: Medicare Other

## 2016-07-09 DIAGNOSIS — I5033 Acute on chronic diastolic (congestive) heart failure: Secondary | ICD-10-CM | POA: Diagnosis not present

## 2016-07-09 DIAGNOSIS — M7989 Other specified soft tissue disorders: Secondary | ICD-10-CM | POA: Diagnosis not present

## 2016-07-09 DIAGNOSIS — M25512 Pain in left shoulder: Secondary | ICD-10-CM | POA: Diagnosis not present

## 2016-07-09 DIAGNOSIS — I110 Hypertensive heart disease with heart failure: Principal | ICD-10-CM | POA: Diagnosis present

## 2016-07-09 DIAGNOSIS — I350 Nonrheumatic aortic (valve) stenosis: Secondary | ICD-10-CM

## 2016-07-09 DIAGNOSIS — E039 Hypothyroidism, unspecified: Secondary | ICD-10-CM | POA: Diagnosis present

## 2016-07-09 DIAGNOSIS — Z9981 Dependence on supplemental oxygen: Secondary | ICD-10-CM

## 2016-07-09 DIAGNOSIS — I10 Essential (primary) hypertension: Secondary | ICD-10-CM | POA: Diagnosis present

## 2016-07-09 DIAGNOSIS — Z79899 Other long term (current) drug therapy: Secondary | ICD-10-CM

## 2016-07-09 DIAGNOSIS — F329 Major depressive disorder, single episode, unspecified: Secondary | ICD-10-CM | POA: Diagnosis not present

## 2016-07-09 DIAGNOSIS — R262 Difficulty in walking, not elsewhere classified: Secondary | ICD-10-CM | POA: Diagnosis not present

## 2016-07-09 DIAGNOSIS — Z881 Allergy status to other antibiotic agents status: Secondary | ICD-10-CM | POA: Diagnosis not present

## 2016-07-09 DIAGNOSIS — Z853 Personal history of malignant neoplasm of breast: Secondary | ICD-10-CM

## 2016-07-09 DIAGNOSIS — Z7982 Long term (current) use of aspirin: Secondary | ICD-10-CM | POA: Diagnosis not present

## 2016-07-09 DIAGNOSIS — J9611 Chronic respiratory failure with hypoxia: Secondary | ICD-10-CM | POA: Diagnosis present

## 2016-07-09 DIAGNOSIS — H3530 Unspecified macular degeneration: Secondary | ICD-10-CM | POA: Diagnosis not present

## 2016-07-09 DIAGNOSIS — I5023 Acute on chronic systolic (congestive) heart failure: Secondary | ICD-10-CM | POA: Diagnosis not present

## 2016-07-09 DIAGNOSIS — I509 Heart failure, unspecified: Secondary | ICD-10-CM | POA: Diagnosis not present

## 2016-07-09 DIAGNOSIS — L03319 Cellulitis of trunk, unspecified: Secondary | ICD-10-CM | POA: Diagnosis not present

## 2016-07-09 DIAGNOSIS — Z880 Allergy status to penicillin: Secondary | ICD-10-CM | POA: Diagnosis not present

## 2016-07-09 DIAGNOSIS — I252 Old myocardial infarction: Secondary | ICD-10-CM

## 2016-07-09 DIAGNOSIS — L0390 Cellulitis, unspecified: Secondary | ICD-10-CM | POA: Diagnosis present

## 2016-07-09 DIAGNOSIS — Z885 Allergy status to narcotic agent status: Secondary | ICD-10-CM | POA: Diagnosis not present

## 2016-07-09 DIAGNOSIS — F419 Anxiety disorder, unspecified: Secondary | ICD-10-CM | POA: Diagnosis present

## 2016-07-09 DIAGNOSIS — M6281 Muscle weakness (generalized): Secondary | ICD-10-CM | POA: Diagnosis not present

## 2016-07-09 DIAGNOSIS — M545 Low back pain: Secondary | ICD-10-CM | POA: Diagnosis not present

## 2016-07-09 DIAGNOSIS — M25551 Pain in right hip: Secondary | ICD-10-CM | POA: Diagnosis not present

## 2016-07-09 DIAGNOSIS — M25561 Pain in right knee: Secondary | ICD-10-CM | POA: Diagnosis not present

## 2016-07-09 DIAGNOSIS — M79609 Pain in unspecified limb: Secondary | ICD-10-CM | POA: Diagnosis not present

## 2016-07-09 DIAGNOSIS — L03119 Cellulitis of unspecified part of limb: Secondary | ICD-10-CM | POA: Diagnosis not present

## 2016-07-09 DIAGNOSIS — Z66 Do not resuscitate: Secondary | ICD-10-CM | POA: Diagnosis present

## 2016-07-09 LAB — CBC WITH DIFFERENTIAL/PLATELET
BASOS ABS: 0 10*3/uL (ref 0.0–0.1)
Basophils Relative: 0 %
EOS ABS: 0.1 10*3/uL (ref 0.0–0.7)
Eosinophils Relative: 2 %
HEMATOCRIT: 35.3 % — AB (ref 36.0–46.0)
HEMOGLOBIN: 11.4 g/dL — AB (ref 12.0–15.0)
LYMPHS ABS: 1.6 10*3/uL (ref 0.7–4.0)
LYMPHS PCT: 29 %
MCH: 30.8 pg (ref 26.0–34.0)
MCHC: 32.3 g/dL (ref 30.0–36.0)
MCV: 95.4 fL (ref 78.0–100.0)
MONOS PCT: 15 %
Monocytes Absolute: 0.8 10*3/uL (ref 0.1–1.0)
Neutro Abs: 3 10*3/uL (ref 1.7–7.7)
Neutrophils Relative %: 54 %
RBC: 3.7 MIL/uL — AB (ref 3.87–5.11)
RDW: 15 % (ref 11.5–15.5)
WBC: 5.5 10*3/uL (ref 4.0–10.5)

## 2016-07-09 LAB — BASIC METABOLIC PANEL
ANION GAP: 12 (ref 5–15)
BUN: 26 mg/dL — ABNORMAL HIGH (ref 6–20)
CALCIUM: 9.4 mg/dL (ref 8.9–10.3)
CO2: 29 mmol/L (ref 22–32)
Chloride: 97 mmol/L — ABNORMAL LOW (ref 101–111)
Creatinine, Ser: 1.17 mg/dL — ABNORMAL HIGH (ref 0.44–1.00)
GFR calc Af Amer: 43 mL/min — ABNORMAL LOW (ref >60)
GFR, EST NON AFRICAN AMERICAN: 38 mL/min — AB (ref >60)
Glucose, Bld: 92 mg/dL (ref 65–99)
POTASSIUM: 3.8 mmol/L (ref 3.5–5.1)
SODIUM: 138 mmol/L (ref 135–145)

## 2016-07-09 LAB — D-DIMER, QUANTITATIVE (NOT AT ARMC): D DIMER QUANT: 1.11 ug{FEU}/mL — AB (ref 0.00–0.50)

## 2016-07-09 LAB — BRAIN NATRIURETIC PEPTIDE: B NATRIURETIC PEPTIDE 5: 413.8 pg/mL — AB (ref 0.0–100.0)

## 2016-07-09 LAB — TROPONIN I: TROPONIN I: 0.03 ng/mL — AB (ref <0.03)

## 2016-07-09 MED ORDER — NAPHAZOLINE-GLYCERIN 0.012-0.2 % OP SOLN
1.0000 [drp] | Freq: Four times a day (QID) | OPHTHALMIC | Status: DC | PRN
  Filled 2016-07-09: qty 15

## 2016-07-09 MED ORDER — ENOXAPARIN SODIUM 30 MG/0.3ML ~~LOC~~ SOLN
30.0000 mg | Freq: Every day | SUBCUTANEOUS | Status: DC
  Administered 2016-07-10 – 2016-07-11 (×3): 30 mg via SUBCUTANEOUS
  Filled 2016-07-09 (×3): qty 0.3

## 2016-07-09 MED ORDER — SODIUM CHLORIDE 0.9% FLUSH: 3.0000 mL | INTRAVENOUS | Status: DC | PRN

## 2016-07-09 MED ORDER — ACETAMINOPHEN 325 MG PO TABS
650.0000 mg | ORAL_TABLET | ORAL | Status: DC | PRN
  Administered 2016-07-12: 650 mg via ORAL
  Filled 2016-07-09 (×2): qty 2

## 2016-07-09 MED ORDER — FUROSEMIDE 10 MG/ML IJ SOLN
80.0000 mg | Freq: Once | INTRAMUSCULAR | Status: AC
  Administered 2016-07-09: 80 mg via INTRAVENOUS
  Filled 2016-07-09: qty 8

## 2016-07-09 MED ORDER — DOXYCYCLINE HYCLATE 100 MG PO TABS
100.0000 mg | ORAL_TABLET | Freq: Two times a day (BID) | ORAL | Status: DC
  Filled 2016-07-09: qty 1

## 2016-07-09 MED ORDER — POTASSIUM CHLORIDE ER 10 MEQ PO TBCR
10.0000 meq | EXTENDED_RELEASE_TABLET | Freq: Every day | ORAL | Status: DC
  Administered 2016-07-10: 10 meq via ORAL
  Filled 2016-07-09 (×3): qty 1

## 2016-07-09 MED ORDER — SODIUM CHLORIDE 0.9% FLUSH
3.0000 mL | Freq: Two times a day (BID) | INTRAVENOUS | Status: DC
  Administered 2016-07-10 – 2016-07-11 (×4): 3 mL via INTRAVENOUS

## 2016-07-09 MED ORDER — LEVOTHYROXINE SODIUM 75 MCG PO TABS
75.0000 ug | ORAL_TABLET | Freq: Every day | ORAL | Status: DC
  Administered 2016-07-10 – 2016-07-12 (×3): 75 ug via ORAL
  Filled 2016-07-09 (×3): qty 1

## 2016-07-09 MED ORDER — ACETAMINOPHEN 500 MG PO TABS
1000.0000 mg | ORAL_TABLET | Freq: Once | ORAL | Status: AC
  Administered 2016-07-09: 1000 mg via ORAL
  Filled 2016-07-09: qty 2

## 2016-07-09 MED ORDER — ASPIRIN EC 81 MG PO TBEC
81.0000 mg | DELAYED_RELEASE_TABLET | ORAL | Status: DC
  Administered 2016-07-10 – 2016-07-12 (×2): 81 mg via ORAL
  Filled 2016-07-09 (×3): qty 1

## 2016-07-09 MED ORDER — ONDANSETRON HCL 4 MG/2ML IJ SOLN: 4.0000 mg | Freq: Four times a day (QID) | INTRAMUSCULAR | Status: DC | PRN

## 2016-07-09 MED ORDER — AMLODIPINE BESYLATE 2.5 MG PO TABS: 2.5000 mg | ORAL_TABLET | Freq: Every day | ORAL | Status: DC

## 2016-07-09 MED ORDER — FUROSEMIDE 10 MG/ML IJ SOLN
40.0000 mg | Freq: Two times a day (BID) | INTRAMUSCULAR | Status: DC
  Administered 2016-07-10 (×2): 40 mg via INTRAVENOUS
  Filled 2016-07-09 (×2): qty 4

## 2016-07-09 MED ORDER — SODIUM CHLORIDE 0.9 % IV SOLN: 250.0000 mL | INTRAVENOUS | Status: DC | PRN

## 2016-07-09 NOTE — ED Notes (Signed)
Pt alert, NAD, calm, interactive, resps e/u, speaking in clear complete sentences, VSS, (denies: pain, sob, nausea, dizziness or other sx).

## 2016-07-09 NOTE — ED Triage Notes (Signed)
Pt has bilateral leg redness and swelling for over 2 weeks.  Pt seen by cardiology and they increased her lasix but pt states it is not improving.

## 2016-07-09 NOTE — ED Notes (Signed)
Patient transported to X-ray 

## 2016-07-09 NOTE — Progress Notes (Signed)
Patient arrival to 3E08 from [facility] transport.  Patient alert and oriented in NAD.  Clear speech with appropriate responses.  Adequate ROM and MOE x4.  Patient denies any pain, discomfort, or disturbance at this time.  Skin intact with noted BLE redness, swelling and warmth.  Family present at bedside.  [facility] Provider notified of patient arrival.  Admission assessment initiated and ongoing.  Will continue to monitor.  Dr. Roel Cluck aware and will follow up with admission evaluation and process accordingly.  Patient and family informed.

## 2016-07-09 NOTE — ED Notes (Signed)
Attempted report 

## 2016-07-09 NOTE — Plan of Care (Signed)
82 yo W CHF and aortic stenosis followed by [name]. Have bilateral leg swelling redness not responding to Lasix, on chronic oxygen at 2L but desets to mid 80 with activities. Cr up to 1.7.  Unsure I cellulitis refused antibiotics. Her weight has went up but also her cr. Cardiology atempted to titrate up lasix but no improvement possible fluid overload state. CXR not confirming CHF Asked to get a d.dimer and trop  Accepted to tele obs  [place] 6:48 PM

## 2016-07-09 NOTE — H&P (Signed)
Kathy Hoover [id number] DOB: [birth year] DOA: 07/09/2016     PCP: Anthoney Harada, MD   Outpatient Specialists: [name]  Patient coming from: From facility [facility]  Chief Complaint: Leg swelling  HPI: Kathy Hoover is a 82 y.o. female with medical history significant of hypothyroidism, hypertension, moderate AS and chronic diastolic heart failure. Chronic leg edema, macular degeneration    Presented with progressive bilateral leg swelling. Legs has been swelling for at least a month. She's been seen for this by cardiology and they have increased her Lasix to 80 mg for 3 days but did not improve anything metolazone was added every other day seem to help the swelling and that caused her to have nausea and diarrhea and she stopped it  Patient have had falls last time being in July CT scan at that time show no acute fractures or intracranial findings. Await has been going up in July she was 168 pounds she has been seen by cardiology a few days ago and her weight was up to 172 pounds of note today way down to 166 lb He was seen by cardiology on 23rd of August. With plan to continue Lasix 80 mg a day and being seen in the stool weeks. There was a concern whether patient has mild cellulitis at first cardiology recommended starting doxycycline but the family declined because of concern the patient developing reactions to antibiotics she has been gated with diet and has not been eating any salty foods. Despite increasing her Lasix she still has bilateral lower extremity edema and redness which was concerning to her surgery she went to emergency department. She reports the swelling he said that she cannot fit her compression stockings anymore Patient on home oxygen as needed she has a pulse oximeter at home which she checks regularly. Recently patient has been doing well no shortness of breath no chest pain. deneis any fever.   Regarding pertinent Chronic problems:   Regarding CHF Echocardiogram obtained in 02/2014 showed moderate AS with mean gradient 28 mmHg. Repeat echocardiogram in July 2016 showed normal LV function, mild AF, moderate AI, mild MS, PA peak pressure 45 mmHg. Given her advanced age, plan for conservative therapy, no plan for repeat echocardiogram based on Dr. Jacalyn Lefevre office visit in February 2017. Of note in 2013 patient had CT of the chest done that showed small airway disease mild chronic lung disease is scattered atelectasis and groundglass opacities  IN ER:  Temp (24hrs), Avg:98.4 F (36.9 C), Min:98.1 F (36.7 C), Max:98.7 F (37.1 C)      Between 90-93 percentile on 2 L heart rate 61 but pressure 129/67 troponin 0.03 which is improved from baseline Na 138 Cr 1.17 GFR 38.around her baseline  Hemoglobin 11.4 BNP 413 D.dimer 1.11 CXR negative  Following Medications were ordered in ER: Medications  furosemide (LASIX) injection 80 mg (80 mg Intravenous Given 07/09/16 1655)  acetaminophen (TYLENOL) tablet 1,000 mg (1,000 mg Oral Given 07/09/16 1746)    since her admission her leg swelling has gone down.   Hospitalist was called for admission for Mild cellulitis possible fluid overload  Review of Systems:    Pertinent positives include:  Bilateral lower extremity  Constitutional:  No weight loss, night sweats, Fevers, chills, fatigue, weight loss  HEENT:  No headaches, Difficulty swallowing,Tooth/dental problems,Sore throat,  No sneezing, itching, ear ache, nasal congestion, post nasal drip,  Cardio-vascular:  No chest pain, Orthopnea, PND, anasarca, dizziness, palpitations.no swelling  GI:  No heartburn, indigestion, abdominal  pain, nausea, vomiting, diarrhea, change in bowel habits, loss of appetite, melena, blood in stool, hematemesis Resp:  no shortness of breath at rest. No dyspnea on exertion, No excess mucus, no productive cough, No non-productive cough, No coughing up of blood.No change in color of mucus.No  wheezing. Skin:  no rash or lesions. No jaundice GU:  no dysuria, change in color of urine, no urgency or frequency. No straining to urinate.  No flank pain.  Musculoskeletal:  No joint pain or no joint swelling. No decreased range of motion. No back pain.  Psych:  No change in mood or affect. No depression or anxiety. No memory loss.  Neuro: no localizing neurological complaints, no tingling, no weakness, no double vision, no gait abnormality, no slurred speech, no confusion  As per HPI otherwise 10 point review of systems negative.   Past Medical History: Past Medical History:  Diagnosis Date  . Anxiety   . Anxiety and depression   . Arthritis    "terribly in my hands" (01/13/2014)  . Bacterial pneumonia 01/13/2014  . Breast cancer ([facility])   . Cervical disc disease    W radiculopathy S/P epidural injections  . Chronic bronchitis ([place])    "@ least once a year;  in the winter" (01/13/2014)  . Chronic diastolic CHF (congestive heart failure) ([facility])    Preserved EF  . Depression   . Exertional shortness of breath    "worse recently" (01/13/2014)  . Hypertension   . Hypothyroidism   . Lower extremity edema    CHRONIC  . Macular degeneration    Dr Hillis Range Follows for pt.  . Moderate aortic stenosis 05/2012   by ECHO  . Pneumonia    "once before today" (01/13/2014)  . Vitamin D deficiency    Past Surgical History:  Procedure Laterality Date  . ABDOMINAL HYSTERECTOMY    . APPENDECTOMY  1949  . BREAST BIOPSY Right ~ 2005  . BREAST LUMPECTOMY Right ~ 2005  . CATARACT EXTRACTION W/ INTRAOCULAR LENS  IMPLANT, BILATERAL Bilateral ~ 2008  . TONSILLECTOMY  1949     Social History:  Ambulatory   Independently     reports that she has never smoked. She has never used smokeless tobacco. She reports that she does not drink alcohol or use drugs.  Allergies:   Allergies  Allergen Reactions  . Penicillins Swelling    Lip swelling Lip swelling  . Ciprofloxacin Nausea And Vomiting    . Codeine Swelling    Lip Swelling  . Metronidazole Itching       Family History:   Family History  Problem Relation Age of Onset  . CVA Father   . Pneumonia Father   . Multiple sclerosis Brother   . CVA Brother     Medications: Prior to Admission medications   Medication Sig Start Date End Date Taking? Authorizing Provider  acetaminophen (TYLENOL) 325 MG tablet Take 2 tablets (650 mg total) by mouth every 6 (six) hours as needed (pain or Fever >/= 101). 01/18/14   Modena Jansky, MD  amLODipine (NORVASC) 2.5 MG tablet Take 1 tablet (2.5 mg total) by mouth daily. 12/28/15   Lelon Perla, MD  aspirin 81 MG EC tablet Take 1 tablet (81 mg total) by mouth every other day. 06/28/15   Sueanne Margarita, MD  Cholecalciferol 2000 units CAPS Take 2,000 Units by mouth every evening.    Historical Provider, MD  furosemide (LASIX) 40 MG tablet Take 2 tablets (80 mg total)  by mouth daily. 07/04/16   Almyra Deforest, PA  levothyroxine (SYNTHROID, LEVOTHROID) 75 MCG tablet Take 1 tablet (75 mcg total) by mouth daily before breakfast. 12/28/15   Lelon Perla, MD  lidocaine (LIDODERM) 5 % Place 1 patch onto the skin daily. Remove & Discard patch within 12 hours or as directed by MD 05/29/16   Harvel Quale, MD  Multiple Vitamin (MULTIVITAMIN WITH MINERALS) TABS Take 1 tablet by mouth as needed (vitamin supplement).     Historical Provider, MD  Multiple Vitamins-Minerals (PRESERVISION AREDS 2 PO) Take 1 tablet by mouth daily.     Historical Provider, MD  nitroGLYCERIN (NITROSTAT) 0.4 MG SL tablet Place 1 tablet (0.4 mg total) under the tongue every 5 (five) minutes as needed for chest pain. 06/13/15   Sueanne Margarita, MD  potassium chloride (K-DUR) 10 MEQ tablet Take 1 tablet (10 mEq total) by mouth daily. 12/28/15   Lelon Perla, MD  Probiotic Product (SOLUBLE FIBER/PROBIOTICS PO) Take 1 tablet by mouth daily.    Historical Provider, MD  vitamin C (ASCORBIC ACID) 500 MG tablet Take 500 mg by mouth  daily.    Historical Provider, MD    Physical Exam: Patient Vitals for the past 24 hrs:  BP Temp Temp src Pulse Resp SpO2 Height Weight  07/09/16 2100 (!) 142/51 98.1 F (36.7 C) Oral - (!) 21 97 % 4\' 11"  (1.499 m) 75.6 kg (166 lb 11.2 oz)  07/09/16 1959 - - - 60 21 90 % - -  07/09/16 1945 - - - (!) 59 15 93 % - -  07/09/16 1900 148/61 - - 61 15 93 % - -  07/09/16 1808 138/62 - - 63 20 95 % - -  07/09/16 1721 - - - - - 93 % - -  07/09/16 1700 128/65 - - 63 24 93 % - -  07/09/16 1545 134/58 - - 61 - 95 % - -  07/09/16 1530 128/63 - - (!) 59 - 93 % - -  07/09/16 1515 127/61 - - (!) 59 - 96 % - -  07/09/16 1505 - - - - - - 4\' 11"  (1.499 m) 78 kg (172 lb)  07/09/16 1502 129/67 98.7 F (37.1 C) Oral 61 16 93 % - -    1. General:  in No Acute distress 2. Psychological: Alert and   Oriented 3. Head/ENT:   Moist Mucous Membranes                          Head Non traumatic, neck supple                           Poor Dentition 4. SKIN: normal   Skin turgor,  Skin clean Dry Redness and did overload bilateral lower extremity 5. Heart: Regular rate and rhythm   Murmur, Rub or gallop 6. Lungs:   no wheezes occasional crackles   7. Abdomen: Soft,  non-tender, Non distended 8. Lower extremities: no clubbing, cyanosis, 1+ edema improved from prior 9. Neurologically Grossly intact, moving all 4 extremities equally  10. MSK: Normal range of motion   body mass index is 33.67 kg/m.  Labs on Admission:   Labs on Admission: I have personally reviewed following labs and imaging studies  CBC:  Recent Labs Lab 07/04/16 1229 07/09/16 1631  WBC 6.0 5.5  NEUTROABS 3,360 3.0  HGB 11.2* 11.4*  HCT 34.0* 35.3*  MCV 93.4 95.4  PLT 161 PLATELET COUNT CONFIRMED BY SMEAR   Basic Metabolic Panel:  Recent Labs Lab 07/04/16 1229 07/09/16 1631  NA 138 138  K 4.5 3.8  CL 100 97*  CO2 28 29  GLUCOSE 88 92  BUN 24 26*  CREATININE 1.00* 1.17*  CALCIUM 9.5 9.4   GFR: Estimated Creatinine  Clearance: 23.8 mL/min (by C-G formula based on SCr of 1.17 mg/dL). Liver Function Tests: No results for input(s): AST, ALT, ALKPHOS, BILITOT, PROT, ALBUMIN in the last 168 hours. No results for input(s): LIPASE, AMYLASE in the last 168 hours. No results for input(s): AMMONIA in the last 168 hours. Coagulation Profile: No results for input(s): INR, PROTIME in the last 168 hours. Cardiac Enzymes:  Recent Labs Lab 07/09/16 1855  TROPONINI 0.03*   BNP (last 3 results) No results for input(s): PROBNP in the last 8760 hours. HbA1C: No results for input(s): HGBA1C in the last 72 hours. CBG: No results for input(s): GLUCAP in the last 168 hours. Lipid Profile: No results for input(s): CHOL, HDL, LDLCALC, TRIG, CHOLHDL, LDLDIRECT in the last 72 hours. Thyroid Function Tests: No results for input(s): TSH, T4TOTAL, FREET4, T3FREE, THYROIDAB in the last 72 hours. Anemia Panel: No results for input(s): VITAMINB12, FOLATE, FERRITIN, TIBC, IRON, RETICCTPCT in the last 72 hours. Urine analysis: No results found for: COLORURINE, APPEARANCEUR, LABSPEC, PHURINE, GLUCOSEU, HGBUR, BILIRUBINUR, KETONESUR, PROTEINUR, UROBILINOGEN, NITRITE, LEUKOCYTESUR Sepsis Labs: @LABRCNTIP (procalcitonin:4,lacticidven:4) )No results found for this or any previous visit (from the past 240 hour(s)).     UA  not ordered  No results found for: HGBA1C  Estimated Creatinine Clearance: 23.8 mL/min (by C-G formula based on SCr of 1.17 mg/dL).  BNP (last 3 results) No results for input(s): PROBNP in the last 8760 hours.   ECG REPORT ordered  [facility] Weights   07/09/16 1505 07/09/16 2100  Weight: 78 kg (172 lb) 75.6 kg (166 lb 11.2 oz)     Cultures:    Component Value Date/Time   SDES BLOOD LEFT HAND 01/13/2014 1400   SPECREQUEST BOTTLES DRAWN AEROBIC ONLY 5CC 01/13/2014 1400   CULT  01/13/2014 1400    NO GROWTH 5 DAYS Performed at [facility] 01/19/2014 FINAL 01/13/2014 1400       Radiological Exams on Admission: Dg Chest 2 View  Result Date: 07/09/2016 CLINICAL DATA:  Bilateral lower extremity swelling. EXAM: CHEST  2 VIEW COMPARISON:  Radiographs of December 03, 2015. FINDINGS: Stable cardiomegaly. Right axillary surgical clips are noted. No pneumothorax or pleural effusion is noted. No acute pulmonary disease is noted. Bony thorax is unremarkable. IMPRESSION: No active cardiopulmonary disease. Electronically Signed   By: Marijo Conception, M.D.   On: 07/09/2016 16:10    Chart has been reviewed    Assessment/Plan  82 y.o. female with medical history significant of hypothyroidism, hypertension, moderate AS and chronic diastolic heart failure. Chronic leg edema, macular degeneration admitted for IV diuretics for fluid overload and mild cellulitis currently seem to be improving after given IV Lasix   Present on Admission: . Essential hypertension, benign stable continue home medications . Cellulitis - mild would treat with doxycycline patient probably benefit from TED hose if able to tolerate right now patient states she still cannot will try again tomorrow . Acute on chronic diastolic CHF (congestive heart failure) ([place]) - - admit on telemetry, cycle cardiac enzymes, obtain serial ECG, to evaluate for ischemia as a cause of heart failure  monitor daily weight  diurese  with IV lasix and monitor orthostatics and creatinine to avoid over diuresis.  Order echogram to evaluate EF and valves  patient is not on ACE/ARBi ) chronic kidney disease and diastolic dysfunction   cardiology consult    Other plan as per orders.  DVT prophylaxis:    Lovenox     Code Status:    DNR/DNI   as per patient   Family Communication:   Family not  at  Bedside    Disposition Plan:                             Back to current facility when stable                              Would benefit from PT/OT eval prior to DC  ordered                                               Consults  called: emailed cardiology  Admission status:  obs   Level of care  tele      I have spent a total of 54 min on this admission     Aleric Froelich 07/09/2016, 11:10 PM    [facility]  Pager [phone]   after 2 AM please page floor coverage PA If 7AM-7PM, please contact the day team taking care of the patient  [email or web address]  Password [email or web address]

## 2016-07-09 NOTE — ED Provider Notes (Addendum)
[facility] Provider Note   CSN: [id number] Arrival date & time: 07/09/16  1455  By signing my name below, I, [name], attest that this documentation has been prepared under the direction and in the presence of Malvin Johns, MD. Electronically Signed: [name], ED Scribe. 07/09/16. 4:05 PM.   History   Chief Complaint Chief Complaint  Patient presents with  . Leg Swelling   The history is provided by the patient. No language interpreter was used.   HPI Comments:  Kathy Hoover is a 82 y.o. female with a history of HTN, chronic diastolic heart failure, acquired stenosis of aortic valve and NSTEMI who presents to the Emergency Department complaining of gradually worsening edema in bilateral lower extremities, which started 1 month ago. Swelling occurs from ankles to below the knees. Associated symptoms include redness and warmth. She has been wearing compression stockings for a year, but cannot fit them over legs now. Pt was seen by cardiologist last week and they increased dosage of Lasix from 40 to 80 mg, which did not provide relief. Denies fever, SOB, chest pain, nausea and vomiting. She is scheduled to see cardiologist in the next couple of weeks.  She does were home oxygen on an as needed basis.   Past Medical History:  Diagnosis Date  . Anxiety   . Anxiety and depression   . Arthritis    "terribly in my hands" (01/13/2014)  . Bacterial pneumonia 01/13/2014  . Breast cancer ([place])   . Cervical disc disease    W radiculopathy S/P epidural injections  . Chronic bronchitis ([place])    "@ least once a year;  in the winter" (01/13/2014)  . Chronic diastolic CHF (congestive heart failure) (HCC)    Preserved EF  . Depression   . Exertional shortness of breath    "worse recently" (01/13/2014)  . Hypertension   . Hypothyroidism   . Lower extremity edema    CHRONIC  . Macular degeneration    Dr Hillis Range Follows for pt.  . Moderate aortic stenosis 05/2012   by ECHO  .  Pneumonia    "once before today" (01/13/2014)  . Vitamin D deficiency     Patient Active Problem List   Diagnosis Date Noted  . CHF (congestive heart failure) ([place]) 07/09/2016  . NSTEMI (non-ST elevated myocardial infarction) ([place]) 06/05/2015  . Onychomycosis 12/22/2014  . Ingrown nail 12/22/2014  . Pain in lower limb 12/22/2014  . SOB (shortness of breath) 07/28/2014  . Hypoxia 01/13/2014  . Chronic diastolic heart failure ([place]) 01/13/2014  . Obesity, unspecified 01/13/2014  . Influenza with pneumonia 01/13/2014  . Essential hypertension, benign 12/22/2013  . Acquired stenosis of aortic valve 12/22/2013  . Encounter for long-term (current) use of other medications 12/22/2013    Past Surgical History:  Procedure Laterality Date  . ABDOMINAL HYSTERECTOMY    . APPENDECTOMY  1949  . BREAST BIOPSY Right ~ 2005  . BREAST LUMPECTOMY Right ~ 2005  . CATARACT EXTRACTION W/ INTRAOCULAR LENS  IMPLANT, BILATERAL Bilateral ~ 2008  . TONSILLECTOMY  1949    OB History    No data available       Home Medications    Prior to Admission medications   Medication Sig Start Date End Date Taking? Authorizing Provider  acetaminophen (TYLENOL) 325 MG tablet Take 2 tablets (650 mg total) by mouth every 6 (six) hours as needed (pain or Fever >/= 101). 01/18/14   Modena Jansky, MD  amLODipine (NORVASC) 2.5 MG  tablet Take 1 tablet (2.5 mg total) by mouth daily. 12/28/15   Lelon Perla, MD  aspirin 81 MG EC tablet Take 1 tablet (81 mg total) by mouth every other day. 06/28/15   Sueanne Margarita, MD  Cholecalciferol 2000 units CAPS Take 2,000 Units by mouth every evening.    Historical Provider, MD  furosemide (LASIX) 40 MG tablet Take 2 tablets (80 mg total) by mouth daily. 07/04/16   Almyra Deforest, PA  levothyroxine (SYNTHROID, LEVOTHROID) 75 MCG tablet Take 1 tablet (75 mcg total) by mouth daily before breakfast. 12/28/15   Lelon Perla, MD  lidocaine (LIDODERM) 5 % Place 1 patch onto the skin  daily. Remove & Discard patch within 12 hours or as directed by MD 05/29/16   Harvel Quale, MD  Multiple Vitamin (MULTIVITAMIN WITH MINERALS) TABS Take 1 tablet by mouth as needed (vitamin supplement).     Historical Provider, MD  Multiple Vitamins-Minerals (PRESERVISION AREDS 2 PO) Take 1 tablet by mouth daily.     Historical Provider, MD  nitroGLYCERIN (NITROSTAT) 0.4 MG SL tablet Place 1 tablet (0.4 mg total) under the tongue every 5 (five) minutes as needed for chest pain. 06/13/15   Sueanne Margarita, MD  potassium chloride (K-DUR) 10 MEQ tablet Take 1 tablet (10 mEq total) by mouth daily. 12/28/15   Lelon Perla, MD  Probiotic Product (SOLUBLE FIBER/PROBIOTICS PO) Take 1 tablet by mouth daily.    Historical Provider, MD  vitamin C (ASCORBIC ACID) 500 MG tablet Take 500 mg by mouth daily.    Historical Provider, MD    Family History Family History  Problem Relation Age of Onset  . CVA Father   . Pneumonia Father   . Multiple sclerosis Brother   . CVA Brother     Social History Social History  Substance Use Topics  . Smoking status: Never Smoker  . Smokeless tobacco: Never Used  . Alcohol use No     Allergies   Penicillins; Ciprofloxacin; Codeine; and Metronidazole   Review of Systems Review of Systems  Constitutional: Negative for chills, diaphoresis, fatigue and fever.  HENT: Negative for congestion, rhinorrhea and sneezing.   Eyes: Negative.   Respiratory: Negative for cough, chest tightness and shortness of breath.   Cardiovascular: Positive for leg swelling. Negative for chest pain.  Gastrointestinal: Negative for abdominal pain, blood in stool, diarrhea, nausea and vomiting.  Genitourinary: Negative for difficulty urinating, flank pain, frequency and hematuria.  Musculoskeletal: Negative for arthralgias and back pain.  Skin: Positive for color change ( redness and warmth in bilateral legs). Negative for rash.  Neurological: Negative for dizziness, speech  difficulty, weakness, numbness and headaches.     Physical Exam Updated Vital Signs BP 138/62   Pulse 63   Temp 98.7 F (37.1 C) (Oral)   Resp 20   Ht 4\' 11"  (1.499 m)   Wt 172 lb (78 kg)   SpO2 95%   BMI 34.74 kg/m   Physical Exam  Constitutional: She is oriented to person, place, and time. She appears well-developed and well-nourished.  HENT:  Head: Normocephalic and atraumatic.  Eyes: Pupils are equal, round, and reactive to light.  Neck: Normal range of motion. Neck supple.  Cardiovascular: Normal rate and regular rhythm.   Murmur heard. 3+ to 4+ pitting edema bilaterally with overlying warmth and erythema. Pedal pulses intact.  Pulmonary/Chest: Effort normal. No respiratory distress. She has no wheezes. She has no rales. She exhibits no tenderness.  Crackles in  base of lungs.  Abdominal: Soft. Bowel sounds are normal. There is no tenderness. There is no rebound and no guarding.  Musculoskeletal: Normal range of motion. She exhibits no edema.  Lymphadenopathy:    She has no cervical adenopathy.  Neurological: She is alert and oriented to person, place, and time.  Skin: Skin is warm and dry. No rash noted.  Psychiatric: She has a normal mood and affect.    ED Treatments / Results  DIAGNOSTIC STUDIES:  Oxygen Saturation is 93% on room air, normal by my interpretation.    COORDINATION OF CARE:  3:40 PM Discussed treatment plan with pt at bedside, which includes blood work and IV Lasix, and pt agreed to plan.  Labs (all labs ordered are listed, but only abnormal results are displayed) Labs Reviewed  BASIC METABOLIC PANEL - Abnormal; Notable for the following:       Result Value   Chloride 97 (*)    BUN 26 (*)    Creatinine, Ser 1.17 (*)    GFR calc non Af Amer 38 (*)    GFR calc Af Amer 43 (*)    All other components within normal limits  CBC WITH DIFFERENTIAL/PLATELET - Abnormal; Notable for the following:    RBC 3.70 (*)    Hemoglobin 11.4 (*)    HCT  35.3 (*)    All other components within normal limits  BRAIN NATRIURETIC PEPTIDE - Abnormal; Notable for the following:    B Natriuretic Peptide 413.8 (*)    All other components within normal limits  D-DIMER, QUANTITATIVE (NOT AT [facility])  TROPONIN I    EKG  EKG Interpretation None      ED ECG REPORT   Date: 07/09/2016  Rate: 63  Rhythm: normal sinus rhythm  QRS Axis: normal  Intervals: normal  ST/T Wave abnormalities: nonspecific ST/T changes  Conduction Disutrbances:none  Narrative Interpretation:   Old EKG Reviewed: unchanged  I have personally reviewed the EKG tracing and agree with the computerized printout as noted.  Radiology Dg Chest 2 View  Result Date: 07/09/2016 CLINICAL DATA:  Bilateral lower extremity swelling. EXAM: CHEST  2 VIEW COMPARISON:  Radiographs of December 03, 2015. FINDINGS: Stable cardiomegaly. Right axillary surgical clips are noted. No pneumothorax or pleural effusion is noted. No acute pulmonary disease is noted. Bony thorax is unremarkable. IMPRESSION: No active cardiopulmonary disease. Electronically Signed   By: Marijo Conception, M.D.   On: 07/09/2016 16:10    Procedures Procedures (including critical care time)  Medications Ordered in ED Medications  furosemide (LASIX) injection 80 mg (80 mg Intravenous Given 07/09/16 1655)  acetaminophen (TYLENOL) tablet 1,000 mg (1,000 mg Oral Given 07/09/16 1746)     Initial Impression / Assessment and Plan / ED Course  I have reviewed the triage vital signs and the nursing notes.  Pertinent labs & imaging results that were available during my care of the patient were reviewed by me and considered in my medical decision making (see chart for details).  Clinical Course    Pt given IV lasix.  No evidence of pulmonary edema The patient does desat into the mid 80s with minimal movement. I don't know how much of a change this is from her chronic condition. She currently doesn't report any chest pain or  shortness of breath. She does have some warmth and erythema of her lower extremities but I feel this is more likely related to the fluid overload rather than infection. She's afebrile. Her white count is normal.  I spoke with Dr.Doutova who is accepted the patient for transfer to [facility], observation telemetry bed. Dr. Roel Cluck requests that we add a troponin and d-dimer.  19:50 pt's d-dimer elevated.  [facility] is here to transport pt to [facility].  Given her elevated creatinine, would likely be better to have V/Q scan at [facility].  Final Clinical Impressions(s) / ED Diagnoses   Final diagnoses:  Acute on chronic systolic congestive heart failure (HCC)    New Prescriptions New Prescriptions   No medications on file  I personally performed the services described in this documentation, which was scribed in my presence.  The recorded information has been reviewed and considered.     Malvin Johns, MD 07/09/16 [place], MD 07/09/16 [place], MD 07/09/16 1949

## 2016-07-10 ENCOUNTER — Telehealth: Payer: Self-pay | Admitting: Cardiology

## 2016-07-10 ENCOUNTER — Inpatient Hospital Stay (HOSPITAL_COMMUNITY): Payer: Medicare Other

## 2016-07-10 DIAGNOSIS — I5033 Acute on chronic diastolic (congestive) heart failure: Secondary | ICD-10-CM

## 2016-07-10 DIAGNOSIS — I10 Essential (primary) hypertension: Secondary | ICD-10-CM

## 2016-07-10 DIAGNOSIS — E039 Hypothyroidism, unspecified: Secondary | ICD-10-CM

## 2016-07-10 DIAGNOSIS — I350 Nonrheumatic aortic (valve) stenosis: Secondary | ICD-10-CM

## 2016-07-10 DIAGNOSIS — M7989 Other specified soft tissue disorders: Secondary | ICD-10-CM

## 2016-07-10 DIAGNOSIS — M79609 Pain in unspecified limb: Secondary | ICD-10-CM

## 2016-07-10 LAB — COMPREHENSIVE METABOLIC PANEL
ALK PHOS: 85 U/L (ref 38–126)
ALT: 15 U/L (ref 14–54)
ANION GAP: 8 (ref 5–15)
AST: 22 U/L (ref 15–41)
Albumin: 3.4 g/dL — ABNORMAL LOW (ref 3.5–5.0)
BUN: 22 mg/dL — ABNORMAL HIGH (ref 6–20)
CALCIUM: 9.2 mg/dL (ref 8.9–10.3)
CHLORIDE: 99 mmol/L — AB (ref 101–111)
CO2: 34 mmol/L — AB (ref 22–32)
CREATININE: 1.14 mg/dL — AB (ref 0.44–1.00)
GFR, EST AFRICAN AMERICAN: 45 mL/min — AB (ref >60)
GFR, EST NON AFRICAN AMERICAN: 39 mL/min — AB (ref >60)
Glucose, Bld: 102 mg/dL — ABNORMAL HIGH (ref 65–99)
Potassium: 3.8 mmol/L (ref 3.5–5.1)
SODIUM: 141 mmol/L (ref 135–145)
Total Bilirubin: 0.8 mg/dL (ref 0.3–1.2)
Total Protein: 6.4 g/dL — ABNORMAL LOW (ref 6.5–8.1)

## 2016-07-10 LAB — TROPONIN I
TROPONIN I: 0.03 ng/mL — AB (ref <0.03)
Troponin I: <0.03 ng/mL (ref <0.03)
Troponin I: 0.03 ng/mL (ref <0.03)

## 2016-07-10 NOTE — Telephone Encounter (Signed)
New message       Calling to let Dr Stanford Breed know that [facility]rm 3E8.  She is there because of cellulitis in her legs causing fluid to drain.  Son want Dr Stanford Breed to come by and see her if possible.

## 2016-07-10 NOTE — Progress Notes (Signed)
1139 Family at the bedside .Requested for update with MD .Emailed MD and responded personally . Spoken with family and pt

## 2016-07-10 NOTE — Progress Notes (Signed)
PROGRESS NOTE        PATIENT DETAILS Name: Kathy Hoover Age: 82 y.o. Sex: female Date of Birth: August 15, 1916 Admit Date: 07/09/2016 Admitting Physician Toy Baker, MD [id number] PATRICK, MD  Brief Narrative: Patient is a 82 y.o. female with history of chronic diastolic heart failure, chronic hypoxic respiratory failure on home O2, admitted with worsening bilateral lower extremity edema likely secondary to acute on chronic diastolic heart failure. Improving with intravenous diuretics.  Subjective: Leg swelling has already improved, very minimal erythema.  Assessment/Plan: Active Problems: Acute on chronic diastolic heart failure: Mostly presenting with lower extremity edema, edema is improving after initiation of intravenous Lasix. Weight decreased to 163 pounds (166 pounds on admission),-1.3 L so far. Plan is to continue intravenous Lasix for 1 additional day. I do not think she has cellulitis (no fever or leukocytosis)-I think mild erythema is secondary to lower extremity edema, I will go ahead and discontinue empiric antibiotics at this time.  Hypothyroidism: Continue Synthroid  History of hypertension: Hold amlodipine-continue intravenous diuretics. Blood pressure is currently well-controlled. May need to consider switching to an alternate agent-as calcium channel blockers can cause a worsening edema.  Moderate aortic stenosis: Await echo, but doubt it will change management.   DVT Prophylaxis: Prophylactic Lovenox  Code Status:  DNR  Family Communication: None at bedside  Disposition Plan: Remain inpatient-but will plan on Home health-await PT/OT eval  Antimicrobial agents: Doxycycline 8/28>>8/29  Procedures: none  CONSULTS:  None  Time spent: 25  minutes-Greater than 50% of this time was spent in counseling, explanation of diagnosis, planning of further management, and coordination of care.  MEDICATIONS: Anti-infectives    Start     Dose/Rate Route Frequency Ordered Stop   07/09/16 2315  doxycycline (VIBRA-TABS) tablet 100 mg     100 mg Oral Every 12 hours 07/09/16 2308        Scheduled Meds: . aspirin EC  81 mg Oral QODAY  . doxycycline  100 mg Oral Q12H  . enoxaparin (LOVENOX) injection  30 mg Subcutaneous QHS  . furosemide  40 mg Intravenous BID  . levothyroxine  75 mcg Oral QAC breakfast  . potassium chloride  10 mEq Oral Daily  . sodium chloride flush  3 mL Intravenous Q12H   Continuous Infusions:  PRN Meds:.sodium chloride, acetaminophen, naphazoline-glycerin, ondansetron (ZOFRAN) IV, sodium chloride flush   PHYSICAL EXAM: Vital signs: Vitals:   07/10/16 0059 07/10/16 0455 07/10/16 0743 07/10/16 0914  BP: (!) 140/56 (!) 132/48 (!) 129/54 119/79  Pulse: 61 62 (!) 56 60  Resp: 20 20 18 18   Temp: 98.3 F (36.8 C) 98.3 F (36.8 C) 98.8 F (37.1 C) 98.7 F (37.1 C)  TempSrc: Oral Oral Oral Oral  SpO2: 96% 97% 94% 97%  Weight:  74.3 kg (163 lb 14.4 oz)    Height:       Filed Weights   07/09/16 1505 07/09/16 2100 07/10/16 0455  Weight: 78 kg (172 lb) 75.6 kg (166 lb 11.2 oz) 74.3 kg (163 lb 14.4 oz)   Body mass index is 33.1 kg/m.   Gen Exam: Awake and alert with clear speech. Not in any distress  Neck: Supple, No JVD.   Chest: B/L Clear.   CVS: S1 S2 Regular, +3/6 syst murmur Abdomen: soft, BS +, non tender, non distended.  Extremities: ++ edema, lower extremities warm to touch-mild erythema  in bilateral lower extremities Neurologic: Non Focal.  Skin: No Rash or lesions   Wounds: N/A.    I have personally reviewed following labs and imaging studies  LABORATORY DATA: CBC:  Recent Labs Lab 07/04/16 1229 07/09/16 1631  WBC 6.0 5.5  NEUTROABS 3,360 3.0  HGB 11.2* 11.4*  HCT 34.0* 35.3*  MCV 93.4 95.4  PLT 161 PLATELET COUNT CONFIRMED BY SMEAR    Basic Metabolic Panel:  Recent Labs Lab 07/04/16 1229 07/09/16 1631 07/10/16 0516  NA 138 138 141  K 4.5 3.8 3.8  CL  100 97* 99*  CO2 28 29 34*  GLUCOSE 88 92 102*  BUN 24 26* 22*  CREATININE 1.00* 1.17* 1.14*  CALCIUM 9.5 9.4 9.2    GFR: Estimated Creatinine Clearance: 24.2 mL/min (by C-G formula based on SCr of 1.14 mg/dL).  Liver Function Tests:  Recent Labs Lab 07/10/16 0516  AST 22  ALT 15  ALKPHOS 85  BILITOT 0.8  PROT 6.4*  ALBUMIN 3.4*   No results for input(s): LIPASE, AMYLASE in the last 168 hours. No results for input(s): AMMONIA in the last 168 hours.  Coagulation Profile: No results for input(s): INR, PROTIME in the last 168 hours.  Cardiac Enzymes:  Recent Labs Lab 07/09/16 1855 07/09/16 2313 07/10/16 0516  TROPONINI 0.03* <0.03 0.03*    BNP (last 3 results) No results for input(s): PROBNP in the last 8760 hours.  HbA1C: No results for input(s): HGBA1C in the last 72 hours.  CBG: No results for input(s): GLUCAP in the last 168 hours.  Lipid Profile: No results for input(s): CHOL, HDL, LDLCALC, TRIG, CHOLHDL, LDLDIRECT in the last 72 hours.  Thyroid Function Tests: No results for input(s): TSH, T4TOTAL, FREET4, T3FREE, THYROIDAB in the last 72 hours.  Anemia Panel: No results for input(s): VITAMINB12, FOLATE, FERRITIN, TIBC, IRON, RETICCTPCT in the last 72 hours.  Urine analysis: No results found for: COLORURINE, APPEARANCEUR, LABSPEC, PHURINE, GLUCOSEU, HGBUR, BILIRUBINUR, KETONESUR, PROTEINUR, UROBILINOGEN, NITRITE, LEUKOCYTESUR  Sepsis Labs: Lactic Acid, Venous    Component Value Date/Time   LATICACIDVEN 1.94 12/03/2015 1755    MICROBIOLOGY: No results found for this or any previous visit (from the past 240 hour(s)).  RADIOLOGY STUDIES/RESULTS: Dg Chest 2 View  Result Date: 07/09/2016 CLINICAL DATA:  Bilateral lower extremity swelling. EXAM: CHEST  2 VIEW COMPARISON:  Radiographs of December 03, 2015. FINDINGS: Stable cardiomegaly. Right axillary surgical clips are noted. No pneumothorax or pleural effusion is noted. No acute pulmonary disease  is noted. Bony thorax is unremarkable. IMPRESSION: No active cardiopulmonary disease. Electronically Signed   By: Marijo Conception, M.D.   On: 07/09/2016 16:10     LOS: 1 day   Oren Binet, MD  [facility] Pager:[phone]  If 7PM-7AM, please contact night-coverage [email or web address] Password [facility] 07/10/2016, 9:28 AM

## 2016-07-10 NOTE — Evaluation (Signed)
Occupational Therapy Evaluation Patient Details Name: Kathy Hoover MRN: [id number] DOB: 28-May-1916 Today's Date: 07/10/2016    History of Present Illness Kathy Hoover is a 82 y.o. female with medical history significant of hypothyroidism, hypertension, moderate AS and chronic diastolic heart failure. Chronic leg edema, macular degeneration.  Presented with progressive bilateral leg swelling. Legs has been swelling for at least a month.    Clinical Impression   Pt admitted with above. She demonstrates the below listed deficits and will benefit from continued OT to maximize safety and independence with BADLs.  Pt presents to OT with generalized weakness and impaired balance.  She requires min A for ADLs currently, and fatigues quickly with activity.  She does have a hired caregiver intermittently to assist with LB dressing, and transport to and from dining room.  Feel she could return to her independent living facility, but would recommend she initially have increased assist - someone to assist with ADLs and when she is up ambulating.  She voices that she feels she can hire her caregiver for additional time.  Recommend HHOT      Follow Up Recommendations  Home health OT;Supervision - Intermittent    Equipment Recommendations  None recommended by OT    Recommendations for Other Services       Precautions / Restrictions Precautions Precautions: Fall Precaution Comments: 3 falls in last 6 months      Mobility Bed Mobility Overal bed mobility: Needs Assistance Bed Mobility: Sit to Supine       Sit to supine: Mod assist   General bed mobility comments: assist to lift LEs onto bed   Transfers Overall transfer level: Needs assistance Equipment used: Rolling walker (2 wheeled) Transfers: Sit to/from [facility] Sit to Stand: Min guard Stand pivot transfers: Min guard            Balance Overall balance assessment: History of Falls           Standing  balance-Leahy Scale: Poor Standing balance comment: requires UE support                             ADL Overall ADL's : Needs assistance/impaired Eating/Feeding: Independent   Grooming: Wash/dry hands;Wash/dry face;Oral care;Brushing hair;Set up;Sitting   Upper Body Bathing: Set up;Sitting   Lower Body Bathing: Minimal assistance;Sit to/from stand   Upper Body Dressing : Set up;Sitting   Lower Body Dressing: Moderate assistance;Sit to/from stand   Toilet Transfer: Minimal assistance;Ambulation;Comfort height toilet;BSC;RW   Toileting- Clothing Manipulation and Hygiene: Minimal assistance;Sit to/from stand       Functional mobility during ADLs: Minimal assistance;Rolling walker;Min guard General ADL Comments: min A for balance      Vision     Perception     Praxis      Pertinent Vitals/Pain Pain Assessment: No/denies pain     Hand Dominance Right   Extremity/Trunk Assessment Upper Extremity Assessment Upper Extremity Assessment: Generalized weakness   Lower Extremity Assessment Lower Extremity Assessment: Defer to PT evaluation   Cervical / Trunk Assessment Cervical / Trunk Assessment: Kyphotic   Communication Communication Communication: HOH   Cognition Arousal/Alertness: Awake/alert Behavior During Therapy: WFL for tasks assessed/performed Overall Cognitive Status: Within Functional Limits for tasks assessed                     General Comments       Exercises       Shoulder Instructions  Home Living Family/patient expects to be discharged to:: Other (Comment) (Independent living )   Available Help at Discharge: Personal care attendant Type of Home: Independent living facility       Home Layout: One level     Bathroom Shower/Tub: Occupational psychologist: Handicapped height Bathroom Accessibility: Yes How Accessible: Accessible via walker;Accessible via wheelchair Home Equipment: Gilford Rile - 4  wheels;Wheelchair - manual;Shower seat;Grab bars - toilet;Grab bars - tub/shower;Adaptive equipment Adaptive Equipment: Reacher Additional Comments: o2 prn at home as well      Prior Functioning/Environment Level of Independence: Needs assistance  Gait / Transfers Assistance Needed: Normally ambulates around apartment and to meals with rollator, but in last month due to swelling her aide pushes her in her w/c to meals. ADL's / Homemaking Assistance Needed: She reports she showers in sitting, but "it terrifies me".  Aide assists her with donning/doffing compression stockings and shoes.  She reports she uses reacher to don pants           OT Diagnosis: Generalized weakness   OT Problem List: Decreased strength;Decreased activity tolerance;Impaired balance (sitting and/or standing);Decreased safety awareness;Decreased knowledge of use of DME or AE   OT Treatment/Interventions: Self-care/ADL training;Therapeutic exercise;DME and/or AE instruction;Therapeutic activities;Patient/family education;Balance training    OT Goals(Current goals can be found in the care plan section) Acute Rehab OT Goals Patient Stated Goal: To go to the beach Sept 9 with daughter OT Goal Formulation: With patient Time For Goal Achievement: 07/24/16 Potential to Achieve Goals: Good ADL Goals Pt Will Perform Upper Body Bathing: with supervision;sitting Pt Will Perform Lower Body Bathing: with supervision;sit to/from stand Pt Will Perform Upper Body Dressing: with supervision;sitting Pt Will Perform Lower Body Dressing: with adaptive equipment;sit to/from stand;with mod assist Pt Will Transfer to Toilet: with supervision;ambulating;bedside commode;grab bars Pt Will Perform Toileting - Clothing Manipulation and hygiene: with supervision;sit to/from stand  OT Frequency: Min 2X/week   Barriers to D/C:            Co-evaluation              End of Session Equipment Utilized During Treatment: Rolling  walker Nurse Communication: Mobility status  Activity Tolerance: Patient limited by fatigue Patient left: in bed;with call bell/phone within reach;with nursing/sitter in room   Time: [id number] OT Time Calculation (min): 15 min Charges:  OT General Charges $OT Visit: 1 Procedure OT Evaluation $OT Eval Low Complexity: 1 Procedure G-Codes:    Annett Boxwell M 08-04-16, 6:21 PM

## 2016-07-10 NOTE — Progress Notes (Signed)
**  Preliminary report by tech**  Bilateral lower extremity venous duplex completed. There is no evidence of deep or superficial vein thrombosis involving the right and left lower extremities. Technically difficult study due to patient body habitus, pain tolerance, edema, depth of vessel, and acoustic shadowing. Unable to visualize the left peroneal veins. All other visualized vessels appear patent and compressible. There is no evidence of Baker's cysts bilaterally.  07/10/16 4:22 PM Kathy Hoover RVT

## 2016-07-10 NOTE — Progress Notes (Signed)
Pt refused doxycycline. She stated taking antibiotics by mouth makes her sick on her stomach.

## 2016-07-10 NOTE — Care Management Note (Signed)
Case Management Note  Patient Details  Name: Kathy Hoover MRN: [id number] Date of Birth: 03-06-1916  Subjective/Objective:    Admitted with CHF                Action/Plan: Patient lives at an [facility]; awaiting on PT/OT eval to access patient's mobility level. CM will continue to follow for DCP  Expected Discharge Date:   possibly 07/13/2016               Expected Discharge Plan:  Assisted Living / Rest Home  In-House Referral:  Clinical Social Work  Discharge planning Services  CM Consult     Status of Service:  In process, will continue to follow  Sherrilyn Rist [id number] 07/10/2016, 10:35 AM

## 2016-07-10 NOTE — Evaluation (Signed)
Physical Therapy Evaluation Patient Details Name: Kathy Hoover MRN: [id number] DOB: June 23, 1916 Today's Date: 07/10/2016   History of Present Illness  Kathy Hoover is a 82 y.o. female with medical history significant of hypothyroidism, hypertension, moderate AS and chronic diastolic heart failure. Chronic leg edema, macular degeneration.  Presented with progressive bilateral leg swelling. Legs has been swelling for at least a month.   Clinical Impression  Pt admitted with above diagnosis. Pt currently with functional limitations due to the deficits listed below (see PT Problem List). Pt will benefit from skilled PT to increase their independence and safety with mobility to allow discharge to the venue listed below.  Pt fatigues quickly with mobility, but reports she can have her caregiver come more often and A her with mobility.  Recommend S with mobility at this time and HHPT (pt reports [facility] present in her retirement home).     Follow Up Recommendations Home health PT;Supervision for mobility/OOB    Equipment Recommendations  None recommended by PT    Recommendations for Other Services       Precautions / Restrictions Precautions Precautions: Fall Precaution Comments: 3 falls in last 6 months Restrictions Weight Bearing Restrictions: No      Mobility  Bed Mobility               General bed mobility comments: Pt up with nursing upon arrival  Transfers Overall transfer level: Needs assistance   Transfers: Sit to/from Stand Sit to Stand: Min guard         General transfer comment: Pt up with nursing, but nurse reported pt powered up without A and needed min/guard to initial unsteadiness.   Ambulation/Gait Ambulation/Gait assistance: Min guard Ambulation Distance (Feet): 12 Feet Assistive device: Rolling walker (2 wheeled) Gait Pattern/deviations: Trunk flexed;Decreased step length - right;Decreased step length - left Gait velocity: decreased   General Gait  Details: Pt just had gotten off BSC with nurse upon PT arrival and PT began ambulation with pt.  Pt with flexed posture with shortened step length.  Pt fatigued quickly.  Stairs            Wheelchair Mobility    Modified Rankin (Stroke Patients Only)       Balance Overall balance assessment: History of Falls;Needs assistance           Standing balance-Leahy Scale: Poor Standing balance comment: requires UE support                             Pertinent Vitals/Pain Pain Assessment: No/denies pain    Home Living Family/patient expects to be discharged to:: Private residence ("retirement home" Has 3 meals)   Available Help at Discharge: Personal care attendant Type of Home: Independent living facility       Home Layout: One level Home Equipment: Environmental consultant - 4 wheels;Wheelchair - manual;Shower seat;Grab bars - toilet;Grab bars - tub/shower (bed rail on standard bed) Additional Comments: o2 prn at home as well    Prior Function Level of Independence: Needs assistance   Gait / Transfers Assistance Needed: Normally ambulates around apartment and to meals with rollator, but in last month due to swelling her aide pushes her in her w/c to meals.     Comments: She hires an aide that helps her donn compression stockings and wheels her to meals.  She does not work for retirement home, but is available to A per pt.     Hand Dominance  Extremity/Trunk Assessment   Upper Extremity Assessment: Defer to OT evaluation           Lower Extremity Assessment: Generalized weakness      Cervical / Trunk Assessment: Kyphotic  Communication   Communication: HOH  Cognition Arousal/Alertness: Awake/alert Behavior During Therapy: WFL for tasks assessed/performed Overall Cognitive Status: Within Functional Limits for tasks assessed       Memory: Decreased short-term memory              General Comments General comments (skin integrity, edema, etc.):  redness and swelling noted in B LE    Exercises        Assessment/Plan    PT Assessment Patient needs continued PT services  PT Diagnosis Difficulty walking;Generalized weakness   PT Problem List Decreased strength;Decreased activity tolerance;Decreased balance;Decreased mobility  PT Treatment Interventions DME instruction;Gait training;Functional mobility training;Therapeutic activities;Therapeutic exercise;Balance training;Patient/family education   PT Goals (Current goals can be found in the Care Plan section) Acute Rehab PT Goals Patient Stated Goal: To go to the beach Sept 9 with daughter PT Goal Formulation: With patient/family Time For Goal Achievement: 07/17/16 Potential to Achieve Goals: Good    Frequency Min 3X/week   Barriers to discharge        Co-evaluation               End of Session Equipment Utilized During Treatment: Gait belt;Oxygen (re-applied (had been off upon entry)) Activity Tolerance: Patient limited by fatigue Patient left: in chair;with call bell/phone within reach;with chair alarm set;Other (comment) (with lunch) Nurse Communication: Mobility status         Time: 1202-1228 PT Time Calculation (min) (ACUTE ONLY): 26 min   Charges:   PT Evaluation $PT Eval Moderate Complexity: 1 Procedure PT Treatments $Gait Training: 8-22 mins   PT G Codes:        Kathy Hoover 07/10/2016, 1:19 PM

## 2016-07-10 NOTE — Progress Notes (Signed)
CRITICAL VALUE ALERT  Critical value received:  Troponin 0.02  Date of notification:  07/10/16  Time of notification:  [id number]  Critical value read back:Yes.    Nurse who received alert:  Janeann Merl  MD notified (1st page):    Time of first page:    MD notified (2nd page):  Time of second page:  Responding MD:   Time MD responded:   Pt's troponin level has been 0.03 with each blood draw. MD was not notified d/t no changes in lab results.

## 2016-07-11 ENCOUNTER — Inpatient Hospital Stay (HOSPITAL_COMMUNITY): Payer: Medicare Other

## 2016-07-11 DIAGNOSIS — I509 Heart failure, unspecified: Secondary | ICD-10-CM

## 2016-07-11 LAB — ECHOCARDIOGRAM COMPLETE
Height: 59 in
Weight: 2624 oz

## 2016-07-11 MED ORDER — POTASSIUM CHLORIDE ER 10 MEQ PO TBCR
20.0000 meq | EXTENDED_RELEASE_TABLET | Freq: Every day | ORAL | Status: DC
  Administered 2016-07-11 – 2016-07-12 (×2): 20 meq via ORAL
  Filled 2016-07-11 (×4): qty 2

## 2016-07-11 MED ORDER — FUROSEMIDE 10 MG/ML IJ SOLN
60.0000 mg | Freq: Two times a day (BID) | INTRAMUSCULAR | Status: DC
  Administered 2016-07-11 (×2): 60 mg via INTRAVENOUS
  Filled 2016-07-11 (×3): qty 6

## 2016-07-11 NOTE — Progress Notes (Signed)
Physical Therapy Treatment Patient Details Name: Kathy Hoover MRN: [id number] DOB: Aug 13, 1916 Today's Date: 07/11/2016    History of Present Illness Kathy Hoover is a 82 y.o. female with medical history significant of hypothyroidism, hypertension, moderate AS and chronic diastolic heart failure. Chronic leg edema, macular degeneration.  Presented with progressive bilateral leg swelling. Legs has been swelling for at least a month.     PT Comments    Feeling better today and able to walk further with RW; Needed supplemental O2 to keep O2 sats above 89%; walked with 2 liters O2 and sats incr to 94%; Hopes to dc home tomorrow, and that is not unreasonable from a PT standpoint  Follow Up Recommendations  Home health PT;Supervision for mobility/OOB     Equipment Recommendations  None recommended by PT    Recommendations for Other Services       Precautions / Restrictions Precautions Precautions: Fall Precaution Comments: 3 falls in last 6 months Restrictions Weight Bearing Restrictions: No    Mobility  Bed Mobility                  Transfers Overall transfer level: Needs assistance Equipment used: Rolling walker (2 wheeled) Transfers: Sit to/from Stand Sit to Stand: Min guard         General transfer comment: Minguard fo rsafety  Ambulation/Gait Ambulation/Gait assistance: Min guard Ambulation Distance (Feet): 100 Feet Assistive device: Rolling walker (2 wheeled) Gait Pattern/deviations: Decreased step length - right;Decreased step length - left;Trunk flexed;Shuffle Gait velocity: decreased Gait velocity interpretation: Below normal speed for age/gender General Gait Details: Cues to self-monitor for activity tolerance; reprted R hip pain during amb which she attributes to bursitis; bil shoulder fatigue as well   Stairs            Wheelchair Mobility    Modified Rankin (Stroke Patients Only)       Balance             Standing balance-Leahy  Scale: Poor                      Cognition Arousal/Alertness: Awake/alert Behavior During Therapy: WFL for tasks assessed/performed Overall Cognitive Status: Within Functional Limits for tasks assessed       Memory: Decreased short-term memory              Exercises      General Comments General comments (skin integrity, edema, etc.): Pt fatigues rapidly.  Discussed possiblity of hiring aid to provide more assist at discharge       Pertinent Vitals/Pain Pain Assessment: 0-10 Pain Score: 9  Pain Location: R hip bursitis pain with amb Pain Descriptors / Indicators: Aching Pain Intervention(s): Limited activity within patient's tolerance;Monitored during session    Home Living                      Prior Function            PT Goals (current goals can now be found in the care plan section) Acute Rehab PT Goals Patient Stated Goal: To go to the beach Sept 9 with daughter PT Goal Formulation: With patient/family Time For Goal Achievement: 07/17/16 Potential to Achieve Goals: Good Progress towards PT goals: Progressing toward goals    Frequency  Min 3X/week    PT Plan Current plan remains appropriate    Co-evaluation             End of Session Equipment Utilized During Treatment: Gait  belt;Oxygen Activity Tolerance: Patient limited by fatigue (but able to walk further today) Patient left: in chair;with call bell/phone within reach;with chair alarm set;Other (comment)     Time: [id number] PT Time Calculation (min) (ACUTE ONLY): 31 min  Charges:  $Gait Training: 23-37 mins                    G Codes:      Roney Marion Hamff 07/11/2016, 4:24 PM  Roney Marion, [place] Pager [phone] Office [phone]

## 2016-07-11 NOTE — Progress Notes (Signed)
  Echocardiogram 2D Echocardiogram has been performed.  Jennette Dubin 07/11/2016, 9:44 AM

## 2016-07-11 NOTE — Progress Notes (Signed)
PROGRESS NOTE        PATIENT DETAILS Name: Kathy Hoover Age: 82 y.o. Sex: female Date of Birth: 10/10/1916 Admit Date: 07/09/2016 Admitting Physician Toy Baker, MD [id number] PATRICK, MD  Brief Narrative: Patient is a 82 y.o. female with history of chronic diastolic heart failure, chronic hypoxic respiratory failure on home O2, admitted with worsening bilateral lower extremity edema likely secondary to acute on chronic diastolic heart failure. Improving with intravenous diuretics.  Subjective: Leg swelling continues to improve, very minimal erythema.  Assessment/Plan: Active Problems: Acute on chronic diastolic heart failure (EF 65-70% on echo on 07/11/16): Mostly presenting with lower extremity edema, edema is improving after initiation of intravenous Lasix. Weight decreased to 164 pounds (166 pounds on admission),-2.1 L so far. Plan is to continue intravenous Lasix for 1 additional day. Do not think she has cellulitis (no fever or leukocytosis and erythema is bilateral)-hence antibiotics was discontinued on 8/29.  Hypothyroidism: Continue Synthroid  History of hypertension: Hold amlodipine-continue intravenous diuretics. Blood pressure is currently well-controlled. May need to consider switching to an alternate agent-as calcium channel blockers can cause a worsening edema.  Moderate aortic stenosis: Outpatient follow-up with PCP-but really not a candidate for any further intervention-only supportive care and follow-up.  DVT Prophylaxis: Prophylactic Lovenox  Code Status:  DNR  Family Communication: None at bedside-had spoken with patient's 2 sons at bedside on 8/29  Disposition Plan: Remain discharge with home health services on 8/31   Antimicrobial agents: Doxycycline 8/28>>8/29  Procedures: none  CONSULTS:  None  Time spent: 25  minutes-Greater than 50% of this time was spent in counseling, explanation of diagnosis, planning  of further management, and coordination of care.  MEDICATIONS: Anti-infectives    Start     Dose/Rate Route Frequency Ordered Stop   07/09/16 2315  doxycycline (VIBRA-TABS) tablet 100 mg  Status:  Discontinued     100 mg Oral Every 12 hours 07/09/16 2308 07/10/16 0935      Scheduled Meds: . aspirin EC  81 mg Oral QODAY  . enoxaparin (LOVENOX) injection  30 mg Subcutaneous QHS  . furosemide  60 mg Intravenous BID  . levothyroxine  75 mcg Oral QAC breakfast  . potassium chloride  20 mEq Oral Daily  . sodium chloride flush  3 mL Intravenous Q12H   Continuous Infusions:  PRN Meds:.sodium chloride, acetaminophen, naphazoline-glycerin, ondansetron (ZOFRAN) IV, sodium chloride flush   PHYSICAL EXAM: Vital signs: Vitals:   07/10/16 2002 07/10/16 2329 07/11/16 0620 07/11/16 1045  BP: (!) 116/42 (!) 141/45 (!) 135/53 (!) 118/57  Pulse: 80 (!) 59 (!) 59 (!) 57  Resp: 20 18 18    Temp: 98.9 F (37.2 C) 98.7 F (37.1 C) 98.5 F (36.9 C)   TempSrc: Oral Oral Oral   SpO2: 96% 97% 98% 95%  Weight:   74.4 kg (164 lb)   Height:       Filed Weights   07/09/16 2100 07/10/16 0455 07/11/16 0620  Weight: 75.6 kg (166 lb 11.2 oz) 74.3 kg (163 lb 14.4 oz) 74.4 kg (164 lb)   Body mass index is 33.12 kg/m.   Gen Exam: Awake and alert with clear speech. Not in any distress  Neck: Supple, No JVD.   Chest: B/L Clear.   CVS: S1 S2 Regular, +3/6 syst murmur Abdomen: soft, BS +, non tender, non distended.  Extremities: + edema, lower  extremities warm to touch-mild erythemaStill present in bilateral lower extremities Neurologic: Non Focal.  Skin: No Rash or lesions   Wounds: N/A.    I have personally reviewed following labs and imaging studies  LABORATORY DATA: CBC:  Recent Labs Lab 07/09/16 1631  WBC 5.5  NEUTROABS 3.0  HGB 11.4*  HCT 35.3*  MCV 95.4  PLT PLATELET COUNT CONFIRMED BY SMEAR    Basic Metabolic Panel:  Recent Labs Lab 07/09/16 1631 07/10/16 0516  NA 138 141  K  3.8 3.8  CL 97* 99*  CO2 29 34*  GLUCOSE 92 102*  BUN 26* 22*  CREATININE 1.17* 1.14*  CALCIUM 9.4 9.2    GFR: Estimated Creatinine Clearance: 24.2 mL/min (by C-G formula based on SCr of 1.14 mg/dL).  Liver Function Tests:  Recent Labs Lab 07/10/16 0516  AST 22  ALT 15  ALKPHOS 85  BILITOT 0.8  PROT 6.4*  ALBUMIN 3.4*   No results for input(s): LIPASE, AMYLASE in the last 168 hours. No results for input(s): AMMONIA in the last 168 hours.  Coagulation Profile: No results for input(s): INR, PROTIME in the last 168 hours.  Cardiac Enzymes:  Recent Labs Lab 07/09/16 1855 07/09/16 2313 07/10/16 0516 07/10/16 1222  TROPONINI 0.03* <0.03 0.03* 0.03*    BNP (last 3 results) No results for input(s): PROBNP in the last 8760 hours.  HbA1C: No results for input(s): HGBA1C in the last 72 hours.  CBG: No results for input(s): GLUCAP in the last 168 hours.  Lipid Profile: No results for input(s): CHOL, HDL, LDLCALC, TRIG, CHOLHDL, LDLDIRECT in the last 72 hours.  Thyroid Function Tests: No results for input(s): TSH, T4TOTAL, FREET4, T3FREE, THYROIDAB in the last 72 hours.  Anemia Panel: No results for input(s): VITAMINB12, FOLATE, FERRITIN, TIBC, IRON, RETICCTPCT in the last 72 hours.  Urine analysis: No results found for: COLORURINE, APPEARANCEUR, LABSPEC, PHURINE, GLUCOSEU, HGBUR, BILIRUBINUR, KETONESUR, PROTEINUR, UROBILINOGEN, NITRITE, LEUKOCYTESUR  Sepsis Labs: Lactic Acid, Venous    Component Value Date/Time   LATICACIDVEN 1.94 12/03/2015 1755    MICROBIOLOGY: No results found for this or any previous visit (from the past 240 hour(s)).  RADIOLOGY STUDIES/RESULTS: Dg Chest 2 View  Result Date: 07/09/2016 CLINICAL DATA:  Bilateral lower extremity swelling. EXAM: CHEST  2 VIEW COMPARISON:  Radiographs of December 03, 2015. FINDINGS: Stable cardiomegaly. Right axillary surgical clips are noted. No pneumothorax or pleural effusion is noted. No acute  pulmonary disease is noted. Bony thorax is unremarkable. IMPRESSION: No active cardiopulmonary disease. Electronically Signed   By: Marijo Conception, M.D.   On: 07/09/2016 16:10     LOS: 2 days   Oren Binet, MD  [facility] Pager:[phone]  If 7PM-7AM, please contact night-coverage [email or web address] Password [email or web address] 07/11/2016, 1:01 PM

## 2016-07-12 DIAGNOSIS — L03119 Cellulitis of unspecified part of limb: Secondary | ICD-10-CM

## 2016-07-12 LAB — BASIC METABOLIC PANEL
ANION GAP: 9 (ref 5–15)
BUN: 26 mg/dL — ABNORMAL HIGH (ref 6–20)
CALCIUM: 8.9 mg/dL (ref 8.9–10.3)
CO2: 31 mmol/L (ref 22–32)
Chloride: 99 mmol/L — ABNORMAL LOW (ref 101–111)
Creatinine, Ser: 1.19 mg/dL — ABNORMAL HIGH (ref 0.44–1.00)
GFR calc Af Amer: 43 mL/min — ABNORMAL LOW (ref >60)
GFR, EST NON AFRICAN AMERICAN: 37 mL/min — AB (ref >60)
Glucose, Bld: 104 mg/dL — ABNORMAL HIGH (ref 65–99)
POTASSIUM: 3.6 mmol/L (ref 3.5–5.1)
SODIUM: 139 mmol/L (ref 135–145)

## 2016-07-12 MED ORDER — TORSEMIDE 20 MG PO TABS: 40.0000 mg | ORAL_TABLET | Freq: Every day | ORAL | 0 refills | Status: AC

## 2016-07-12 MED ORDER — FUROSEMIDE 10 MG/ML IJ SOLN
80.0000 mg | Freq: Once | INTRAMUSCULAR | Status: AC
  Administered 2016-07-12: 80 mg via INTRAVENOUS

## 2016-07-12 MED ORDER — POTASSIUM CHLORIDE ER 20 MEQ PO TBCR: 20.0000 meq | EXTENDED_RELEASE_TABLET | Freq: Every day | ORAL | 0 refills | Status: AC

## 2016-07-12 NOTE — Care Management Note (Signed)
Case Management Note  Patient Details  Name: Kathy Hoover MRN: [id number] Date of Birth: 08/21/1916  Subjective/Objective:     Admitted with CHF               Action/Plan: Patient resides in an [facility] per Leodis Sias; she has home oxygen at her home, rolater, walker, transport chair; she also have a caregiver that assist her with her ADL's. Patient could benefit from a Disease Management Program for CHF; [place] choice offered to patient with son Tim at the bedside. Pt chose [facility] Abby with [facility] made aware of referral.  Expected Discharge Date:   07/12/2016               Expected Discharge Plan:  [place]  In-House Referral:  Clinical Social Work  Discharge planning Services  CM Consult    HH Arranged:  RN, Disease Management [place] Agency:  [facility]  Status of Service:  In process, will continue to follow  Sherrilyn Rist [phone] 07/12/2016, 10:43 AM

## 2016-07-12 NOTE — Progress Notes (Signed)
Patient and son given discharge instructions and all questions answered.  Patient and son refused to wait for an oxygen tank for the ride back to facility and said that she has a tank at home.  Patient discharged via wheelchair with all belongings.

## 2016-07-12 NOTE — Progress Notes (Signed)
SATURATION QUALIFICATIONS: (This note is used to comply with regulatory documentation for home oxygen)  Patient Saturations on Room Air at Rest = 97%  Patient Saturations on Room Air while Ambulating = 87%  Patient Saturations on 2 Liters of oxygen while Ambulating = 93%  Please briefly explain why patient needs home oxygen:

## 2016-07-12 NOTE — Care Management Important Message (Signed)
Important Message  Patient Details  Name: Kathy Hoover MRN: [id number] Date of Birth: 02-05-1916   Medicare Important Message Given:  Yes    [name] 07/12/2016, 10:30 AM

## 2016-07-12 NOTE — Progress Notes (Signed)
PT Cancellation Note  Patient Details Name: Kathy Hoover MRN: [id number] DOB: 10/13/1916   Cancelled Treatment:    Reason Eval/Treat Not Completed: Patient declined, no reason specified.  Pt scheduled to d/c and in the bed with family present. Pt states she walked earlier and would prefer not to walk again.  Pt asking about HHPT and stated that CM had set up for her.  All pt and family questions answered. No charge.   Chey Rachels LUBECK 07/12/2016, 1:38 PM

## 2016-07-12 NOTE — Discharge Summary (Signed)
PATIENT DETAILS Name: Kathy Hoover Age: 82 y.o. Sex: female Date of Birth: 02-26-1916 MRN: [id number]. Admitting Physician: Toy Baker, MD [id number] Saralyn Pilar, MD  Admit Date: 07/09/2016 Discharge date: 07/12/2016  Recommendations for Outpatient Follow-up:  1. Follow up with PCP in 1-2 weeks 2. Please obtain BMP/CBC in one week  Admitted From:  ILF  Disposition: ILF with home health PT and RN   Home Health:  Yes  Equipment/Devices:  oxygen 2L prn (on O2 prior to admission)  Discharge Condition: Stable  CODE STATUS: DNR  Diet recommendation:  Heart Healthy  Brief Summary: See H&P, Labs, Consult and Test reports for all details in brief, Patient is a 82 y.o. female with history of chronic diastolic heart failure, chronic hypoxic respiratory failure on home O2, admitted with worsening bilateral lower extremity edema likely secondary to acute on chronic diastolic heart failure. Improved with intravenous Lasix, transitioned to [facility] on discharge.  Brief Hospital Course: Acute on chronic diastolic heart failure (EF 65-70% on echo on 07/11/16): Mostly presenting with lower extremity edema, edema is improving after initiation of intravenous Lasix. Weight decreased to 163 pounds (166 pounds on admission),-2.4 L so far. Given advanced age and frailty, do not think we can push more further diuresis, edema has markedly decreased-but continues to have some edema-which I suspect is her baseline. We will transition her to [facility] on discharge. She has some erythema in her bilateral lower extremities, given lack of fever or leukocytosis and erythema being bilateral-do not think she has cellulitis.  Hypothyroidism: Continue Synthroid  History of hypertension:  blood pressure currently controlled without any antihypertensives-she was on amlodipine prior to admission-this has been discontinued as this could contribute to some of her lower extremity edema. Follow-up with  cardiology or PCP in next week, if needed another/alternative agent could be added.  Moderate aortic stenosis: Outpatient follow-up with PCP-but really not a candidate for any further intervention-only supportive care and follow-up.  History of chronic hypoxic respiratory failure: On home O2 as needed.  Procedures/Studies: Echo 8/30>> EF [id number], grade 3 diastolic dysfunction. Moderate aortic stenosis.  Discharge Diagnoses:  Active Problems:   Essential hypertension, benign   Acquired stenosis of aortic valve   CHF (congestive heart failure) (HCC)   Cellulitis   Acute on chronic diastolic CHF (congestive heart failure) [facility])   Discharge Instructions:  Activity:  As tolerated with Full fall precautions use walker/cane & assistance as needed  Discharge Instructions    (HEART FAILURE PATIENTS) Call MD:  Anytime you have any of the following symptoms: 1) 3 pound weight gain in 24 hours or 5 pounds in 1 week 2) shortness of breath, with or without a dry hacking cough 3) swelling in the hands, feet or stomach 4) if you have to sleep on extra pillows at night in order to breathe.    Complete by:  As directed   Avoid straining    Complete by:  As directed   Diet - low sodium heart healthy    Complete by:  As directed   Discharge instructions    Complete by:  As directed   Follow with Primary MD  Anthoney Harada, MD  and Isaac Laud Meg-PA-C with cardiology as instructed your Hospitalist MD  Please get a complete blood count and chemistry panel checked by your Primary MD at your next visit, and again as instructed by your Primary MD.  Get Medicines reviewed and adjusted: Please take all your medications with you for your next visit with your Primary MD  Laboratory/radiological data: Please request your Primary MD to go over all hospital tests and procedure/radiological results at the follow up, please ask your Primary MD to get all Hospital records sent to his/her office.  In some cases,  they will be blood work, cultures and biopsy results pending at the time of your discharge. Please request that your primary care M.D. follows up on these results.  Also Note the following: If you experience worsening of your admission symptoms, develop shortness of breath, life threatening emergency, suicidal or homicidal thoughts you must seek medical attention immediately by calling 911 or calling your MD immediately  if symptoms less severe.  You must read complete instructions/literature along with all the possible adverse reactions/side effects for all the Medicines you take and that have been prescribed to you. Take any new Medicines after you have completely understood and accpet all the possible adverse reactions/side effects.   Do not drive when taking Pain medications or sleeping medications (Benzodaizepines)  Do not take more than prescribed Pain, Sleep and Anxiety Medications. It is not advisable to combine anxiety,sleep and pain medications without talking with your primary care practitioner  Special Instructions: If you have smoked or chewed Tobacco  in the last 2 yrs please stop smoking, stop any regular Alcohol  and or any Recreational drug use.  Wear Seat belts while driving.  Please note: You were cared for by a hospitalist during your hospital stay. Once you are discharged, your primary care physician will handle any further medical issues. Please note that NO REFILLS for any discharge medications will be authorized once you are discharged, as it is imperative that you return to your primary care physician (or establish a relationship with a primary care physician if you do not have one) for your post hospital discharge needs so that they can reassess your need for medications and monitor your lab values.   Heart Failure patients record your daily weight using the same scale at the same time of day    Complete by:  As directed   Increase activity slowly    Complete by:  As  directed   STOP any activity that causes chest pain, shortness of breath, dizziness, sweating, or exessive weakness    Complete by:  As directed       Medication List    STOP taking these medications   amLODipine 2.5 MG tablet Commonly known as:  NORVASC   furosemide 40 MG tablet Commonly known as:  LASIX     TAKE these medications   acetaminophen 325 MG tablet Commonly known as:  TYLENOL Take 2 tablets (650 mg total) by mouth every 6 (six) hours as needed (pain or Fever >/= 101).   aspirin 81 MG EC tablet Take 1 tablet (81 mg total) by mouth every other day.   Cholecalciferol 2000 units Caps Take 2,000 Units by mouth every evening.   levothyroxine 75 MCG tablet Commonly known as:  SYNTHROID, LEVOTHROID Take 1 tablet (75 mcg total) by mouth daily before breakfast.   multivitamin with minerals Tabs tablet Take 1 tablet by mouth daily.   nitroGLYCERIN 0.4 MG SL tablet Commonly known as:  NITROSTAT Place 1 tablet (0.4 mg total) under the tongue every 5 (five) minutes as needed for chest pain.   Potassium Chloride ER 20 MEQ Tbcr Take 20 mEq by mouth daily. What changed:  medication strength  how much to take   PRESERVISION AREDS 2 PO Take 2 tablets by mouth daily.   SOLUBLE FIBER/PROBIOTICS  PO Take 1 tablet by mouth daily.   SYSTANE ULTRA 0.4-0.3 % Soln Generic drug:  Polyethyl Glycol-Propyl Glycol Apply 1 drop to eye 2 (two) times daily as needed.   torsemide 20 MG tablet Commonly known as:  DEMADEX Take 2 tablets (40 mg total) by mouth daily.   vitamin C 500 MG tablet Commonly known as:  ASCORBIC ACID Take 500 mg by mouth daily.      Follow-up Information    Anthoney Harada, MD Follow up on 07/20/2016.   Specialty:  Family Medicine Why:  At 3:45pm for hospital follow up appt.  Contact information: [place] [address] [place], [place] Follow up on 07/20/2016.   Specialties:  Cardiology, Radiology Why:   appointment at 3 pm Contact information: [address] [place] [address] [phone]          Allergies  Allergen Reactions  . Penicillins Swelling    Lip swelling Lip swelling  . Ciprofloxacin Nausea And Vomiting  . Codeine Swelling    Lip Swelling  . Metronidazole Itching    Consultations:   cardiology  Other Procedures/Studies: Dg Chest 2 View  Result Date: 07/09/2016 CLINICAL DATA:  Bilateral lower extremity swelling. EXAM: CHEST  2 VIEW COMPARISON:  Radiographs of December 03, 2015. FINDINGS: Stable cardiomegaly. Right axillary surgical clips are noted. No pneumothorax or pleural effusion is noted. No acute pulmonary disease is noted. Bony thorax is unremarkable. IMPRESSION: No active cardiopulmonary disease. Electronically Signed   By: Marijo Conception, M.D.   On: 07/09/2016 16:10      TODAY-DAY OF DISCHARGE:  Subjective:   Kathy Hoover today has no headache,no chest abdominal pain,no new weakness tingling or numbness, feels much better wants to go home today.   Objective:   Blood pressure (!) 111/47, pulse 64, temperature 98.6 F (37 C), temperature source Oral, resp. rate 20, height 4\' 11"  (1.499 m), weight 74 kg (163 lb 1.6 oz), SpO2 96 %.  Intake/Output Summary (Last 24 hours) at 07/12/16 1041 Last data filed at 07/12/16 0929  Gross per 24 hour  Intake              580 ml  Output             1300 ml  Net             -720 ml   Filed Weights   07/10/16 0455 07/11/16 0620 07/12/16 0553  Weight: 74.3 kg (163 lb 14.4 oz) 74.4 kg (164 lb) 74 kg (163 lb 1.6 oz)    Exam: Awake Alert, Oriented *3, No new F.N deficits, Normal affect .AT,PERRAL Supple Neck,No JVD, No cervical lymphadenopathy appriciated.  Symmetrical Chest wall movement, Good air movement bilaterally, CTAB RRR,No Gallops,Rubs or new Murmurs, No Parasternal Heave +ve B.Sounds, Abd Soft, Non tender, No organomegaly appriciated, No rebound -guarding or rigidity. No  Cyanosis, Clubbing or edema, No new Rash or bruise   PERTINENT RADIOLOGIC STUDIES: Dg Chest 2 View  Result Date: 07/09/2016 CLINICAL DATA:  Bilateral lower extremity swelling. EXAM: CHEST  2 VIEW COMPARISON:  Radiographs of December 03, 2015. FINDINGS: Stable cardiomegaly. Right axillary surgical clips are noted. No pneumothorax or pleural effusion is noted. No acute pulmonary disease is noted. Bony thorax is unremarkable. IMPRESSION: No active cardiopulmonary disease. Electronically Signed   By: Marijo Conception, M.D.   On: 07/09/2016 16:10    PERTINENT LAB RESULTS: CBC:  Recent Labs  07/09/16 1631  WBC 5.5  HGB 11.4*  HCT 35.3*  PLT PLATELET COUNT CONFIRMED BY SMEAR   CMET CMP     Component Value Date/Time   NA 139 07/12/2016 0242   K 3.6 07/12/2016 0242   CL 99 (L) 07/12/2016 0242   CO2 31 07/12/2016 0242   GLUCOSE 104 (H) 07/12/2016 0242   BUN 26 (H) 07/12/2016 0242   CREATININE 1.19 (H) 07/12/2016 0242   CREATININE 1.00 (H) 07/04/2016 1229   CALCIUM 8.9 07/12/2016 0242   PROT 6.4 (L) 07/10/2016 0516   ALBUMIN 3.4 (L) 07/10/2016 0516   AST 22 07/10/2016 0516   ALT 15 07/10/2016 0516   ALKPHOS 85 07/10/2016 0516   BILITOT 0.8 07/10/2016 0516   GFRNONAA 37 (L) 07/12/2016 0242   GFRAA 43 (L) 07/12/2016 0242    GFR Estimated Creatinine Clearance: 23.1 mL/min (by C-G formula based on SCr of 1.19 mg/dL). No results for input(s): LIPASE, AMYLASE in the last 72 hours.  Recent Labs  07/09/16 2313 07/10/16 0516 07/10/16 1222  TROPONINI <0.03 0.03* 0.03*   Invalid input(s): POCBNP  Recent Labs  07/09/16 1631  DDIMER 1.11*   No results for input(s): HGBA1C in the last 72 hours. No results for input(s): CHOL, HDL, LDLCALC, TRIG, CHOLHDL, LDLDIRECT in the last 72 hours. No results for input(s): TSH, T4TOTAL, T3FREE, THYROIDAB in the last 72 hours.  Invalid input(s): FREET3 No results for input(s): VITAMINB12, FOLATE, FERRITIN, TIBC, IRON, RETICCTPCT in the last  72 hours. Coags: No results for input(s): INR in the last 72 hours.  Invalid input(s): PT Microbiology: No results found for this or any previous visit (from the past 240 hour(s)).  FURTHER DISCHARGE INSTRUCTIONS:  Get Medicines reviewed and adjusted: Please take all your medications with you for your next visit with your Primary MD  Laboratory/radiological data: Please request your Primary MD to go over all hospital tests and procedure/radiological results at the follow up, please ask your Primary MD to get all Hospital records sent to his/her office.  In some cases, they will be blood work, cultures and biopsy results pending at the time of your discharge. Please request that your primary care M.D. goes through all the records of your hospital data and follows up on these results.  Also Note the following: If you experience worsening of your admission symptoms, develop shortness of breath, life threatening emergency, suicidal or homicidal thoughts you must seek medical attention immediately by calling 911 or calling your MD immediately  if symptoms less severe.  You must read complete instructions/literature along with all the possible adverse reactions/side effects for all the Medicines you take and that have been prescribed to you. Take any new Medicines after you have completely understood and accpet all the possible adverse reactions/side effects.   Do not drive when taking Pain medications or sleeping medications (Benzodaizepines)  Do not take more than prescribed Pain, Sleep and Anxiety Medications. It is not advisable to combine anxiety,sleep and pain medications without talking with your primary care practitioner  Special Instructions: If you have smoked or chewed Tobacco  in the last 2 yrs please stop smoking, stop any regular Alcohol  and or any Recreational drug use.  Wear Seat belts while driving.  Please note: You were cared for by a hospitalist during your hospital  stay. Once you are discharged, your primary care physician will handle any further medical issues. Please note that NO REFILLS for any discharge medications will be authorized once you are discharged,  as it is imperative that you return to your primary care physician (or establish a relationship with a primary care physician if you do not have one) for your post hospital discharge needs so that they can reassess your need for medications and monitor your lab values.  Total Time spent coordinating discharge including counseling, education and face to face time equals  45 minutes.  SignedOren Binet 07/12/2016 10:41 AM

## 2016-07-13 DIAGNOSIS — R079 Chest pain, unspecified: Secondary | ICD-10-CM

## 2016-07-13 HISTORY — DX: Chest pain, unspecified: R07.9

## 2016-07-17 DIAGNOSIS — M6281 Muscle weakness (generalized): Secondary | ICD-10-CM | POA: Diagnosis not present

## 2016-07-17 DIAGNOSIS — Z9181 History of falling: Secondary | ICD-10-CM | POA: Diagnosis not present

## 2016-07-17 DIAGNOSIS — I872 Venous insufficiency (chronic) (peripheral): Secondary | ICD-10-CM | POA: Diagnosis not present

## 2016-07-17 DIAGNOSIS — M545 Low back pain: Secondary | ICD-10-CM | POA: Diagnosis not present

## 2016-07-17 DIAGNOSIS — M25551 Pain in right hip: Secondary | ICD-10-CM | POA: Diagnosis not present

## 2016-07-17 DIAGNOSIS — M25512 Pain in left shoulder: Secondary | ICD-10-CM | POA: Diagnosis not present

## 2016-07-17 DIAGNOSIS — L570 Actinic keratosis: Secondary | ICD-10-CM | POA: Diagnosis not present

## 2016-07-17 DIAGNOSIS — X32XXXD Exposure to sunlight, subsequent encounter: Secondary | ICD-10-CM | POA: Diagnosis not present

## 2016-07-18 DIAGNOSIS — H353211 Exudative age-related macular degeneration, right eye, with active choroidal neovascularization: Secondary | ICD-10-CM | POA: Diagnosis not present

## 2016-07-18 DIAGNOSIS — H43813 Vitreous degeneration, bilateral: Secondary | ICD-10-CM | POA: Diagnosis not present

## 2016-07-18 DIAGNOSIS — H353222 Exudative age-related macular degeneration, left eye, with inactive choroidal neovascularization: Secondary | ICD-10-CM | POA: Diagnosis not present

## 2016-07-19 DIAGNOSIS — M25551 Pain in right hip: Secondary | ICD-10-CM | POA: Diagnosis not present

## 2016-07-19 DIAGNOSIS — M6281 Muscle weakness (generalized): Secondary | ICD-10-CM | POA: Diagnosis not present

## 2016-07-19 DIAGNOSIS — Z9181 History of falling: Secondary | ICD-10-CM | POA: Diagnosis not present

## 2016-07-19 DIAGNOSIS — M25512 Pain in left shoulder: Secondary | ICD-10-CM | POA: Diagnosis not present

## 2016-07-19 DIAGNOSIS — M545 Low back pain: Secondary | ICD-10-CM | POA: Diagnosis not present

## 2016-07-20 ENCOUNTER — Ambulatory Visit: Payer: Medicare Other | Admitting: Physician Assistant

## 2016-07-20 DIAGNOSIS — M1711 Unilateral primary osteoarthritis, right knee: Secondary | ICD-10-CM | POA: Diagnosis not present

## 2016-07-20 DIAGNOSIS — I350 Nonrheumatic aortic (valve) stenosis: Secondary | ICD-10-CM | POA: Diagnosis not present

## 2016-07-20 DIAGNOSIS — M6281 Muscle weakness (generalized): Secondary | ICD-10-CM | POA: Diagnosis not present

## 2016-07-20 DIAGNOSIS — Z9181 History of falling: Secondary | ICD-10-CM | POA: Diagnosis not present

## 2016-07-20 DIAGNOSIS — L97911 Non-pressure chronic ulcer of unspecified part of right lower leg limited to breakdown of skin: Secondary | ICD-10-CM | POA: Diagnosis not present

## 2016-07-20 DIAGNOSIS — M545 Low back pain: Secondary | ICD-10-CM | POA: Diagnosis not present

## 2016-07-20 DIAGNOSIS — M25512 Pain in left shoulder: Secondary | ICD-10-CM | POA: Diagnosis not present

## 2016-07-20 DIAGNOSIS — I10 Essential (primary) hypertension: Secondary | ICD-10-CM | POA: Diagnosis not present

## 2016-07-20 DIAGNOSIS — E039 Hypothyroidism, unspecified: Secondary | ICD-10-CM | POA: Diagnosis not present

## 2016-07-20 DIAGNOSIS — E559 Vitamin D deficiency, unspecified: Secondary | ICD-10-CM | POA: Diagnosis not present

## 2016-07-20 DIAGNOSIS — H3561 Retinal hemorrhage, right eye: Secondary | ICD-10-CM | POA: Diagnosis not present

## 2016-07-20 DIAGNOSIS — M25551 Pain in right hip: Secondary | ICD-10-CM | POA: Diagnosis not present

## 2016-07-20 DIAGNOSIS — I5030 Unspecified diastolic (congestive) heart failure: Secondary | ICD-10-CM | POA: Diagnosis not present

## 2016-07-23 DIAGNOSIS — M25512 Pain in left shoulder: Secondary | ICD-10-CM | POA: Diagnosis not present

## 2016-07-23 DIAGNOSIS — M25551 Pain in right hip: Secondary | ICD-10-CM | POA: Diagnosis not present

## 2016-07-23 DIAGNOSIS — M6281 Muscle weakness (generalized): Secondary | ICD-10-CM | POA: Diagnosis not present

## 2016-07-23 DIAGNOSIS — M545 Low back pain: Secondary | ICD-10-CM | POA: Diagnosis not present

## 2016-07-23 DIAGNOSIS — Z9181 History of falling: Secondary | ICD-10-CM | POA: Diagnosis not present

## 2016-07-24 DIAGNOSIS — M25512 Pain in left shoulder: Secondary | ICD-10-CM | POA: Diagnosis not present

## 2016-07-24 DIAGNOSIS — Z9181 History of falling: Secondary | ICD-10-CM | POA: Diagnosis not present

## 2016-07-24 DIAGNOSIS — M25551 Pain in right hip: Secondary | ICD-10-CM | POA: Diagnosis not present

## 2016-07-24 DIAGNOSIS — M545 Low back pain: Secondary | ICD-10-CM | POA: Diagnosis not present

## 2016-07-24 DIAGNOSIS — M6281 Muscle weakness (generalized): Secondary | ICD-10-CM | POA: Diagnosis not present

## 2016-07-25 ENCOUNTER — Ambulatory Visit: Payer: Medicare Other | Admitting: Cardiology

## 2016-07-25 DIAGNOSIS — S81819A Laceration without foreign body, unspecified lower leg, initial encounter: Secondary | ICD-10-CM | POA: Diagnosis not present

## 2016-07-25 DIAGNOSIS — L97912 Non-pressure chronic ulcer of unspecified part of right lower leg with fat layer exposed: Secondary | ICD-10-CM | POA: Diagnosis not present

## 2016-07-26 DIAGNOSIS — Z9181 History of falling: Secondary | ICD-10-CM | POA: Diagnosis not present

## 2016-07-26 DIAGNOSIS — R531 Weakness: Secondary | ICD-10-CM | POA: Diagnosis not present

## 2016-07-26 DIAGNOSIS — M25551 Pain in right hip: Secondary | ICD-10-CM | POA: Diagnosis not present

## 2016-07-26 DIAGNOSIS — M545 Low back pain: Secondary | ICD-10-CM | POA: Diagnosis not present

## 2016-07-26 DIAGNOSIS — M25512 Pain in left shoulder: Secondary | ICD-10-CM | POA: Diagnosis not present

## 2016-07-26 DIAGNOSIS — M6281 Muscle weakness (generalized): Secondary | ICD-10-CM | POA: Diagnosis not present

## 2016-07-30 ENCOUNTER — Ambulatory Visit: Payer: Medicare Other | Admitting: Physician Assistant

## 2016-07-30 DIAGNOSIS — M25551 Pain in right hip: Secondary | ICD-10-CM | POA: Diagnosis not present

## 2016-07-30 DIAGNOSIS — M6281 Muscle weakness (generalized): Secondary | ICD-10-CM | POA: Diagnosis not present

## 2016-07-30 DIAGNOSIS — M25512 Pain in left shoulder: Secondary | ICD-10-CM | POA: Diagnosis not present

## 2016-07-30 DIAGNOSIS — Z9181 History of falling: Secondary | ICD-10-CM | POA: Diagnosis not present

## 2016-07-30 DIAGNOSIS — M545 Low back pain: Secondary | ICD-10-CM | POA: Diagnosis not present

## 2016-07-31 ENCOUNTER — Observation Stay (HOSPITAL_COMMUNITY)
Admission: EM | Admit: 2016-07-31 | Discharge: 2016-08-02 | Disposition: A | Discharge status: Home / Self Care | Payer: Medicare Other | Attending: Internal Medicine | Admitting: Internal Medicine

## 2016-07-31 ENCOUNTER — Encounter (HOSPITAL_COMMUNITY): Payer: Self-pay | Admitting: Emergency Medicine

## 2016-07-31 ENCOUNTER — Emergency Department (HOSPITAL_COMMUNITY): Payer: Medicare Other

## 2016-07-31 DIAGNOSIS — E039 Hypothyroidism, unspecified: Secondary | ICD-10-CM | POA: Diagnosis not present

## 2016-07-31 DIAGNOSIS — M6281 Muscle weakness (generalized): Secondary | ICD-10-CM | POA: Diagnosis not present

## 2016-07-31 DIAGNOSIS — E875 Hyperkalemia: Secondary | ICD-10-CM | POA: Insufficient documentation

## 2016-07-31 DIAGNOSIS — R627 Adult failure to thrive: Secondary | ICD-10-CM

## 2016-07-31 DIAGNOSIS — I5030 Unspecified diastolic (congestive) heart failure: Secondary | ICD-10-CM | POA: Diagnosis not present

## 2016-07-31 DIAGNOSIS — L97911 Non-pressure chronic ulcer of unspecified part of right lower leg limited to breakdown of skin: Secondary | ICD-10-CM | POA: Diagnosis not present

## 2016-07-31 DIAGNOSIS — Z66 Do not resuscitate: Secondary | ICD-10-CM | POA: Diagnosis not present

## 2016-07-31 DIAGNOSIS — R079 Chest pain, unspecified: Secondary | ICD-10-CM | POA: Diagnosis not present

## 2016-07-31 DIAGNOSIS — I10 Essential (primary) hypertension: Secondary | ICD-10-CM | POA: Diagnosis not present

## 2016-07-31 DIAGNOSIS — I5032 Chronic diastolic (congestive) heart failure: Secondary | ICD-10-CM | POA: Diagnosis not present

## 2016-07-31 DIAGNOSIS — Z7982 Long term (current) use of aspirin: Secondary | ICD-10-CM | POA: Diagnosis not present

## 2016-07-31 DIAGNOSIS — M1711 Unilateral primary osteoarthritis, right knee: Secondary | ICD-10-CM | POA: Diagnosis not present

## 2016-07-31 DIAGNOSIS — Z853 Personal history of malignant neoplasm of breast: Secondary | ICD-10-CM | POA: Diagnosis not present

## 2016-07-31 DIAGNOSIS — M25559 Pain in unspecified hip: Secondary | ICD-10-CM

## 2016-07-31 DIAGNOSIS — M25551 Pain in right hip: Secondary | ICD-10-CM | POA: Diagnosis not present

## 2016-07-31 DIAGNOSIS — M545 Low back pain: Secondary | ICD-10-CM | POA: Diagnosis not present

## 2016-07-31 DIAGNOSIS — K5900 Constipation, unspecified: Secondary | ICD-10-CM | POA: Diagnosis not present

## 2016-07-31 DIAGNOSIS — I110 Hypertensive heart disease with heart failure: Secondary | ICD-10-CM | POA: Insufficient documentation

## 2016-07-31 DIAGNOSIS — Z9181 History of falling: Secondary | ICD-10-CM | POA: Diagnosis not present

## 2016-07-31 DIAGNOSIS — L97912 Non-pressure chronic ulcer of unspecified part of right lower leg with fat layer exposed: Secondary | ICD-10-CM | POA: Diagnosis not present

## 2016-07-31 DIAGNOSIS — I350 Nonrheumatic aortic (valve) stenosis: Secondary | ICD-10-CM | POA: Diagnosis not present

## 2016-07-31 DIAGNOSIS — Z880 Allergy status to penicillin: Secondary | ICD-10-CM | POA: Insufficient documentation

## 2016-07-31 DIAGNOSIS — E559 Vitamin D deficiency, unspecified: Secondary | ICD-10-CM | POA: Diagnosis not present

## 2016-07-31 DIAGNOSIS — M25512 Pain in left shoulder: Secondary | ICD-10-CM | POA: Diagnosis not present

## 2016-07-31 HISTORY — DX: Non-ST elevation (NSTEMI) myocardial infarction: I21.4

## 2016-07-31 HISTORY — DX: Gastro-esophageal reflux disease without esophagitis: K21.9

## 2016-07-31 HISTORY — DX: Chest pain, unspecified: R07.9

## 2016-07-31 LAB — CBC WITH DIFFERENTIAL/PLATELET
BASOS PCT: 1 %
Basophils Absolute: 0 10*3/uL (ref 0.0–0.1)
EOS ABS: 0.1 10*3/uL (ref 0.0–0.7)
Eosinophils Relative: 1 %
HEMATOCRIT: 31 % — AB (ref 36.0–46.0)
Hemoglobin: 9.7 g/dL — ABNORMAL LOW (ref 12.0–15.0)
Lymphocytes Relative: 24 %
Lymphs Abs: 1.5 10*3/uL (ref 0.7–4.0)
MCH: 30.2 pg (ref 26.0–34.0)
MCHC: 31.3 g/dL (ref 30.0–36.0)
MCV: 96.6 fL (ref 78.0–100.0)
MONO ABS: 0.9 10*3/uL (ref 0.1–1.0)
MONOS PCT: 15 %
Neutro Abs: 3.7 10*3/uL (ref 1.7–7.7)
Neutrophils Relative %: 59 %
Platelets: 177 10*3/uL (ref 150–400)
RBC: 3.21 MIL/uL — ABNORMAL LOW (ref 3.87–5.11)
RDW: 15 % (ref 11.5–15.5)
WBC: 6.3 10*3/uL (ref 4.0–10.5)

## 2016-07-31 LAB — COMPREHENSIVE METABOLIC PANEL
ALBUMIN: 3.4 g/dL — AB (ref 3.5–5.0)
ALT: 15 U/L (ref 14–54)
ANION GAP: 8 (ref 5–15)
AST: 20 U/L (ref 15–41)
Alkaline Phosphatase: 82 U/L (ref 38–126)
BILIRUBIN TOTAL: 0.8 mg/dL (ref 0.3–1.2)
BUN: 20 mg/dL (ref 6–20)
CO2: 29 mmol/L (ref 22–32)
Calcium: 9.2 mg/dL (ref 8.9–10.3)
Chloride: 100 mmol/L — ABNORMAL LOW (ref 101–111)
Creatinine, Ser: 1.3 mg/dL — ABNORMAL HIGH (ref 0.44–1.00)
GFR calc non Af Amer: 33 mL/min — ABNORMAL LOW (ref >60)
GFR, EST AFRICAN AMERICAN: 38 mL/min — AB (ref >60)
GLUCOSE: 105 mg/dL — AB (ref 65–99)
POTASSIUM: 4.2 mmol/L (ref 3.5–5.1)
SODIUM: 137 mmol/L (ref 135–145)
TOTAL PROTEIN: 6.3 g/dL — AB (ref 6.5–8.1)

## 2016-07-31 LAB — I-STAT TROPONIN, ED
TROPONIN I, POC: 0 ng/mL (ref 0.00–0.08)
TROPONIN I, POC: 0.05 ng/mL (ref 0.00–0.08)

## 2016-07-31 LAB — BRAIN NATRIURETIC PEPTIDE: B NATRIURETIC PEPTIDE 5: 691.2 pg/mL — AB (ref 0.0–100.0)

## 2016-07-31 MED ORDER — IOPAMIDOL (ISOVUE-370) INJECTION 76%
INTRAVENOUS | Status: AC
  Filled 2016-07-31: qty 100

## 2016-07-31 MED ORDER — IOPAMIDOL (ISOVUE-370) INJECTION 76%
80.0000 mL | Freq: Once | INTRAVENOUS | Status: AC | PRN
  Administered 2016-07-31: 100 mL via INTRAVENOUS

## 2016-07-31 MED ORDER — NITROGLYCERIN 0.4 MG SL SUBL
0.4000 mg | SUBLINGUAL_TABLET | SUBLINGUAL | Status: DC | PRN
Start: 2016-07-31 — End: 2016-08-02

## 2016-07-31 MED ORDER — ACETAMINOPHEN 325 MG PO TABS
650.0000 mg | ORAL_TABLET | Freq: Once | ORAL | Status: AC
  Administered 2016-07-31: 650 mg via ORAL
  Filled 2016-07-31: qty 2

## 2016-07-31 NOTE — ED Triage Notes (Signed)
Per EMS: pt from [facility] c/o central CP but denies SOB; pt pain relieved with 324,g ASA and 1 SL; 20g L AC

## 2016-07-31 NOTE — ED Notes (Signed)
Patient transported to X-ray 

## 2016-07-31 NOTE — ED Provider Notes (Signed)
[facility] [place] DEPT Provider Note   CSN: [id number] Arrival date & time: 07/31/16  1843     History   Chief Complaint Chief Complaint  Patient presents with  . Chest Pain    HPI Kathy Hoover is a 82 y.o. female.  HPI   31PM developed severe chest pain, all over chest, radiating to throat Worse with deep breaths Nausea and dry heaves. No vomiting, doesn't think there is shortness of breath. LLE with worsening swelling. Right extremity with weeping from wound which was re-dressed by family doctor today and has no signs of infection per PCP today.   Received aspirin and nitro with EMS with improvement of CP. Now only CP with deep breaths. Developing mild HA   Past Medical History:  Diagnosis Date  . Anxiety   . Anxiety and depression   . Arthritis    "terribly in my hands" (01/13/2014)  . Bacterial pneumonia 01/13/2014  . Breast cancer ([place])   . Cervical disc disease    W radiculopathy S/P epidural injections  . Chronic bronchitis ([place])    "@ least once a year;  in the winter" (01/13/2014)  . Chronic diastolic CHF (congestive heart failure) ([facility])    Preserved EF  . Depression   . Exertional shortness of breath    "worse recently" (01/13/2014)  . Hypertension   . Hypothyroidism   . Lower extremity edema    CHRONIC  . Macular degeneration    Dr Hillis Range Follows for pt.  . Moderate aortic stenosis 05/2012   by ECHO  . Pneumonia    "once before today" (01/13/2014)  . Vitamin D deficiency     Patient Active Problem List   Diagnosis Date Noted  . Chest pain 08/01/2016  . Acute on chronic systolic congestive heart failure ([place]) 07/09/2016  . Cellulitis 07/09/2016  . Acute on chronic diastolic CHF (congestive heart failure) ([place]) 07/09/2016  . NSTEMI (non-ST elevated myocardial infarction) ([place]) 06/05/2015  . Onychomycosis 12/22/2014  . Ingrown nail 12/22/2014  . Pain in lower limb 12/22/2014  . SOB (shortness of breath) 07/28/2014  . Hypoxia 01/13/2014  . Chronic  diastolic heart failure ([place]) 01/13/2014  . Obesity, unspecified 01/13/2014  . Influenza with pneumonia 01/13/2014  . Essential hypertension, benign 12/22/2013  . Acquired stenosis of aortic valve 12/22/2013  . Encounter for long-term (current) use of other medications 12/22/2013    Past Surgical History:  Procedure Laterality Date  . ABDOMINAL HYSTERECTOMY    . APPENDECTOMY  1949  . BREAST BIOPSY Right ~ 2005  . BREAST LUMPECTOMY Right ~ 2005  . CATARACT EXTRACTION W/ INTRAOCULAR LENS  IMPLANT, BILATERAL Bilateral ~ 2008  . TONSILLECTOMY  1949    OB History    No data available       Home Medications    Prior to Admission medications   Medication Sig Start Date End Date Taking? Authorizing Provider  aspirin 81 MG EC tablet Take 1 tablet (81 mg total) by mouth every other day. Patient taking differently: Take 81 mg by mouth every other day. At night 06/28/15  Yes Sueanne Margarita, MD  Cholecalciferol 2000 units CAPS Take 2,000 Units by mouth every evening.   Yes Historical Provider, MD  clobetasol cream (TEMOVATE) [id number] % Apply 1 application topically daily.  07/17/16  Yes Historical Provider, MD  escitalopram (LEXAPRO) 10 MG tablet Take 10 mg by mouth every morning. 07/16/16  Yes Historical Provider, MD  levothyroxine (SYNTHROID, LEVOTHROID) 75 MCG tablet Take 1 tablet (75 mcg  total) by mouth daily before breakfast. 12/28/15  Yes Lelon Perla, MD  Multiple Vitamin (MULTIVITAMIN WITH MINERALS) TABS Take 1 tablet by mouth daily after supper.    Yes Historical Provider, MD  Multiple Vitamins-Minerals (PRESERVISION AREDS 2 PO) Take 2 tablets by mouth every morning.    Yes Historical Provider, MD  nitroGLYCERIN (NITROSTAT) 0.4 MG SL tablet Place 1 tablet (0.4 mg total) under the tongue every 5 (five) minutes as needed for chest pain. 06/13/15  Yes Sueanne Margarita, MD  Polyethyl Glycol-Propyl Glycol (SYSTANE ULTRA) 0.4-0.3 % SOLN Apply 1 drop to eye 2 (two) times daily as needed.   Yes  Historical Provider, MD  potassium chloride 20 MEQ TBCR Take 20 mEq by mouth daily. 07/12/16  Yes Shanker Kristeen Mans, MD  Probiotic Product (SOLUBLE FIBER/PROBIOTICS PO) Take 1 tablet by mouth daily.   Yes Historical Provider, MD  torsemide (DEMADEX) 20 MG tablet Take 2 tablets (40 mg total) by mouth daily. Patient taking differently: Take 40 mg by mouth 2 (two) times daily as needed (40 mg every morning and an additional 20 mg during the day as needed for fluid retention).  07/12/16  Yes Shanker Kristeen Mans, MD  traMADol (ULTRAM) 50 MG tablet Take 100 mg by mouth every morning. 07/20/16  Yes Historical Provider, MD  vitamin C (ASCORBIC ACID) 500 MG tablet Take 500 mg by mouth daily.   Yes Historical Provider, MD  acetaminophen (TYLENOL) 325 MG tablet Take 2 tablets (650 mg total) by mouth every 6 (six) hours as needed (pain or Fever >/= 101). Patient not taking: Reported on 07/31/2016 01/18/14   Modena Jansky, MD    Family History Family History  Problem Relation Age of Onset  . CVA Father   . Pneumonia Father   . Multiple sclerosis Brother   . CVA Brother     Social History Social History  Substance Use Topics  . Smoking status: Never Smoker  . Smokeless tobacco: Never Used  . Alcohol use No     Allergies   Penicillins; Ciprofloxacin; Codeine; and Metronidazole   Review of Systems Review of Systems  Constitutional: Negative for fever.  HENT: Negative for sore throat.   Eyes: Negative for visual disturbance.  Respiratory: Positive for cough. Negative for shortness of breath.   Cardiovascular: Positive for chest pain and leg swelling.  Gastrointestinal: Positive for nausea. Negative for abdominal pain, constipation and vomiting.  Genitourinary: Negative for difficulty urinating.  Musculoskeletal: Positive for arthralgias. Negative for back pain and neck pain.  Skin: Positive for wound. Negative for rash.  Neurological: Positive for headaches. Negative for syncope.      Physical Exam Updated Vital Signs BP 122/67   Pulse (!) 45   Temp 98.2 F (36.8 C) (Oral)   Resp 17   SpO2 95%   Physical Exam  Constitutional: She is oriented to person, place, and time. She appears well-developed and well-nourished. No distress.  HENT:  Head: Normocephalic and atraumatic.  Eyes: Conjunctivae and EOM are normal.  Neck: Normal range of motion.  Cardiovascular: Normal rate, regular rhythm, normal heart sounds and intact distal pulses.  Exam reveals no gallop and no friction rub.   No murmur heard. Pulmonary/Chest: Effort normal. No respiratory distress. She has no wheezes. She has rales.  Abdominal: Soft. She exhibits no distension. There is no tenderness. There is no guarding.  Musculoskeletal: She exhibits edema. She exhibits no tenderness.  RLE under plaster compression dressing, LLE edema, 2+pulses  Neurological: She is  alert and oriented to person, place, and time.  Skin: Skin is warm and dry. No rash noted. She is not diaphoretic. There is erythema (chronic skin changes LLE).  Nursing note and vitals reviewed.    ED Treatments / Results  Labs (all labs ordered are listed, but only abnormal results are displayed) Labs Reviewed  CBC WITH DIFFERENTIAL/PLATELET - Abnormal; Notable for the following:       Result Value   RBC 3.21 (*)    Hemoglobin 9.7 (*)    HCT 31.0 (*)    All other components within normal limits  COMPREHENSIVE METABOLIC PANEL - Abnormal; Notable for the following:    Chloride 100 (*)    Glucose, Bld 105 (*)    Creatinine, Ser 1.30 (*)    Total Protein 6.3 (*)    Albumin 3.4 (*)    GFR calc non Af Amer 33 (*)    GFR calc Af Amer 38 (*)    All other components within normal limits  BRAIN NATRIURETIC PEPTIDE - Abnormal; Notable for the following:    B Natriuretic Peptide 691.2 (*)    All other components within normal limits  TROPONIN I  TROPONIN I  TROPONIN I  I-STAT TROPOININ, ED  I-STAT TROPOININ, ED  I-STAT TROPOININ,  ED    EKG  EKG Interpretation  Date/Time:  Tuesday July 31 2016 19:09:44 EDT Ventricular Rate:  56 PR Interval:    QRS Duration: 93 QT Interval:  478 QTC Calculation: 462 R Axis:   -32 Text Interpretation:  Sinus rhythm Left axis deviation Anteroseptal infarct, age indeterminate Decreased number of PVCs compared to last ECG Confirmed by [facility] [place] MD, [place] ([id number]) on 08/01/2016 12:19:36 AM       Radiology Dg Chest 2 View  Result Date: 07/31/2016 CLINICAL DATA:  Chest pain EXAM: CHEST  2 VIEW COMPARISON:  07/09/2016 FINDINGS: Stable cardiomegaly without edema, CHF, or focal pneumonia. Mitral valve annulus calcifications noted. Thoracic aortic atherosclerosis present. Postop changes of the right breast and axilla. Degenerative changes of the spine. No significant interval change. IMPRESSION: Cardiomegaly without CHF or pneumonia Mitral valve annulus calcifications and thoracic aortic atherosclerosis Stable chest exam. Electronically Signed   By: Jerilynn Mages.  Shick M.D.   On: 07/31/2016 20:29   Ct Angio Chest Pe W And/or Wo Contrast  Result Date: 07/31/2016 CLINICAL DATA:  Central chest pain.  History of aortic stenosis. EXAM: CT ANGIOGRAPHY CHEST WITH CONTRAST TECHNIQUE: Multidetector CT imaging of the chest was performed using the standard protocol during bolus administration of intravenous contrast. Multiplanar CT image reconstructions and MIPs were obtained to evaluate the vascular anatomy. CONTRAST:  70 mL Isovue 370 COMPARISON:  None. FINDINGS: Cardiovascular: Satisfactory opacification of the pulmonary arteries to the segmental level. No evidence of pulmonary embolism. Diffuse cardiac enlargement. Calcification in the mitral valve annulus. Dilated ascending thoracic aorta measuring 4.5 cm maximal AP diameter. Scattered calcifications in the aorta. No pericardial effusion. Mediastinum/Nodes: No enlarged mediastinal, hilar, or axillary lymph nodes. Thyroid gland, trachea, and esophagus  demonstrate no significant findings. Surgical clips in the right axilla. Lungs/Pleura: Patchy mosaic attenuation pattern to the lungs could be due to motion artifact, air trapping, or edema. No pleural effusions. No pneumothorax. Upper Abdomen: Reflux of contrast material into the hepatic veins suggests right heart failure. Vascular calcifications. Musculoskeletal: Diffuse degenerative changes throughout the thoracic spine. No destructive bone lesions. Review of the MIP images confirms the above findings. IMPRESSION: No evidence of significant pulmonary embolus. Cardiac enlargement with signs of right heart  failure. 4.5 cm dilated ascending thoracic aorta. Aortic atherosclerosis. Mosaic attenuation pattern to the lungs could be due to motion artifact, edema, or air trapping. Electronically Signed   By: Lucienne Capers M.D.   On: 07/31/2016 23:34   Dg Hip Unilat With Pelvis 2-3 Views Right  Result Date: 08/01/2016 CLINICAL DATA:  Chronic right hip pain.  Initial encounter. EXAM: DG HIP (WITH OR WITHOUT PELVIS) 2-3V RIGHT COMPARISON:  None. FINDINGS: There is chronic superior joint space narrowing at the right hip. Mild axial joint space narrowing is noted at the left hip. There is diffuse sclerotic change at the right hip joint, with mild cortical irregularity. Mild sclerotic change is noted at the sacroiliac joints bilaterally. Scattered vascular calcifications are seen. IMPRESSION: 1. Chronic superior joint space narrowing at the right hip, with mild cortical irregularity and diffuse sclerotic change, reflecting osteoarthritis. 2. Mild axial joint space narrowing at the left hip. 3. Scattered vascular calcifications seen. Electronically Signed   By: Garald Balding M.D.   On: 08/01/2016 00:54    Procedures Procedures (including critical care time)  Medications Ordered in ED Medications  nitroGLYCERIN (NITROSTAT) SL tablet 0.4 mg (not administered)  aspirin EC tablet 81 mg (not administered)   escitalopram (LEXAPRO) tablet 10 mg (not administered)  levothyroxine (SYNTHROID, LEVOTHROID) tablet 75 mcg (not administered)  multivitamin with minerals tablet 1 tablet (not administered)  torsemide (DEMADEX) tablet 40 mg (not administered)  traMADol (ULTRAM) tablet 100 mg (not administered)  Potassium Chloride ER TBCR 20 mEq (not administered)  Polyethyl Glycol-Propyl Glycol 0.4-0.3 % SOLN 1 drop (not administered)  acetaminophen (TYLENOL) tablet 650 mg (not administered)  ondansetron (ZOFRAN) injection 4 mg (not administered)  enoxaparin (LOVENOX) injection 40 mg (not administered)  torsemide (DEMADEX) tablet 20 mg (not administered)  acetaminophen (TYLENOL) tablet 650 mg (650 mg Oral Given 07/31/16 2231)  iopamidol (ISOVUE-370) 76 % injection 80 mL (100 mLs Intravenous Contrast Given 07/31/16 2304)     Initial Impression / Assessment and Plan / ED Course  I have reviewed the triage vital signs and the nursing notes.  Pertinent labs & imaging results that were available during my care of the patient were reviewed by me and considered in my medical decision making (see chart for details).  Clinical Course   82yo female with hx of htn, hlpd, aortic stenosis, CHF, presents with concern for chest pain. Patient with LE swelling and sharp, pleuritic component of pain and CT PE study done and negative.  EKG with PVC however no acute changes. Troponin initially negative at 0.00, then 0.05 on recheck.  Will admit for chest pain rule out.   Final Clinical Impressions(s) / ED Diagnoses   Final diagnoses:  Chest pain, unspecified chest pain type    New Prescriptions New Prescriptions   No medications on file     Gareth Morgan, MD 08/01/16 0131

## 2016-08-01 ENCOUNTER — Encounter (HOSPITAL_COMMUNITY): Payer: Self-pay | Admitting: General Practice

## 2016-08-01 ENCOUNTER — Observation Stay (HOSPITAL_COMMUNITY): Payer: Medicare Other

## 2016-08-01 DIAGNOSIS — K5900 Constipation, unspecified: Secondary | ICD-10-CM | POA: Diagnosis not present

## 2016-08-01 DIAGNOSIS — I5032 Chronic diastolic (congestive) heart failure: Secondary | ICD-10-CM

## 2016-08-01 DIAGNOSIS — R627 Adult failure to thrive: Secondary | ICD-10-CM

## 2016-08-01 DIAGNOSIS — R079 Chest pain, unspecified: Secondary | ICD-10-CM

## 2016-08-01 DIAGNOSIS — I350 Nonrheumatic aortic (valve) stenosis: Secondary | ICD-10-CM

## 2016-08-01 DIAGNOSIS — R0789 Other chest pain: Secondary | ICD-10-CM | POA: Diagnosis not present

## 2016-08-01 DIAGNOSIS — M25551 Pain in right hip: Secondary | ICD-10-CM | POA: Diagnosis not present

## 2016-08-01 DIAGNOSIS — K5909 Other constipation: Secondary | ICD-10-CM

## 2016-08-01 LAB — TROPONIN I
Troponin I: 0.04 ng/mL (ref <0.03)
Troponin I: 0.04 ng/mL (ref <0.03)

## 2016-08-01 LAB — I-STAT TROPONIN, ED: TROPONIN I, POC: 0.05 ng/mL (ref 0.00–0.08)

## 2016-08-01 LAB — MRSA PCR SCREENING: MRSA BY PCR: NEGATIVE

## 2016-08-01 MED ORDER — TORSEMIDE 20 MG PO TABS: 20.0000 mg | ORAL_TABLET | Freq: Every day | ORAL | Status: DC | PRN

## 2016-08-01 MED ORDER — INFLUENZA VAC SPLIT QUAD 0.5 ML IM SUSY
0.5000 mL | PREFILLED_SYRINGE | INTRAMUSCULAR | Status: AC
  Administered 2016-08-02: 0.5 mL via INTRAMUSCULAR
  Filled 2016-08-01: qty 0.5

## 2016-08-01 MED ORDER — FAMOTIDINE 20 MG PO TABS
20.0000 mg | ORAL_TABLET | Freq: Two times a day (BID) | ORAL | Status: DC
  Administered 2016-08-01 – 2016-08-02 (×3): 20 mg via ORAL
  Filled 2016-08-01 (×3): qty 1

## 2016-08-01 MED ORDER — ENOXAPARIN SODIUM 30 MG/0.3ML ~~LOC~~ SOLN
30.0000 mg | Freq: Every day | SUBCUTANEOUS | Status: DC
Start: 2016-08-01 — End: 2016-08-02
  Administered 2016-08-01 – 2016-08-02 (×2): 30 mg via SUBCUTANEOUS
  Filled 2016-08-01 (×2): qty 0.3

## 2016-08-01 MED ORDER — ONDANSETRON HCL 4 MG/2ML IJ SOLN: 4.0000 mg | Freq: Four times a day (QID) | INTRAMUSCULAR | Status: DC | PRN

## 2016-08-01 MED ORDER — ADULT MULTIVITAMIN W/MINERALS CH
1.0000 | ORAL_TABLET | Freq: Every day | ORAL | Status: DC
  Administered 2016-08-01: 1 via ORAL
  Filled 2016-08-01: qty 1

## 2016-08-01 MED ORDER — LEVOTHYROXINE SODIUM 75 MCG PO TABS
75.0000 ug | ORAL_TABLET | Freq: Every day | ORAL | Status: DC
  Administered 2016-08-01 – 2016-08-02 (×2): 75 ug via ORAL
  Filled 2016-08-01 (×2): qty 1

## 2016-08-01 MED ORDER — POTASSIUM CHLORIDE CRYS ER 20 MEQ PO TBCR
20.0000 meq | EXTENDED_RELEASE_TABLET | Freq: Every day | ORAL | Status: DC
  Administered 2016-08-01: 20 meq via ORAL
  Filled 2016-08-01: qty 1

## 2016-08-01 MED ORDER — ISOSORBIDE MONONITRATE ER 30 MG PO TB24
30.0000 mg | ORAL_TABLET | Freq: Every day | ORAL | Status: DC
  Administered 2016-08-01 – 2016-08-02 (×2): 30 mg via ORAL
  Filled 2016-08-01 (×2): qty 1

## 2016-08-01 MED ORDER — ESCITALOPRAM OXALATE 10 MG PO TABS
10.0000 mg | ORAL_TABLET | Freq: Every day | ORAL | Status: DC
  Administered 2016-08-01 – 2016-08-02 (×2): 10 mg via ORAL
  Filled 2016-08-01 (×2): qty 1

## 2016-08-01 MED ORDER — ASPIRIN EC 81 MG PO TBEC
81.0000 mg | DELAYED_RELEASE_TABLET | ORAL | Status: DC
  Administered 2016-08-01: 81 mg via ORAL
  Filled 2016-08-01: qty 1

## 2016-08-01 MED ORDER — POLYVINYL ALCOHOL 1.4 % OP SOLN: 1.0000 [drp] | Freq: Two times a day (BID) | OPHTHALMIC | Status: DC | PRN

## 2016-08-01 MED ORDER — TORSEMIDE 20 MG PO TABS
40.0000 mg | ORAL_TABLET | Freq: Every day | ORAL | Status: DC
  Administered 2016-08-01 – 2016-08-02 (×2): 40 mg via ORAL
  Filled 2016-08-01 (×2): qty 2

## 2016-08-01 MED ORDER — TRAMADOL HCL 50 MG PO TABS
100.0000 mg | ORAL_TABLET | Freq: Every day | ORAL | Status: DC
  Administered 2016-08-01 – 2016-08-02 (×2): 100 mg via ORAL
  Filled 2016-08-01 (×2): qty 2

## 2016-08-01 MED ORDER — ACETAMINOPHEN 325 MG PO TABS: 650.0000 mg | ORAL_TABLET | Freq: Four times a day (QID) | ORAL | Status: DC | PRN

## 2016-08-01 NOTE — ED Notes (Signed)
Pt to Xray.

## 2016-08-01 NOTE — Progress Notes (Signed)
OT Cancellation Note  Patient Details Name: Kathy Hoover MRN: [id number] DOB: 21-Dec-1915   Cancelled Treatment:    Reason Eval/Treat Not Completed: Medical issues which prohibited therapy;Fatigue/lethargy limiting ability to participate. Pt having issues with  Troponin per MD and PT and she also states pt may be going to stress test and to hold off on OT session at this time. Will re attempt later today as able/as appropriate Britt Bottom 08/01/2016, 10:14 AM

## 2016-08-01 NOTE — Progress Notes (Signed)
PROGRESS NOTE  Kathy Hoover [id number] DOB: [birth year] DOA: 07/31/2016 PCP: Anthoney Harada, MD  HPI/Recap of past 24 hours:  No chest pain this am, two sons in the room  Assessment/Plan: Principal Problem:   Chest pain    Chest pain,  With mild flat troponin, ekg interval changes vs leads placement in anterioseptal leads, cardiology consulted ( son requested to see cardiology as well) Chest pain could also from gi issues, patient did endorse dry heaving yesterday, will get DG esophagus,   Lower extremity edema: h/o diastolic chf, continue demadex  Cr elevation, cr 1.3, baseline 1.1-1.2, continue monitor closely, ua pending  Left lower abdominal pain with intermittent constipation and diarrhea, lft wnl, no fever, no leukocytosis, kub pending  Hypothyroidism: continue home meds, tsh pending  FTT: I have discussed with family, they prefer patient go back to independent living for now, will get pt/ot eval, likely will benefit from home health  Code Status: DNR  Family Communication: patient and her two sons Jeneen Rinks and Jenny Reichmann in the room  Disposition Plan: possible d/c in am   Consultants:  cardiology  Procedures:  none  Antibiotics:  none   Objective: BP 139/62 (BP Location: Left Arm)   Pulse (!) 53   Temp 98.3 F (36.8 C) (Oral)   Resp 18   Wt 76.6 kg (168 lb 12.8 oz)   SpO2 96%   BMI 34.09 kg/m   Intake/Output Summary (Last 24 hours) at 08/01/16 1227 Last data filed at 08/01/16 0805  Gross per 24 hour  Intake                0 ml  Output              550 ml  Net             -550 ml   Filed Weights   08/01/16 0254  Weight: 76.6 kg (168 lb 12.8 oz)    Exam:   General:  NAD, frail elderly female, aaox3  Cardiovascular: RRR, + murmur  Respiratory: CTABL  Abdomen: Soft/ND/NT, positive BS  Musculoskeletal: + bilateral lower extremity Edema  Neuro: aaox3  Data Reviewed: Basic Metabolic Panel:  Recent Labs Lab 07/31/16 2021  NA  137  K 4.2  CL 100*  CO2 29  GLUCOSE 105*  BUN 20  CREATININE 1.30*  CALCIUM 9.2   Liver Function Tests:  Recent Labs Lab 07/31/16 2021  AST 20  ALT 15  ALKPHOS 82  BILITOT 0.8  PROT 6.3*  ALBUMIN 3.4*   No results for input(s): LIPASE, AMYLASE in the last 168 hours. No results for input(s): AMMONIA in the last 168 hours. CBC:  Recent Labs Lab 07/31/16 2021  WBC 6.3  NEUTROABS 3.7  HGB 9.7*  HCT 31.0*  MCV 96.6  PLT 177   Cardiac Enzymes:    Recent Labs Lab 08/01/16 0431 08/01/16 0823  TROPONINI 0.04* 0.04*   BNP (last 3 results)  Recent Labs  12/03/15 1724 07/09/16 1631 07/31/16 1914  BNP 311.6* 413.8* 691.2*    ProBNP (last 3 results) No results for input(s): PROBNP in the last 8760 hours.  CBG: No results for input(s): GLUCAP in the last 168 hours.  Recent Results (from the past 240 hour(s))  MRSA PCR Screening     Status: None   Collection Time: 08/01/16  3:17 AM  Result Value Ref Range Status   MRSA by PCR NEGATIVE NEGATIVE Final    Comment:  The GeneXpert MRSA Assay (FDA approved for NASAL specimens only), is one component of a comprehensive MRSA colonization surveillance program. It is not intended to diagnose MRSA infection nor to guide or monitor treatment for MRSA infections.      Studies: Dg Chest 2 View  Result Date: 07/31/2016 CLINICAL DATA:  Chest pain EXAM: CHEST  2 VIEW COMPARISON:  07/09/2016 FINDINGS: Stable cardiomegaly without edema, CHF, or focal pneumonia. Mitral valve annulus calcifications noted. Thoracic aortic atherosclerosis present. Postop changes of the right breast and axilla. Degenerative changes of the spine. No significant interval change. IMPRESSION: Cardiomegaly without CHF or pneumonia Mitral valve annulus calcifications and thoracic aortic atherosclerosis Stable chest exam. Electronically Signed   By: Jerilynn Mages.  Shick M.D.   On: 07/31/2016 20:29   Ct Angio Chest Pe W And/or Wo Contrast  Result  Date: 07/31/2016 CLINICAL DATA:  Central chest pain.  History of aortic stenosis. EXAM: CT ANGIOGRAPHY CHEST WITH CONTRAST TECHNIQUE: Multidetector CT imaging of the chest was performed using the standard protocol during bolus administration of intravenous contrast. Multiplanar CT image reconstructions and MIPs were obtained to evaluate the vascular anatomy. CONTRAST:  70 mL Isovue 370 COMPARISON:  None. FINDINGS: Cardiovascular: Satisfactory opacification of the pulmonary arteries to the segmental level. No evidence of pulmonary embolism. Diffuse cardiac enlargement. Calcification in the mitral valve annulus. Dilated ascending thoracic aorta measuring 4.5 cm maximal AP diameter. Scattered calcifications in the aorta. No pericardial effusion. Mediastinum/Nodes: No enlarged mediastinal, hilar, or axillary lymph nodes. Thyroid gland, trachea, and esophagus demonstrate no significant findings. Surgical clips in the right axilla. Lungs/Pleura: Patchy mosaic attenuation pattern to the lungs could be due to motion artifact, air trapping, or edema. No pleural effusions. No pneumothorax. Upper Abdomen: Reflux of contrast material into the hepatic veins suggests right heart failure. Vascular calcifications. Musculoskeletal: Diffuse degenerative changes throughout the thoracic spine. No destructive bone lesions. Review of the MIP images confirms the above findings. IMPRESSION: No evidence of significant pulmonary embolus. Cardiac enlargement with signs of right heart failure. 4.5 cm dilated ascending thoracic aorta. Aortic atherosclerosis. Mosaic attenuation pattern to the lungs could be due to motion artifact, edema, or air trapping. Electronically Signed   By: Lucienne Capers M.D.   On: 07/31/2016 23:34   Dg Hip Unilat With Pelvis 2-3 Views Right  Result Date: 08/01/2016 CLINICAL DATA:  Chronic right hip pain.  Initial encounter. EXAM: DG HIP (WITH OR WITHOUT PELVIS) 2-3V RIGHT COMPARISON:  None. FINDINGS: There is  chronic superior joint space narrowing at the right hip. Mild axial joint space narrowing is noted at the left hip. There is diffuse sclerotic change at the right hip joint, with mild cortical irregularity. Mild sclerotic change is noted at the sacroiliac joints bilaterally. Scattered vascular calcifications are seen. IMPRESSION: 1. Chronic superior joint space narrowing at the right hip, with mild cortical irregularity and diffuse sclerotic change, reflecting osteoarthritis. 2. Mild axial joint space narrowing at the left hip. 3. Scattered vascular calcifications seen. Electronically Signed   By: Garald Balding M.D.   On: 08/01/2016 00:54    Scheduled Meds: . aspirin EC  81 mg Oral Q48H  . enoxaparin (LOVENOX) injection  30 mg Subcutaneous Daily  . escitalopram  10 mg Oral Daily  . famotidine  20 mg Oral BID  . levothyroxine  75 mcg Oral QAC breakfast  . multivitamin with minerals  1 tablet Oral QPC supper  . potassium chloride SA  20 mEq Oral Daily  . torsemide  40 mg Oral Q  breakfast  . traMADol  100 mg Oral Q breakfast    Continuous Infusions:    Time spent: 48mins from 12noon to 12:35pm  Ambriel Gorelick MD, PhD  [facility] Pager [phone]. If 7PM-7AM, please contact night-coverage at [email or web address], password [facility] 08/01/2016, 12:27 PM  LOS: 0 days

## 2016-08-01 NOTE — Progress Notes (Signed)
PT Cancellation Note  Patient Details Name: Kathy Hoover MRN: [id number] DOB: Nov 20, 1915   Cancelled Treatment:    Reason Eval/Treat Not Completed: Medical issues which prohibited therapy.  Spoke with Dr. Erlinda Hong regarding Troponin and she also states pt may be going to stress test and to hold off on PT session at this time.  Will check back as schedule permits to complete eval.   [name] 08/01/2016, 9:25 AM

## 2016-08-01 NOTE — H&P (Signed)
History and Physical    Kathy Hoover [id number] DOB: 24-Jul-1916 DOA: 07/31/2016   PCP: Anthoney Harada, MD Chief Complaint:  Chief Complaint  Patient presents with  . Chest Pain    HPI: Kathy Hoover is a 82 y.o. female with medical history significant of Diastolic CHF grade 3, moderate AS, mild MR.  Patient presents to ED after developing severe central chest pain with radiation to throat at 530pm.  Relieved with 324 ASA and 1 SL NTG.  Pain free at this time.  ED Course: Remains pain free in ED, trop neg.  Review of Systems: As per HPI otherwise 10 point review of systems negative.    Past Medical History:  Diagnosis Date  . Anxiety   . Anxiety and depression   . Arthritis    "terribly in my hands" (01/13/2014)  . Bacterial pneumonia 01/13/2014  . Breast cancer ([place])   . Cervical disc disease    W radiculopathy S/P epidural injections  . Chronic bronchitis ([place])    "@ least once a year;  in the winter" (01/13/2014)  . Chronic diastolic CHF (congestive heart failure) ([facility])    Preserved EF  . Depression   . Exertional shortness of breath    "worse recently" (01/13/2014)  . Hypertension   . Hypothyroidism   . Lower extremity edema    CHRONIC  . Macular degeneration    Dr Hillis Range Follows for pt.  . Moderate aortic stenosis 05/2012   by ECHO  . Pneumonia    "once before today" (01/13/2014)  . Vitamin D deficiency     Past Surgical History:  Procedure Laterality Date  . ABDOMINAL HYSTERECTOMY    . APPENDECTOMY  1949  . BREAST BIOPSY Right ~ 2005  . BREAST LUMPECTOMY Right ~ 2005  . CATARACT EXTRACTION W/ INTRAOCULAR LENS  IMPLANT, BILATERAL Bilateral ~ 2008  . TONSILLECTOMY  1949     reports that she has never smoked. She has never used smokeless tobacco. She reports that she does not drink alcohol or use drugs.  Allergies  Allergen Reactions  . Penicillins Swelling    Lip swelling, Has patient had a PCN reaction causing immediate rash, facial/tongue/throat  swelling, SOB or lightheadedness with hypotension: Yes Has patient had a PCN reaction causing severe rash involving mucus membranes or skin necrosis: No Has patient had a PCN reaction that required hospitalization No Has patient had a PCN reaction occurring within the last 10 years: No If all of the above answers are "NO", then may proceed with Cephalosporin use.   . Ciprofloxacin Nausea And Vomiting  . Codeine Swelling    Lip Swelling  . Metronidazole Itching    Family History  Problem Relation Age of Onset  . CVA Father   . Pneumonia Father   . Multiple sclerosis Brother   . CVA Brother       Prior to Admission medications   Medication Sig Start Date End Date Taking? Authorizing Provider  aspirin 81 MG EC tablet Take 1 tablet (81 mg total) by mouth every other day. Patient taking differently: Take 81 mg by mouth every other day. At night 06/28/15  Yes Sueanne Margarita, MD  Cholecalciferol 2000 units CAPS Take 2,000 Units by mouth every evening.   Yes Historical Provider, MD  clobetasol cream (TEMOVATE) [id number] % Apply 1 application topically daily.  07/17/16  Yes Historical Provider, MD  escitalopram (LEXAPRO) 10 MG tablet Take 10 mg by mouth every morning. 07/16/16  Yes Historical Provider, MD  levothyroxine (SYNTHROID, LEVOTHROID) 75 MCG tablet Take 1 tablet (75 mcg total) by mouth daily before breakfast. 12/28/15  Yes Lelon Perla, MD  Multiple Vitamin (MULTIVITAMIN WITH MINERALS) TABS Take 1 tablet by mouth daily after supper.    Yes Historical Provider, MD  Multiple Vitamins-Minerals (PRESERVISION AREDS 2 PO) Take 2 tablets by mouth every morning.    Yes Historical Provider, MD  nitroGLYCERIN (NITROSTAT) 0.4 MG SL tablet Place 1 tablet (0.4 mg total) under the tongue every 5 (five) minutes as needed for chest pain. 06/13/15  Yes Sueanne Margarita, MD  Polyethyl Glycol-Propyl Glycol (SYSTANE ULTRA) 0.4-0.3 % SOLN Apply 1 drop to eye 2 (two) times daily as needed.   Yes Historical Provider,  MD  potassium chloride 20 MEQ TBCR Take 20 mEq by mouth daily. 07/12/16  Yes Shanker Kristeen Mans, MD  Probiotic Product (SOLUBLE FIBER/PROBIOTICS PO) Take 1 tablet by mouth daily.   Yes Historical Provider, MD  torsemide (DEMADEX) 20 MG tablet Take 2 tablets (40 mg total) by mouth daily. Patient taking differently: Take 40 mg by mouth 2 (two) times daily as needed (40 mg every morning and an additional 20 mg during the day as needed for fluid retention).  07/12/16  Yes Shanker Kristeen Mans, MD  traMADol (ULTRAM) 50 MG tablet Take 100 mg by mouth every morning. 07/20/16  Yes Historical Provider, MD  vitamin C (ASCORBIC ACID) 500 MG tablet Take 500 mg by mouth daily.   Yes Historical Provider, MD  acetaminophen (TYLENOL) 325 MG tablet Take 2 tablets (650 mg total) by mouth every 6 (six) hours as needed (pain or Fever >/= 101). Patient not taking: Reported on 07/31/2016 01/18/14   Modena Jansky, MD    Physical Exam: Vitals:   07/31/16 1945 07/31/16 2232 08/01/16 0023 08/01/16 0030  BP: 108/57 128/97 113/60 122/67  Pulse: (!) 55 (!) 37 67 (!) 45  Resp: 18 16 21 17   Temp:      TempSrc:      SpO2: 98% 97% 95% 95%      Constitutional: NAD, calm, comfortable Eyes: PERRL, lids and conjunctivae normal ENMT: Mucous membranes are moist. Posterior pharynx clear of any exudate or lesions.Normal dentition.  Neck: normal, supple, no masses, no thyromegaly Respiratory: clear to auscultation bilaterally, no wheezing, no crackles. Normal respiratory effort. No accessory muscle use.  Cardiovascular: Regular rate and rhythm, no murmurs / rubs / gallops. No extremity edema. 2+ pedal pulses. No carotid bruits.  Abdomen: no tenderness, no masses palpated. No hepatosplenomegaly. Bowel sounds positive.  Musculoskeletal: no clubbing / cyanosis. No joint deformity upper and lower extremities. Good ROM, no contractures. Normal muscle tone.  Skin: no rashes, lesions, ulcers. No induration Neurologic: CN 2-12 grossly  intact. Sensation intact, DTR normal. Strength 5/5 in all 4.  Psychiatric: Normal judgment and insight. Alert and oriented x 3. Normal mood.    Labs on Admission: I have personally reviewed following labs and imaging studies  CBC:  Recent Labs Lab 07/31/16 2021  WBC 6.3  NEUTROABS 3.7  HGB 9.7*  HCT 31.0*  MCV 96.6  PLT 123XX123   Basic Metabolic Panel:  Recent Labs Lab 07/31/16 2021  NA 137  K 4.2  CL 100*  CO2 29  GLUCOSE 105*  BUN 20  CREATININE 1.30*  CALCIUM 9.2   GFR: CrCl cannot be calculated (Unknown ideal weight.). Liver Function Tests:  Recent Labs Lab 07/31/16 2021  AST 20  ALT 15  ALKPHOS 82  BILITOT 0.8  PROT  6.3*  ALBUMIN 3.4*   No results for input(s): LIPASE, AMYLASE in the last 168 hours. No results for input(s): AMMONIA in the last 168 hours. Coagulation Profile: No results for input(s): INR, PROTIME in the last 168 hours. Cardiac Enzymes: No results for input(s): CKTOTAL, CKMB, CKMBINDEX, TROPONINI in the last 168 hours. BNP (last 3 results) No results for input(s): PROBNP in the last 8760 hours. HbA1C: No results for input(s): HGBA1C in the last 72 hours. CBG: No results for input(s): GLUCAP in the last 168 hours. Lipid Profile: No results for input(s): CHOL, HDL, LDLCALC, TRIG, CHOLHDL, LDLDIRECT in the last 72 hours. Thyroid Function Tests: No results for input(s): TSH, T4TOTAL, FREET4, T3FREE, THYROIDAB in the last 72 hours. Anemia Panel: No results for input(s): VITAMINB12, FOLATE, FERRITIN, TIBC, IRON, RETICCTPCT in the last 72 hours. Urine analysis: No results found for: COLORURINE, APPEARANCEUR, LABSPEC, PHURINE, GLUCOSEU, HGBUR, BILIRUBINUR, KETONESUR, PROTEINUR, UROBILINOGEN, NITRITE, LEUKOCYTESUR Sepsis Labs: @LABRCNTIP (procalcitonin:4,lacticidven:4) )No results found for this or any previous visit (from the past 240 hour(s)).   Radiological Exams on Admission: Dg Chest 2 View  Result Date: 07/31/2016 CLINICAL DATA:   Chest pain EXAM: CHEST  2 VIEW COMPARISON:  07/09/2016 FINDINGS: Stable cardiomegaly without edema, CHF, or focal pneumonia. Mitral valve annulus calcifications noted. Thoracic aortic atherosclerosis present. Postop changes of the right breast and axilla. Degenerative changes of the spine. No significant interval change. IMPRESSION: Cardiomegaly without CHF or pneumonia Mitral valve annulus calcifications and thoracic aortic atherosclerosis Stable chest exam. Electronically Signed   By: Jerilynn Mages.  Shick M.D.   On: 07/31/2016 20:29   Ct Angio Chest Pe W And/or Wo Contrast  Result Date: 07/31/2016 CLINICAL DATA:  Central chest pain.  History of aortic stenosis. EXAM: CT ANGIOGRAPHY CHEST WITH CONTRAST TECHNIQUE: Multidetector CT imaging of the chest was performed using the standard protocol during bolus administration of intravenous contrast. Multiplanar CT image reconstructions and MIPs were obtained to evaluate the vascular anatomy. CONTRAST:  70 mL Isovue 370 COMPARISON:  None. FINDINGS: Cardiovascular: Satisfactory opacification of the pulmonary arteries to the segmental level. No evidence of pulmonary embolism. Diffuse cardiac enlargement. Calcification in the mitral valve annulus. Dilated ascending thoracic aorta measuring 4.5 cm maximal AP diameter. Scattered calcifications in the aorta. No pericardial effusion. Mediastinum/Nodes: No enlarged mediastinal, hilar, or axillary lymph nodes. Thyroid gland, trachea, and esophagus demonstrate no significant findings. Surgical clips in the right axilla. Lungs/Pleura: Patchy mosaic attenuation pattern to the lungs could be due to motion artifact, air trapping, or edema. No pleural effusions. No pneumothorax. Upper Abdomen: Reflux of contrast material into the hepatic veins suggests right heart failure. Vascular calcifications. Musculoskeletal: Diffuse degenerative changes throughout the thoracic spine. No destructive bone lesions. Review of the MIP images confirms the  above findings. IMPRESSION: No evidence of significant pulmonary embolus. Cardiac enlargement with signs of right heart failure. 4.5 cm dilated ascending thoracic aorta. Aortic atherosclerosis. Mosaic attenuation pattern to the lungs could be due to motion artifact, edema, or air trapping. Electronically Signed   By: Lucienne Capers M.D.   On: 07/31/2016 23:34   Dg Hip Unilat With Pelvis 2-3 Views Right  Result Date: 08/01/2016 CLINICAL DATA:  Chronic right hip pain.  Initial encounter. EXAM: DG HIP (WITH OR WITHOUT PELVIS) 2-3V RIGHT COMPARISON:  None. FINDINGS: There is chronic superior joint space narrowing at the right hip. Mild axial joint space narrowing is noted at the left hip. There is diffuse sclerotic change at the right hip joint, with mild cortical irregularity. Mild sclerotic  change is noted at the sacroiliac joints bilaterally. Scattered vascular calcifications are seen. IMPRESSION: 1. Chronic superior joint space narrowing at the right hip, with mild cortical irregularity and diffuse sclerotic change, reflecting osteoarthritis. 2. Mild axial joint space narrowing at the left hip. 3. Scattered vascular calcifications seen. Electronically Signed   By: Garald Balding M.D.   On: 08/01/2016 00:54    EKG: Independently reviewed.  Assessment/Plan Active Problems:   Chest pain    1. Chest pain - 1. CP obs pathway 2. Serial trops 3. NPO 4. Call cards in AM and see what they want to do   DVT prophylaxis: Lovenox Code Status: DNR Family Communication: Family at bedside Consults called: None Admission status: Place in [place], [place] Hospital[facility] Pager [phone] from 7PM-7AM  If 7AM-7PM, please contact the day physician for the patient [email or web address] Password [email or web address]  08/01/2016, 1:06 AM

## 2016-08-01 NOTE — Care Management Obs Status (Signed)
[facility] NOTIFICATION   Patient Details  Name: Kathy Hoover MRN: [id number] Date of Birth: July 05, 1916   Medicare Observation Status Notification Given:  Yes    [name] Thrall, RN 08/01/2016, 2:08 PM

## 2016-08-01 NOTE — Consult Note (Signed)
CARDIOLOGY CONSULT NOTE   Patient ID: Kathy Hoover MRN: [id number] DOB/AGE: 80-07-1916 80 y.o.  Admit date: 07/31/2016  Requesting Physician: Dr. Erlinda Hong  Primary Physician:   Anthoney Harada, MD Primary Cardiologist:  Dr. Stanford Hoover  Reason for Consultation: chest pain   HPI: Kathy Hoover is a 82 y.o. female with a history of hypothyroidism, HTN, moderate AS and chronic diastolic heart failure who presented to [facility] last night with chest pain.   Echocardiogram obtained in 02/2014 showed moderate AS with mean gradient 28 mmHg. Repeat echocardiogram in July 2016 showed normal LV function, mild AF, moderate AI, mild MS, PA peak pressure 45 mmHg. Given her advanced age, plan for conservative therapy, no plan for repeat echocardiogram based on Dr. Jacalyn Hoover office visit in February 2017. She is currently living in assisted living facility. Her Lasix was increased to 80 mg for 3 days in June with minimal improvement. Metolazone 2.5 mg every other day, her condition improved after 2 doses. She did not take more because it made her sick with stools with diarrhea.   She was seen in the office on 07/04/16 by Almyra Deforest PA-C for follow-up. She was noted to have 2+pitting lower extremity edema as well as weight gain from 168 to 172lbs. He was also concerned for lower extremity cellulitis. However, the patient's son was hesitant to proceed with antibiotic therapy and wanted to check with her PCP first. She has been taking Lasix 80 mg dailyx 3 days, up from 40 mg daily previously. He instructed her to continue on current dose of Lasix for another week and planned to see her back in one to 2 weeks. However, the patient cancelled and rescheduled multiple times and ultimately was not seen back since that time.   She was admitted 8/28-8/31/17 for acute on chronic diastolic CHF. Lower extremity edema improved with IV lasix. She was diuresed 2.4L and 3 lbs (166--> 163.) Due to her frailty it was decided to not push  for more diuresis. She had some residual LE edema which was felt to be her baseline. Lasix was discontinue and she was transitioned to [facility] 40mg  daily at discharge. She did continue to have some lower extremity erythema but given lack of fever or leukocytosis and bilateral nature it was not felt to be cellulitis. Amlodipine was also discontinued.  She then represented to the [facility] ER last night with chest pain. Patient descibred sharp, pleuritic component of pain. CT PE study done in the ER was negative. BNP elevated to 691 and troponin borderline 0.04--> 0.04. CXR with no CHF or PNA but mitral valve annulus calcifications and thoracic aortic atherosclerosis. She has also had some abdominal pain and KUB is being ordered by IM.   She describes the chest pain as a severe 10 out of 10 chest pain that radiated into her throat. Prior to that she had had severe nausea and dry heaves but no vomiting. The chest pain lasted until she took 2 sublingual nitroglycerin which she had had in her purse for about 5 years. She continues to have residual chest pressure in her chest but nothing like before. She is not very active and walks with a walker. She thinks the chest pain is worse with exertion and better rest but isn't sure. She has chronic lower external knee edema which is relatively well controlled currently. She has chronic orthopnea and sleeps in an armchair. No PND. No dizziness or syncope.   Past Medical History:  Diagnosis Date  .  Anxiety   . Anxiety and depression   . Arthritis    "terribly in my hands" (01/13/2014)  . Bacterial pneumonia 01/13/2014  . Breast cancer ([place])   . Cervical disc disease    W radiculopathy S/P epidural injections  . Chronic bronchitis ([place])    "@ least once a year;  in the winter" (01/13/2014)  . Chronic diastolic CHF (congestive heart failure) ([facility])    Preserved EF  . Depression   . Exertional shortness of breath    "worse recently" (01/13/2014)  . Hypertension   .  Hypothyroidism   . Lower extremity edema    CHRONIC  . Macular degeneration    Dr Hillis Range Follows for pt.  . Moderate aortic stenosis 05/2012   by ECHO  . Pneumonia    "once before today" (01/13/2014)  . Vitamin D deficiency      Past Surgical History:  Procedure Laterality Date  . ABDOMINAL HYSTERECTOMY    . APPENDECTOMY  1949  . BREAST BIOPSY Right ~ 2005  . BREAST LUMPECTOMY Right ~ 2005  . CATARACT EXTRACTION W/ INTRAOCULAR LENS  IMPLANT, BILATERAL Bilateral ~ 2008  . TONSILLECTOMY  1949    Allergies  Allergen Reactions  . Penicillins Swelling    Lip swelling, Has patient had a PCN reaction causing immediate rash, facial/tongue/throat swelling, SOB or lightheadedness with hypotension: Yes Has patient had a PCN reaction causing severe rash involving mucus membranes or skin necrosis: No Has patient had a PCN reaction that required hospitalization No Has patient had a PCN reaction occurring within the last 10 years: No If all of the above answers are "NO", then may proceed with Cephalosporin use.   . Ciprofloxacin Nausea And Vomiting  . Codeine Swelling    Lip Swelling  . Metronidazole Itching    I have reviewed the patient's current medications . aspirin EC  81 mg Oral Q48H  . enoxaparin (LOVENOX) injection  30 mg Subcutaneous Daily  . escitalopram  10 mg Oral Daily  . famotidine  20 mg Oral BID  . levothyroxine  75 mcg Oral QAC breakfast  . multivitamin with minerals  1 tablet Oral QPC supper  . potassium chloride SA  20 mEq Oral Daily  . torsemide  40 mg Oral Q breakfast  . traMADol  100 mg Oral Q breakfast     acetaminophen, nitroGLYCERIN, ondansetron (ZOFRAN) IV, polyvinyl alcohol, torsemide  Prior to Admission medications   Medication Sig Start Date End Date Taking? Authorizing Provider  aspirin 81 MG EC tablet Take 1 tablet (81 mg total) by mouth every other day. Patient taking differently: Take 81 mg by mouth every other day. At night 06/28/15  Yes Sueanne Margarita, MD  Cholecalciferol 2000 units CAPS Take 2,000 Units by mouth every evening.   Yes Historical Provider, MD  clobetasol cream (TEMOVATE) [id number] % Apply 1 application topically daily.  07/17/16  Yes Historical Provider, MD  escitalopram (LEXAPRO) 10 MG tablet Take 10 mg by mouth every morning. 07/16/16  Yes Historical Provider, MD  levothyroxine (SYNTHROID, LEVOTHROID) 75 MCG tablet Take 1 tablet (75 mcg total) by mouth daily before breakfast. 12/28/15  Yes Lelon Perla, MD  Multiple Vitamin (MULTIVITAMIN WITH MINERALS) TABS Take 1 tablet by mouth daily after supper.    Yes Historical Provider, MD  Multiple Vitamins-Minerals (PRESERVISION AREDS 2 PO) Take 2 tablets by mouth every morning.    Yes Historical Provider, MD  nitroGLYCERIN (NITROSTAT) 0.4 MG SL tablet Place 1 tablet (0.4  mg total) under the tongue every 5 (five) minutes as needed for chest pain. 06/13/15  Yes Sueanne Margarita, MD  Polyethyl Glycol-Propyl Glycol (SYSTANE ULTRA) 0.4-0.3 % SOLN Apply 1 drop to eye 2 (two) times daily as needed.   Yes Historical Provider, MD  potassium chloride 20 MEQ TBCR Take 20 mEq by mouth daily. 07/12/16  Yes Shanker Kristeen Mans, MD  Probiotic Product (SOLUBLE FIBER/PROBIOTICS PO) Take 1 tablet by mouth daily.   Yes Historical Provider, MD  torsemide (DEMADEX) 20 MG tablet Take 2 tablets (40 mg total) by mouth daily. Patient taking differently: Take 40 mg by mouth 2 (two) times daily as needed (40 mg every morning and an additional 20 mg during the day as needed for fluid retention).  07/12/16  Yes Shanker Kristeen Mans, MD  traMADol (ULTRAM) 50 MG tablet Take 100 mg by mouth every morning. 07/20/16  Yes Historical Provider, MD  vitamin C (ASCORBIC ACID) 500 MG tablet Take 500 mg by mouth daily.   Yes Historical Provider, MD  acetaminophen (TYLENOL) 325 MG tablet Take 2 tablets (650 mg total) by mouth every 6 (six) hours as needed (pain or Fever >/= 101). Patient not taking: Reported on 07/31/2016 01/18/14   Modena Jansky, MD     Social History   Social History  . Marital status: Widowed    Spouse name: N/A  . Number of children: N/A  . Years of education: N/A   Occupational History  . Not on file.   Social History Main Topics  . Smoking status: Never Smoker  . Smokeless tobacco: Never Used  . Alcohol use No  . Drug use: No  . Sexual activity: No   Other Topics Concern  . Not on file   Social History Narrative  . No narrative on file    Family Status  Relation Status  . Mother Deceased   Natural causes  . Father Deceased  . Brother Deceased  . Maternal Grandmother Deceased  . Maternal Grandfather Deceased  . Paternal Grandmother Deceased  . Paternal Grandfather Deceased  . Brother Deceased   Family History  Problem Relation Age of Onset  . CVA Father   . Pneumonia Father   . Multiple sclerosis Brother   . CVA Brother     ROS:  Full 14 point review of systems complete and found to be negative unless listed above.  Physical Exam: Blood pressure 139/62, pulse (!) 53, temperature 98.3 F (36.8 C), temperature source Oral, resp. rate 18, weight 168 lb 12.8 oz (76.6 kg), SpO2 96 %.  General: Well developed, well nourished, female in no acute distress. Appears younger than stated age Head: Eyes PERRLA, No xanthomas.   Normocephalic and atraumatic, oropharynx without edema or exudate.   Lungs:  Heart: HRRR S1 S2, no rub/gallop, Heart regular rate and rhythm with S1, S2 + SEM @ RUSB. pulses are 2+ extrem.   Neck: No carotid bruits. No lymphadenopathy. No JVD. Abdomen: Bowel sounds present, abdomen soft and non-tender without masses or hernias noted. Msk:  No spine or cva tenderness. No weakness, no joint deformities or effusions. Extremities: No clubbing or cyanosis. 1+ LE edema. RLE wrapped Neuro: Alert and oriented X 3. No focal deficits noted. Psych:  Good affect, responds appropriately Skin: No rashes or lesions noted.  Labs:   Lab Results  Component Value Date    WBC 6.3 07/31/2016   HGB 9.7 (L) 07/31/2016   HCT 31.0 (L) 07/31/2016   MCV 96.6 07/31/2016  PLT 177 07/31/2016   No results for input(s): INR in the last 72 hours.  Recent Labs Lab 07/31/16 2021  NA 137  K 4.2  CL 100*  CO2 29  BUN 20  CREATININE 1.30*  CALCIUM 9.2  PROT 6.3*  BILITOT 0.8  ALKPHOS 82  ALT 15  AST 20  GLUCOSE 105*  ALBUMIN 3.4*   Magnesium  Date Value Ref Range Status  06/07/2015 3.1 (H) 1.7 - 2.4 mg/dL Final    Recent Labs  08/01/16 0431 08/01/16 0823  TROPONINI 0.04* 0.04*    Recent Labs  07/31/16 2205 08/01/16 0156  TROPIPOC 0.05 0.05   Pro B Natriuretic peptide (BNP)  Date/Time Value Ref Range Status  03/07/2015 12:40 PM 584.0 (H) 0.0 - 100.0 pg/mL Final  08/13/2014 10:06 AM 392.0 (H) 0.0 - 100.0 pg/mL Final   No results found for: CHOL, HDL, LDLCALC, TRIG Lab Results  Component Value Date   DDIMER 1.11 (H) 07/09/2016   No results found for: LIPASE, AMYLASE TSH  Date/Time Value Ref Range Status  03/07/2015 12:40 PM 1.60 0.35 - 4.50 uIU/mL Final    Echo: 07/11/2016 LV EF: 65% -   70% Study Conclusions - Left ventricle: The cavity size was normal. Wall thickness was   increased in a pattern of moderate LVH. Systolic function was   vigorous. The estimated ejection fraction was in the range of 65%   to 70%. Wall motion was normal; there were no regional wall   motion abnormalities. Doppler parameters are consistent with a   reversible restrictive pattern, indicative of decreased left   ventricular diastolic compliance and/or increased left atrial   pressure (grade 3 diastolic dysfunction). - Aortic valve: Moderately calcified annulus. Severely thickened,   moderately calcified leaflets. There was moderate stenosis. There   was moderate regurgitation. Valve area (VTI): 1.12 cm^2. Valve   area (Vmax): 0.99 cm^2. Valve area (Vmean): 1.1 cm^2. - Mitral valve: There was mild regurgitation. Valve area by   continuity equation  (using LVOT flow): 1.68 cm^2. - Left atrium: The atrium was severely dilated. - Right atrium: The atrium was mildly dilated. - Pulmonary arteries: Systolic pressure was moderately increased.   PA peak pressure: 53 mm Hg (S).  ECG:  Sinus with PVCs and PACs   Radiology:  Dg Chest 2 View  Result Date: 07/31/2016 CLINICAL DATA:  Chest pain EXAM: CHEST  2 VIEW COMPARISON:  07/09/2016 FINDINGS: Stable cardiomegaly without edema, CHF, or focal pneumonia. Mitral valve annulus calcifications noted. Thoracic aortic atherosclerosis present. Postop changes of the right breast and axilla. Degenerative changes of the spine. No significant interval change. IMPRESSION: Cardiomegaly without CHF or pneumonia Mitral valve annulus calcifications and thoracic aortic atherosclerosis Stable chest exam. Electronically Signed   By: Jerilynn Mages.  Shick M.D.   On: 07/31/2016 20:29   Ct Angio Chest Pe W And/or Wo Contrast  Result Date: 07/31/2016 CLINICAL DATA:  Central chest pain.  History of aortic stenosis. EXAM: CT ANGIOGRAPHY CHEST WITH CONTRAST TECHNIQUE: Multidetector CT imaging of the chest was performed using the standard protocol during bolus administration of intravenous contrast. Multiplanar CT image reconstructions and MIPs were obtained to evaluate the vascular anatomy. CONTRAST:  70 mL Isovue 370 COMPARISON:  None. FINDINGS: Cardiovascular: Satisfactory opacification of the pulmonary arteries to the segmental level. No evidence of pulmonary embolism. Diffuse cardiac enlargement. Calcification in the mitral valve annulus. Dilated ascending thoracic aorta measuring 4.5 cm maximal AP diameter. Scattered calcifications in the aorta. No pericardial effusion. Mediastinum/Nodes: No enlarged  mediastinal, hilar, or axillary lymph nodes. Thyroid gland, trachea, and esophagus demonstrate no significant findings. Surgical clips in the right axilla. Lungs/Pleura: Patchy mosaic attenuation pattern to the lungs could be due to motion  artifact, air trapping, or edema. No pleural effusions. No pneumothorax. Upper Abdomen: Reflux of contrast material into the hepatic veins suggests right heart failure. Vascular calcifications. Musculoskeletal: Diffuse degenerative changes throughout the thoracic spine. No destructive bone lesions. Review of the MIP images confirms the above findings. IMPRESSION: No evidence of significant pulmonary embolus. Cardiac enlargement with signs of right heart failure. 4.5 cm dilated ascending thoracic aorta. Aortic atherosclerosis. Mosaic attenuation pattern to the lungs could be due to motion artifact, edema, or air trapping. Electronically Signed   By: Lucienne Capers M.D.   On: 07/31/2016 23:34   Dg Hip Unilat With Pelvis 2-3 Views Right  Result Date: 08/01/2016 CLINICAL DATA:  Chronic right hip pain.  Initial encounter. EXAM: DG HIP (WITH OR WITHOUT PELVIS) 2-3V RIGHT COMPARISON:  None. FINDINGS: There is chronic superior joint space narrowing at the right hip. Mild axial joint space narrowing is noted at the left hip. There is diffuse sclerotic change at the right hip joint, with mild cortical irregularity. Mild sclerotic change is noted at the sacroiliac joints bilaterally. Scattered vascular calcifications are seen. IMPRESSION: 1. Chronic superior joint space narrowing at the right hip, with mild cortical irregularity and diffuse sclerotic change, reflecting osteoarthritis. 2. Mild axial joint space narrowing at the left hip. 3. Scattered vascular calcifications seen. Electronically Signed   By: Garald Balding M.D.   On: 08/01/2016 00:54    ASSESSMENT AND PLAN:    Principal Problem:   Chest pain  Chest pain: patient had very severe chest pain with radiation into her her throat associated with nausea and dry heaving. It was relieved after 2 sublingual nitroglycerin. She continues to have a very mild chest pressure. I don't think she is a good candidate for invasive workup due to frailty and advanced age.  Discussed cardiac catheterization with her and she is very clear that this is not something she would want anyway. She very well could have coronary artery disease. There was evidence of atherosclerosis in her aorta on chest x-ray. Her troponin is borderline elevated and flat with no diagnostic trend. ECG with no acute ST or T-wave changes. She has frequent ventricular ectopy and 3 beats of NSVT. No room for a beta blocker with sinus bradycardia. Will start her on Imdur 30 and see if this helps the residual chest pressure. Otherwise final plan per per Dr. Claiborne Billings who will follow me. -- Continue aspirin  Chronic diastolic heart failure: She appears relatively euvolemic at this time. Would continue torsemide 40 mg daily  Moderate AS: Doubt this is contributing to her chest pain.   Hypertension: Stable on only torsemide. Will add Imdur as above  Hypothyroidism: On Synthroid  Signed: Angelena Form, PA-C 08/01/2016 12:56 PM  Pager [phone]  Co-Sign MD  Patient seen and examined. Agree with assessment and plan.  Ms Sinibaldi is a very pleasant 82 year old female who has a history of hypothyroidism, hypertension, moderate aortic valve stenosis and diastolic heart failure.  She is followed by Dr. Stanford Hoover.  Her last echo Doppler study .  3 weeks ago showed an EF of 65-70% with moderately severe aortic stenosis with a valve area of ~1.0 cm2.  She developed chest pain which was not exertionally precipitated.  Her chest pain ultimately resolved following sublingual nitroglycerin and she continues to be  pain-free presently.  Physical exam is noteworthy for slightly delayed carotid upstroke.  She did not have rales on exam.  There was at least 2-3/6 mid peaking aortic stenosis murmur.  There is no S3 gallop or diastolic murmur.  She has had issues with lower extremity edema.  Her d-dimer was mildly positive at 1.11.  A CT scan today was negative for pulmonary embolus. There was a suggestion of cardiac  enlargement with signs of right heart failure.  Her ascending thoracic aorta was mildly dilated at 4.5 cm and there was evidence for aortic atherosclerosis.  BNP is elevated at 691 and is consistent with probable acute on chronic diastolic heart failure.  Troponin is minimally elevated and I do not believe reflective of acute coronary syndrome but rather possible demand ischemia.  With the patient's age of 67 years, a conservative approach is warranted.  We will see in a.m.   Troy Sine, MD, [facility] 08/01/2016 7:42 PM

## 2016-08-01 NOTE — ED Notes (Signed)
Pt's family request blood draw from IV,  Will notify nurse.

## 2016-08-01 NOTE — ED Notes (Signed)
Informed MD Fabio Neighbors of I stat Trop level

## 2016-08-01 NOTE — ED Notes (Signed)
Attempted to call report

## 2016-08-02 DIAGNOSIS — R0789 Other chest pain: Secondary | ICD-10-CM | POA: Diagnosis not present

## 2016-08-02 DIAGNOSIS — R079 Chest pain, unspecified: Secondary | ICD-10-CM | POA: Diagnosis not present

## 2016-08-02 DIAGNOSIS — R627 Adult failure to thrive: Secondary | ICD-10-CM | POA: Diagnosis not present

## 2016-08-02 DIAGNOSIS — R600 Localized edema: Secondary | ICD-10-CM | POA: Diagnosis not present

## 2016-08-02 DIAGNOSIS — I350 Nonrheumatic aortic (valve) stenosis: Secondary | ICD-10-CM | POA: Diagnosis not present

## 2016-08-02 LAB — CBC
HEMATOCRIT: 30 % — AB (ref 36.0–46.0)
HEMOGLOBIN: 9.2 g/dL — AB (ref 12.0–15.0)
MCH: 30.3 pg (ref 26.0–34.0)
MCHC: 30.7 g/dL (ref 30.0–36.0)
MCV: 98.7 fL (ref 78.0–100.0)
Platelets: 157 10*3/uL (ref 150–400)
RBC: 3.04 MIL/uL — ABNORMAL LOW (ref 3.87–5.11)
RDW: 15.4 % (ref 11.5–15.5)
WBC: 8.5 10*3/uL (ref 4.0–10.5)

## 2016-08-02 LAB — COMPREHENSIVE METABOLIC PANEL WITH GFR
ALT: 13 U/L — ABNORMAL LOW (ref 14–54)
AST: 20 U/L (ref 15–41)
Albumin: 3.3 g/dL — ABNORMAL LOW (ref 3.5–5.0)
Alkaline Phosphatase: 81 U/L (ref 38–126)
Anion gap: 12 (ref 5–15)
BUN: 17 mg/dL (ref 6–20)
CO2: 27 mmol/L (ref 22–32)
Calcium: 8.9 mg/dL (ref 8.9–10.3)
Chloride: 98 mmol/L — ABNORMAL LOW (ref 101–111)
Creatinine, Ser: 1.23 mg/dL — ABNORMAL HIGH (ref 0.44–1.00)
GFR calc Af Amer: 41 mL/min — ABNORMAL LOW
GFR calc non Af Amer: 35 mL/min — ABNORMAL LOW
Glucose, Bld: 151 mg/dL — ABNORMAL HIGH (ref 65–99)
Potassium: 4.2 mmol/L (ref 3.5–5.1)
Sodium: 137 mmol/L (ref 135–145)
Total Bilirubin: 1 mg/dL (ref 0.3–1.2)
Total Protein: 6.5 g/dL (ref 6.5–8.1)

## 2016-08-02 LAB — COMPREHENSIVE METABOLIC PANEL
ALBUMIN: 3.2 g/dL — AB (ref 3.5–5.0)
ALK PHOS: 74 U/L (ref 38–126)
ALT: 13 U/L — ABNORMAL LOW (ref 14–54)
ANION GAP: 10 (ref 5–15)
AST: 38 U/L (ref 15–41)
BUN: 17 mg/dL (ref 6–20)
CALCIUM: 8.8 mg/dL — AB (ref 8.9–10.3)
CO2: 28 mmol/L (ref 22–32)
CREATININE: 1.23 mg/dL — AB (ref 0.44–1.00)
Chloride: 99 mmol/L — ABNORMAL LOW (ref 101–111)
GFR, EST AFRICAN AMERICAN: 41 mL/min — AB (ref >60)
GFR, EST NON AFRICAN AMERICAN: 35 mL/min — AB (ref >60)
Glucose, Bld: 110 mg/dL — ABNORMAL HIGH (ref 65–99)
Potassium: 5.7 mmol/L — ABNORMAL HIGH (ref 3.5–5.1)
Sodium: 137 mmol/L (ref 135–145)
TOTAL PROTEIN: 6.1 g/dL — AB (ref 6.5–8.1)
Total Bilirubin: 1.7 mg/dL — ABNORMAL HIGH (ref 0.3–1.2)

## 2016-08-02 LAB — TSH: TSH: 2.381 u[IU]/mL (ref 0.350–4.500)

## 2016-08-02 MED ORDER — ACETAMINOPHEN 500 MG PO TABS: 500.0000 mg | ORAL_TABLET | Freq: Three times a day (TID) | ORAL | 0 refills | Status: AC | PRN

## 2016-08-02 MED ORDER — ACETAMINOPHEN 500 MG PO TABS
500.0000 mg | ORAL_TABLET | Freq: Every day | ORAL | 0 refills | Status: AC
Start: 2016-08-02 — End: ?

## 2016-08-02 MED ORDER — FAMOTIDINE 20 MG PO TABS: 20.0000 mg | ORAL_TABLET | Freq: Every day | ORAL | 0 refills | Status: AC

## 2016-08-02 MED ORDER — FAMOTIDINE 20 MG PO TABS: 20.0000 mg | ORAL_TABLET | Freq: Every day | ORAL | Status: DC

## 2016-08-02 MED ORDER — ISOSORBIDE MONONITRATE ER 30 MG PO TB24: 30.0000 mg | ORAL_TABLET | Freq: Every day | ORAL | 0 refills | Status: AC

## 2016-08-02 NOTE — Discharge Summary (Signed)
Discharge Summary  Kathy Hoover [id number] DOB: [birth year]  PCP: Anthoney Harada, MD  Admit date: 07/31/2016 Discharge date: 08/02/2016  Time spent: <37mins  Recommendations for Outpatient Follow-up:  1. F/u with PMD within a week  for hospital discharge follow up, repeat cbc/bmp at follow up 2. F/u with cardiology, monitor effect of imdur, patient is started on imdur by cardiology recommendation.  Discharge Diagnoses:  Active Hospital Problems   Diagnosis Date Noted  . Pain in the chest 08/01/2016  . Constipation   . FTT (failure to thrive) in adult     Resolved Hospital Problems   Diagnosis Date Noted Date Resolved  No resolved problems to display.    Discharge Condition: stable  Diet recommendation: heart healthy/carb modified  Filed Weights   08/01/16 0254  Weight: 76.6 kg (168 lb 12.8 oz)    History of present illness:   Kathy Hoover is a 82 y.o. female with medical history significant of Diastolic CHF grade 3, moderate AS, mild MR.  Patient presents to ED after developing severe central chest pain with radiation to throat at 530pm.  Relieved with 324 ASA and 1 SL NTG.  Pain free at this time.  ED Course: Remains pain free in ED, trop neg.  Hospital Course:  Principal Problem:   Pain in the chest Active Problems:   Constipation   FTT (failure to thrive) in adult   Chest pain,  With mild flat troponin, ekg interval changes vs leads placement in anterioseptal leads, cardiology consulted who recommended imdur, she is to follow up with cardiology  Chest pain could also from gi issues, patient did endorse dry heaving yesterday,  DG esophagus Mild decrease in esophageal motility, she is discharged on pepcid. Chest pain free at discharge.  Lower extremity edema: h/o diastolic chf, continue demadex  Cr elevation, cr 1.3-1.23-1.23, close to baseline  Left lower abdominal pain with intermittent constipation and diarrhea, lft wnl, no fever, no  leukocytosis, kub no acute findings, no pain at discharge  Hypothyroidism: continue home meds, tsh 2.3  Arthritic pain, schedule tylenol with tramadol daily am, with prn tylenol. Continue PT/OT.  FTT: I have discussed with family, they prefer patient go back to independent living for now,  home health arranged  Code Status: DNR  Family Communication: patient and her two sons james in the room  Disposition Plan: d/c home with home health with cardiology clearance   Consultants:  cardiology  Procedures:  none  Antibiotics:  none   Discharge Exam: BP 125/61 (BP Location: Left Arm)   Pulse 64   Temp 97.8 F (36.6 C) (Oral)   Resp 18   Wt 76.6 kg (168 lb 12.8 oz)   SpO2 94%   BMI 34.09 kg/m   General: frail elderly, NAD, very pleasant Cardiovascular: RRR, + murmur Respiratory: CTABL  Discharge Instructions You were cared for by a hospitalist during your hospital stay. If you have any questions about your discharge medications or the care you received while you were in the hospital after you are discharged, you can call the unit and asked to speak with the hospitalist on call if the hospitalist that took care of you is not available. Once you are discharged, your primary care physician will handle any further medical issues. Please note that NO REFILLS for any discharge medications will be authorized once you are discharged, as it is imperative that you return to your primary care physician (or establish a relationship with a primary care physician if you do not  have one) for your aftercare needs so that they can reassess your need for medications and monitor your lab values.  Discharge Instructions    Diet - low sodium heart healthy    Complete by:  As directed    Face-to-face encounter (required for Medicare/Medicaid patients)    Complete by:  As directed    I Cipriano Millikan certify that this patient is under my care and that I, or a nurse practitioner or physician's  assistant working with me, had a face-to-face encounter that meets the physician face-to-face encounter requirements with this patient on 08/02/2016. The encounter with the patient was in whole, or in part for the following medical condition(s) which is the primary reason for home health care (List medical condition): FTT   The encounter with the patient was in whole, or in part, for the following medical condition, which is the primary reason for home health care:  FTT   I certify that, based on my findings, the following services are medically necessary home health services:   Nursing Physical therapy     Reason for Medically Necessary Home Health Services:  Skilled Nursing- Change/Decline in Patient Status   My clinical findings support the need for the above services:  Pain interferes with ambulation/mobility   Further, I certify that my clinical findings support that this patient is homebound due to:  Unable to leave home safely without assistance   Home Health    Complete by:  As directed    To provide the following care/treatments:   PT OT RN Home Health Aide     Increase activity slowly    Complete by:  As directed        Medication List    TAKE these medications   acetaminophen 500 MG tablet Commonly known as:  TYLENOL Take 1 tablet (500 mg total) by mouth daily. What changed:  medication strength  how much to take  when to take this  reasons to take this   acetaminophen 500 MG tablet Commonly known as:  TYLENOL Take 1 tablet (500 mg total) by mouth every 8 (eight) hours as needed. What changed:  You were already taking a medication with the same name, and this prescription was added. Make sure you understand how and when to take each.   aspirin 81 MG EC tablet Take 1 tablet (81 mg total) by mouth every other day. What changed:  additional instructions   Cholecalciferol 2000 units Caps Take 2,000 Units by mouth every evening.   clobetasol cream 0.05 % Commonly  known as:  TEMOVATE Apply 1 application topically daily.   escitalopram 10 MG tablet Commonly known as:  LEXAPRO Take 10 mg by mouth every morning.   famotidine 20 MG tablet Commonly known as:  PEPCID Take 1 tablet (20 mg total) by mouth daily. Start taking on:  08/03/2016   isosorbide mononitrate 30 MG 24 hr tablet Commonly known as:  IMDUR Take 1 tablet (30 mg total) by mouth daily. Start taking on:  08/03/2016   levothyroxine 75 MCG tablet Commonly known as:  SYNTHROID, LEVOTHROID Take 1 tablet (75 mcg total) by mouth daily before breakfast.   multivitamin with minerals Tabs tablet Take 1 tablet by mouth daily after supper.   nitroGLYCERIN 0.4 MG SL tablet Commonly known as:  NITROSTAT Place 1 tablet (0.4 mg total) under the tongue every 5 (five) minutes as needed for chest pain.   Potassium Chloride ER 20 MEQ Tbcr Take 20 mEq by mouth daily.  PRESERVISION AREDS 2 PO Take 2 tablets by mouth every morning.   SOLUBLE FIBER/PROBIOTICS PO Take 1 tablet by mouth daily.   SYSTANE ULTRA 0.4-0.3 % Soln Generic drug:  Polyethyl Glycol-Propyl Glycol Apply 1 drop to eye 2 (two) times daily as needed.   torsemide 20 MG tablet Commonly known as:  DEMADEX Take 2 tablets (40 mg total) by mouth daily. What changed:  when to take this  reasons to take this   traMADol 50 MG tablet Commonly known as:  ULTRAM Take 100 mg by mouth every morning.   vitamin C 500 MG tablet Commonly known as:  ASCORBIC ACID Take 500 mg by mouth daily.      Allergies  Allergen Reactions  . Penicillins Swelling    Lip swelling, Has patient had a PCN reaction causing immediate rash, facial/tongue/throat swelling, SOB or lightheadedness with hypotension: Yes Has patient had a PCN reaction causing severe rash involving mucus membranes or skin necrosis: No Has patient had a PCN reaction that required hospitalization No Has patient had a PCN reaction occurring within the last 10 years: No If all  of the above answers are "NO", then may proceed with Cephalosporin use.   . Ciprofloxacin Nausea And Vomiting  . Codeine Swelling    Lip Swelling  . Metronidazole Itching   Follow-up Information    WONG,FRANCIS PATRICK, MD Follow up in 1 week(s).   Specialty:  Family Medicine Why:  hospital discharge follow up Contact information: [place] [address] [phone]        Kirk Ruths, MD Follow up in 2 week(s).   Specialty:  Cardiology Why:  hospital discharge follow up, to monitor effect of imdur, please bring in your blood pressure record to your heart doctor. Contact information: [address] [place] [address] [phone]            The results of significant diagnostics from this hospitalization (including imaging, microbiology, ancillary and laboratory) are listed below for reference.    Significant Diagnostic Studies: Dg Chest 2 View  Result Date: 07/31/2016 CLINICAL DATA:  Chest pain EXAM: CHEST  2 VIEW COMPARISON:  07/09/2016 FINDINGS: Stable cardiomegaly without edema, CHF, or focal pneumonia. Mitral valve annulus calcifications noted. Thoracic aortic atherosclerosis present. Postop changes of the right breast and axilla. Degenerative changes of the spine. No significant interval change. IMPRESSION: Cardiomegaly without CHF or pneumonia Mitral valve annulus calcifications and thoracic aortic atherosclerosis Stable chest exam. Electronically Signed   By: Jerilynn Mages.  Shick M.D.   On: 07/31/2016 20:29   Dg Chest 2 View  Result Date: 07/09/2016 CLINICAL DATA:  Bilateral lower extremity swelling. EXAM: CHEST  2 VIEW COMPARISON:  Radiographs of December 03, 2015. FINDINGS: Stable cardiomegaly. Right axillary surgical clips are noted. No pneumothorax or pleural effusion is noted. No acute pulmonary disease is noted. Bony thorax is unremarkable. IMPRESSION: No active cardiopulmonary disease. Electronically Signed   By: Marijo Conception, M.D.    On: 07/09/2016 16:10   Dg Abd 1 View  Result Date: 08/01/2016 CLINICAL DATA:  Abdominal pain and constipation EXAM: ABDOMEN - 1 VIEW COMPARISON:  12/03/2015 abdominal CT FINDINGS: Mild stool volume without obstruction or rectal impaction. Benign coarse calcification over the right flank is likely mesenteric granuloma. Rounded soft tissue density over the left flank is likely rotated renal shadow based on 12/03/2015 abdominal CT. Severe right hip osteoarthritis with bone-on-bone contact and subchondral sclerosis. Severe lumbar disc degeneration with levoscoliosis. Remote right eleventh rib fracture. IMPRESSION:  Negative for obstruction or abnormal stool retention. Electronically Signed   By: Monte Fantasia M.D.   On: 08/01/2016 15:07   Ct Angio Chest Pe W And/or Wo Contrast  Result Date: 07/31/2016 CLINICAL DATA:  Central chest pain.  History of aortic stenosis. EXAM: CT ANGIOGRAPHY CHEST WITH CONTRAST TECHNIQUE: Multidetector CT imaging of the chest was performed using the standard protocol during bolus administration of intravenous contrast. Multiplanar CT image reconstructions and MIPs were obtained to evaluate the vascular anatomy. CONTRAST:  70 mL Isovue 370 COMPARISON:  None. FINDINGS: Cardiovascular: Satisfactory opacification of the pulmonary arteries to the segmental level. No evidence of pulmonary embolism. Diffuse cardiac enlargement. Calcification in the mitral valve annulus. Dilated ascending thoracic aorta measuring 4.5 cm maximal AP diameter. Scattered calcifications in the aorta. No pericardial effusion. Mediastinum/Nodes: No enlarged mediastinal, hilar, or axillary lymph nodes. Thyroid gland, trachea, and esophagus demonstrate no significant findings. Surgical clips in the right axilla. Lungs/Pleura: Patchy mosaic attenuation pattern to the lungs could be due to motion artifact, air trapping, or edema. No pleural effusions. No pneumothorax. Upper Abdomen: Reflux of contrast material into the  hepatic veins suggests right heart failure. Vascular calcifications. Musculoskeletal: Diffuse degenerative changes throughout the thoracic spine. No destructive bone lesions. Review of the MIP images confirms the above findings. IMPRESSION: No evidence of significant pulmonary embolus. Cardiac enlargement with signs of right heart failure. 4.5 cm dilated ascending thoracic aorta. Aortic atherosclerosis. Mosaic attenuation pattern to the lungs could be due to motion artifact, edema, or air trapping. Electronically Signed   By: Lucienne Capers M.D.   On: 07/31/2016 23:34   Dg Esophagus  Result Date: 08/01/2016 CLINICAL DATA:  Chest pain EXAM: ESOPHOGRAM/BARIUM SWALLOW TECHNIQUE: Single contrast examination was performed using  Omnipaque 300. FLUOROSCOPY TIME:  Fluoroscopy Time:  0 minutes 48 seconds Radiation Exposure Index (if provided by the fluoroscopic device): Number of Acquired Spot Images: 0 COMPARISON:  CT chest 07/31/2016 FINDINGS: Study is performed in the supine position. Mild decrease in esophageal motility. No mucosal edema. No ulcer or mass. Negative for stricture. Contrast flows freely into the stomach. IMPRESSION: Negative Electronically Signed   By: Franchot Gallo M.D.   On: 08/01/2016 14:55   Dg Hip Unilat With Pelvis 2-3 Views Right  Result Date: 08/01/2016 CLINICAL DATA:  Chronic right hip pain.  Initial encounter. EXAM: DG HIP (WITH OR WITHOUT PELVIS) 2-3V RIGHT COMPARISON:  None. FINDINGS: There is chronic superior joint space narrowing at the right hip. Mild axial joint space narrowing is noted at the left hip. There is diffuse sclerotic change at the right hip joint, with mild cortical irregularity. Mild sclerotic change is noted at the sacroiliac joints bilaterally. Scattered vascular calcifications are seen. IMPRESSION: 1. Chronic superior joint space narrowing at the right hip, with mild cortical irregularity and diffuse sclerotic change, reflecting osteoarthritis. 2. Mild axial  joint space narrowing at the left hip. 3. Scattered vascular calcifications seen. Electronically Signed   By: Garald Balding M.D.   On: 08/01/2016 00:54    Microbiology: Recent Results (from the past 240 hour(s))  MRSA PCR Screening     Status: None   Collection Time: 08/01/16  3:17 AM  Result Value Ref Range Status   MRSA by PCR NEGATIVE NEGATIVE Final    Comment:        The GeneXpert MRSA Assay (FDA approved for NASAL specimens only), is one component of a comprehensive MRSA colonization surveillance program. It is not intended to diagnose MRSA infection nor to guide  or monitor treatment for MRSA infections.      Labs: Basic Metabolic Panel:  Recent Labs Lab 07/31/16 2021 08/02/16 0251 08/02/16 0907  NA 137 137 137  K 4.2 5.7* 4.2  CL 100* 99* 98*  CO2 29 28 27   GLUCOSE 105* 110* 151*  BUN 20 17 17   CREATININE 1.30* 1.23* 1.23*  CALCIUM 9.2 8.8* 8.9   Liver Function Tests:  Recent Labs Lab 07/31/16 2021 08/02/16 0251 08/02/16 0907  AST 20 38 20  ALT 15 13* 13*  ALKPHOS 82 74 81  BILITOT 0.8 1.7* 1.0  PROT 6.3* 6.1* 6.5  ALBUMIN 3.4* 3.2* 3.3*   No results for input(s): LIPASE, AMYLASE in the last 168 hours. No results for input(s): AMMONIA in the last 168 hours. CBC:  Recent Labs Lab 07/31/16 2021 08/02/16 0251  WBC 6.3 8.5  NEUTROABS 3.7  --   HGB 9.7* 9.2*  HCT 31.0* 30.0*  MCV 96.6 98.7  PLT 177 157   Cardiac Enzymes:  Recent Labs Lab 08/01/16 0431 08/01/16 0823  TROPONINI 0.04* 0.04*   BNP: BNP (last 3 results)  Recent Labs  12/03/15 1724 07/09/16 1631 07/31/16 1914  BNP 311.6* 413.8* 691.2*    ProBNP (last 3 results) No results for input(s): PROBNP in the last 8760 hours.  CBG: No results for input(s): GLUCAP in the last 168 hours.     SignedFlorencia Reasons MD, PhD  [facility] 08/02/2016, 12:36 PM

## 2016-08-02 NOTE — Progress Notes (Signed)
PT Cancellation Note  Patient Details Name: Kathy Hoover MRN: [id number] DOB: Oct 03, 1916   Cancelled Treatment:    Reason Eval/Treat Not Completed: Medical issues which prohibited therapy (pt with current K of 5.7 not yet initiated with correction)   Lanetta Inch Beth 08/02/2016, 7:15 AM Elwyn Reach, [place]

## 2016-08-02 NOTE — Care Management Note (Signed)
Case Management Note [name] RN, BSN Unit 2W-Case Manager [phone]  Patient Details  Name: Kathy Hoover MRN: [id number] Date of Birth: 1916/08/27  Subjective/Objective:   Pt admitted C/P                 Action/Plan: PTA pt lived at [facility]- per conversation with pt and her sons - pt has a Teaching laboratory technician- Deneise Lever that assist her to meals and in the AM/PM to get her compression stockings on- and she does PT/OT with inhouse provider there- [facility]- pt's PCP has made a referral to [facility]- which is now following pt and also had made a referral to [facility]- but pt wanted to stay with [facility] at the time- and [facility] and [facility] both could not provide services at the same time. Pt also has home 02.   Expected Discharge Date:     08/02/16             Expected Discharge Plan:  [place]  In-House Referral:     Discharge planning Services  CM Consult  Post Acute Care Choice:  Home Health Choice offered to:  Patient, Adult Children  DME Arranged:    DME Agency:     HH Arranged:  RN, Disease Management, PT, Nurse's Aide, OT HH Agency:  [facility]  Status of Service:  Completed, signed off  If discussed at [place] of Stay Meetings, dates discussed:    Discharge Disposition: Home with Home Health   Additional Comments:  SonLorelai [id number], home-[phone]  pts' home #- [phone]  08/02/16- 8- Hilja Kintzel RN, CM- pt for [place]c back to [place] today- orders for HHRN/PT/OT/aide- spoke with pt and her son Clair Gulling at bedside- to discuss and offer choice for [facility]- as pt is active with [facility] an [facility] house provider [facility] - discussed options with pt and son on whether pt wanted to stay with [facility] or go with another [facility] provider such as [facility]- that could also provide the RN services- per pt and son they would like to use [facility] at this time so that pt can have all services needed that include  RN/PT/OT/aide- have made call to [facility] spoke with [facility] to inform them that pt would be returning under [facility] services with [facility] and would not be with [facility] at this time- referral for [facility] services call to Abby with [facility] per pt choice for [facility] services.   Dawayne Patricia, RN 08/02/2016, 2:54 PM

## 2016-08-02 NOTE — Progress Notes (Signed)
Patient Name: Kathy Hoover Date of Encounter: 08/02/2016  Principal Problem:   Pain in the chest Active Problems:   Constipation   FTT (failure to thrive) in adult   Primary Cardiologist: Dr. Stanford Breed Patient Profile: 82 year old female with a past medical history of hypothyroidism, HTN, moderate AS and chronic diastolic heart failure who presented to [facility] on 08/01/16 with chest pain. Troponin flat at 0.04.    SUBJECTIVE: Feels well, denies chest pain and SOB.   OBJECTIVE Vitals:   08/01/16 0805 08/01/16 1300 08/01/16 1925 08/02/16 0515  BP: 139/62 140/61 (!) 115/54 125/61  Pulse: (!) 53 (!) 51 69 64  Resp: 18 18 18 18   Temp: 98.3 F (36.8 C) 97.9 F (36.6 C) 98.3 F (36.8 C) 97.8 F (36.6 C)  TempSrc: Oral Oral Oral Oral  SpO2: 96% 96% 93% 94%  Weight:        Intake/Output Summary (Last 24 hours) at 08/02/16 [id number] Last data filed at 08/01/16 1230  Gross per 24 hour  Intake                0 ml  Output                0 ml  Net                0 ml   Filed Weights   08/01/16 0254  Weight: 168 lb 12.8 oz (76.6 kg)    PHYSICAL EXAM General: Well developed, well nourished, elderly female in no acute distress. Head: Normocephalic, atraumatic.  Neck: Supple without bruits, no JVD. Lungs:  Resp regular and unlabored, CTA. Heart: RRR, S1, S2, no S3, S4 2/6 systolic murmur heard at left sternal border; no rub. Abdomen: Soft, non-tender, non-distended, BS + x 4.  Extremities: No clubbing, cyanosis, LLE edema, una boot on. RLE no pitting edema. Neuro: Alert and oriented X 3. Moves all extremities spontaneously. Psych: Normal affect.  LABS: CBC: Recent Labs  07/31/16 2021 08/02/16 0251  WBC 6.3 8.5  NEUTROABS 3.7  --   HGB 9.7* 9.2*  HCT 31.0* 30.0*  MCV 96.6 98.7  PLT 177 [id number]   Basic Metabolic Panel: Recent Labs  07/31/16 2021 08/02/16 0251  NA 137 137  K 4.2 5.7*  CL 100* 99*  CO2 29 28  GLUCOSE 105* 110*  BUN 20 17  CREATININE 1.30* 1.23*  CALCIUM  9.2 8.8*   Liver Function Tests: Recent Labs  07/31/16 2021 08/02/16 0251  AST 20 38  ALT 15 13*  ALKPHOS 82 74  BILITOT 0.8 1.7*  PROT 6.3* 6.1*  ALBUMIN 3.4* 3.2*   Cardiac Enzymes: Recent Labs  08/01/16 0431 08/01/16 0823  TROPONINI 0.04* 0.04*    Recent Labs  07/31/16 2205 08/01/16 0156  TROPIPOC 0.05 0.05   BNP:  B Natriuretic Peptide  Date/Time Value Ref Range Status  07/31/2016 07:14 PM 691.2 (H) 0.0 - 100.0 pg/mL Final  07/09/2016 04:31 PM 413.8 (H) 0.0 - 100.0 pg/mL Final   Thyroid Function Tests: Recent Labs  08/02/16 0251  TSH 2.381    Current Facility-Administered Medications:  .  acetaminophen (TYLENOL) tablet 650 mg, 650 mg, Oral, Q6H PRN, Etta Quill, DO .  aspirin EC tablet 81 mg, 81 mg, Oral, Q48H, Etta Quill, DO, 81 mg at 08/01/16 2146 .  enoxaparin (LOVENOX) injection 30 mg, 30 mg, Subcutaneous, Daily, Etta Quill, DO, 30 mg at 08/01/16 1126 .  escitalopram (LEXAPRO) tablet 10 mg, 10  mg, Oral, Daily, Etta Quill, DO, 10 mg at 08/01/16 1127 .  famotidine (PEPCID) tablet 20 mg, 20 mg, Oral, BID, Florencia Reasons, MD, 20 mg at 08/01/16 2146 .  Influenza vac split quadrivalent PF (FLUARIX) injection 0.5 mL, 0.5 mL, Intramuscular, Tomorrow-1000, Florencia Reasons, MD .  isosorbide mononitrate (IMDUR) 24 hr tablet 30 mg, 30 mg, Oral, Daily, Eileen Stanford, PA-C, 30 mg at 08/01/16 1447 .  levothyroxine (SYNTHROID, LEVOTHROID) tablet 75 mcg, 75 mcg, Oral, QAC breakfast, Etta Quill, DO, 75 mcg at 08/02/16 0631 .  multivitamin with minerals tablet 1 tablet, 1 tablet, Oral, QPC supper, Etta Quill, DO, 1 tablet at 08/01/16 1742 .  nitroGLYCERIN (NITROSTAT) SL tablet 0.4 mg, 0.4 mg, Sublingual, Q5 min PRN, Gareth Morgan, MD .  ondansetron (ZOFRAN) injection 4 mg, 4 mg, Intravenous, Q6H PRN, Etta Quill, DO .  polyvinyl alcohol (LIQUIFILM TEARS) 1.4 % ophthalmic solution 1 drop, 1 drop, Both Eyes, BID PRN, Etta Quill, DO .  potassium  chloride SA (K-DUR,KLOR-CON) CR tablet 20 mEq, 20 mEq, Oral, Daily, Etta Quill, DO, 20 mEq at 08/01/16 1127 .  torsemide (DEMADEX) tablet 20 mg, 20 mg, Oral, Daily PRN, Etta Quill, DO .  torsemide [facility]) tablet 40 mg, 40 mg, Oral, Q breakfast, Jared M Gardner, DO, 40 mg at 08/01/16 1126 .  traMADol (ULTRAM) tablet 100 mg, 100 mg, Oral, Q breakfast, Etta Quill, DO, 100 mg at 08/02/16 0631    TELE:   NSR     ECG: NSR, T wave inversion in V1-V2  Echo: 07/11/2016 LV EF: 65% - 70% Study Conclusions - Left ventricle: The cavity size was normal. Wall thickness was increased in a pattern of moderate LVH. Systolic function was vigorous. The estimated ejection fraction was in the range of 65% to 70%. Wall motion was normal; there were no regional wall motion abnormalities. Doppler parameters are consistent with a reversible restrictive pattern, indicative of decreased left ventricular diastolic compliance and/or increased left atrial pressure (grade 3 diastolic dysfunction). - Aortic valve: Moderately calcified annulus. Severely thickened, moderately calcified leaflets. There was moderate stenosis. There was moderate regurgitation. Valve area (VTI): 1.12 cm^2. Valve area (Vmax): 0.99 cm^2. Valve area (Vmean): 1.1 cm^2. - Mitral valve: There was mild regurgitation. Valve area by continuity equation (using LVOT flow): 1.68 cm^2. - Left atrium: The atrium was severely dilated. - Right atrium: The atrium was mildly dilated. - Pulmonary arteries: Systolic pressure was moderately increased. PA peak pressure: 53 mm Hg (S).    Radiology/Studies: Dg Chest 2 View  Result Date: 07/31/2016 CLINICAL DATA:  Chest pain EXAM: CHEST  2 VIEW COMPARISON:  07/09/2016 FINDINGS: Stable cardiomegaly without edema, CHF, or focal pneumonia. Mitral valve annulus calcifications noted. Thoracic aortic atherosclerosis present. Postop changes of the right breast and axilla.  Degenerative changes of the spine. No significant interval change. IMPRESSION: Cardiomegaly without CHF or pneumonia Mitral valve annulus calcifications and thoracic aortic atherosclerosis Stable chest exam. Electronically Signed   By: Jerilynn Mages.  Shick M.D.   On: 07/31/2016 20:29   Dg Abd 1 View  Result Date: 08/01/2016 CLINICAL DATA:  Abdominal pain and constipation EXAM: ABDOMEN - 1 VIEW COMPARISON:  12/03/2015 abdominal CT FINDINGS: Mild stool volume without obstruction or rectal impaction. Benign coarse calcification over the right flank is likely mesenteric granuloma. Rounded soft tissue density over the left flank is likely rotated renal shadow based on 12/03/2015 abdominal CT. Severe right hip osteoarthritis with bone-on-bone contact and subchondral sclerosis.  Severe lumbar disc degeneration with levoscoliosis. Remote right eleventh rib fracture. IMPRESSION: Negative for obstruction or abnormal stool retention. Electronically Signed   By: Monte Fantasia M.D.   On: 08/01/2016 15:07   Ct Angio Chest Pe W And/or Wo Contrast  Result Date: 07/31/2016 CLINICAL DATA:  Central chest pain.  History of aortic stenosis. EXAM: CT ANGIOGRAPHY CHEST WITH CONTRAST TECHNIQUE: Multidetector CT imaging of the chest was performed using the standard protocol during bolus administration of intravenous contrast. Multiplanar CT image reconstructions and MIPs were obtained to evaluate the vascular anatomy. CONTRAST:  70 mL Isovue 370 COMPARISON:  None. FINDINGS: Cardiovascular: Satisfactory opacification of the pulmonary arteries to the segmental level. No evidence of pulmonary embolism. Diffuse cardiac enlargement. Calcification in the mitral valve annulus. Dilated ascending thoracic aorta measuring 4.5 cm maximal AP diameter. Scattered calcifications in the aorta. No pericardial effusion. Mediastinum/Nodes: No enlarged mediastinal, hilar, or axillary lymph nodes. Thyroid gland, trachea, and esophagus demonstrate no significant  findings. Surgical clips in the right axilla. Lungs/Pleura: Patchy mosaic attenuation pattern to the lungs could be due to motion artifact, air trapping, or edema. No pleural effusions. No pneumothorax. Upper Abdomen: Reflux of contrast material into the hepatic veins suggests right heart failure. Vascular calcifications. Musculoskeletal: Diffuse degenerative changes throughout the thoracic spine. No destructive bone lesions. Review of the MIP images confirms the above findings. IMPRESSION: No evidence of significant pulmonary embolus. Cardiac enlargement with signs of right heart failure. 4.5 cm dilated ascending thoracic aorta. Aortic atherosclerosis. Mosaic attenuation pattern to the lungs could be due to motion artifact, edema, or air trapping. Electronically Signed   By: Lucienne Capers M.D.   On: 07/31/2016 23:34   Dg Esophagus  Result Date: 08/01/2016 CLINICAL DATA:  Chest pain EXAM: ESOPHOGRAM/BARIUM SWALLOW TECHNIQUE: Single contrast examination was performed using  Omnipaque 300. FLUOROSCOPY TIME:  Fluoroscopy Time:  0 minutes 48 seconds Radiation Exposure Index (if provided by the fluoroscopic device): Number of Acquired Spot Images: 0 COMPARISON:  CT chest 07/31/2016 FINDINGS: Study is performed in the supine position. Mild decrease in esophageal motility. No mucosal edema. No ulcer or mass. Negative for stricture. Contrast flows freely into the stomach. IMPRESSION: Negative Electronically Signed   By: Franchot Gallo M.D.   On: 08/01/2016 14:55   Dg Hip Unilat With Pelvis 2-3 Views Right  Result Date: 08/01/2016 CLINICAL DATA:  Chronic right hip pain.  Initial encounter. EXAM: DG HIP (WITH OR WITHOUT PELVIS) 2-3V RIGHT COMPARISON:  None. FINDINGS: There is chronic superior joint space narrowing at the right hip. Mild axial joint space narrowing is noted at the left hip. There is diffuse sclerotic change at the right hip joint, with mild cortical irregularity. Mild sclerotic change is noted at  the sacroiliac joints bilaterally. Scattered vascular calcifications are seen. IMPRESSION: 1. Chronic superior joint space narrowing at the right hip, with mild cortical irregularity and diffuse sclerotic change, reflecting osteoarthritis. 2. Mild axial joint space narrowing at the left hip. 3. Scattered vascular calcifications seen. Electronically Signed   By: Garald Balding M.D.   On: 08/01/2016 00:54     Current Medications:  . aspirin EC  81 mg Oral Q48H  . enoxaparin (LOVENOX) injection  30 mg Subcutaneous Daily  . escitalopram  10 mg Oral Daily  . famotidine  20 mg Oral BID  . Influenza vac split quadrivalent PF  0.5 mL Intramuscular Tomorrow-1000  . isosorbide mononitrate  30 mg Oral Daily  . levothyroxine  75 mcg Oral QAC breakfast  .  multivitamin with minerals  1 tablet Oral QPC supper  . potassium chloride SA  20 mEq Oral Daily  . torsemide  40 mg Oral Q breakfast  . traMADol  100 mg Oral Q breakfast      ASSESSMENT AND PLAN: Principal Problem:   Pain in the chest Active Problems:   Constipation   FTT (failure to thrive) in adult   1. Chest pain: patient had very severe chest pain with radiation into her her throat associated with nausea and dry heaving. It was relieved after 2 sublingual nitroglycerin. She is not a good candidate for invasive workup due to frailty and advanced age.  Discussed cardiac catheterization with her and she is very clear that this is not something she would want anyway. She very well could have coronary artery disease. There was evidence of atherosclerosis in her aorta on chest x-ray. Her troponin is borderline elevated and flat with no diagnostic trend. ECG with no acute ST or T-wave changes.   No room for a beta blocker with sinus bradycardia. Continue daily ASA, Isosorbide 30mg  added yesterday, she is chest pain free. Continue this.   2. Chronic diastolic heart failure: She appears relatively euvolemic at this time. Would continue torsemide 40  mg daily.  3. Moderate AS: Doubt this is contributing to her chest pain.   4.Hypertension: Stable on only torsemide. Will add Imdur as above  5. Hypothyroidism: On Synthroid  6. Hyperkalemia: K is 5.7 this am, stopped KCl supplement.       Signed, Arbutus Leas , NP 8:28 AM 08/02/2016 Pager [phone]    Patient seen and examined. Agree with assessment and plan. Repeat K 4.2. Cr 1.23. TSH 2.38. Breathing better, no recurrent chest pain. Pain occurred after wretching, doubt ischemic. Continue torsemide of lower extremity edema and mild diastolic heart failure. Consider adding tylenol to tramadol with arthritic complaints.    Troy Sine, MD, [facility] 08/02/2016 11:31 AM

## 2016-08-02 NOTE — Progress Notes (Signed)
OT Cancellation Note  Patient Details Name: Kathy Hoover MRN: [id number] DOB: 1915/12/04   Cancelled Treatment:    Reason Eval/Treat Not Completed: Medical issues which prohibited therapy. K is 5.7. Will check back later as able/as appropriate  Britt Bottom 08/02/2016, 10:37 AM

## 2016-08-03 DIAGNOSIS — Z7982 Long term (current) use of aspirin: Secondary | ICD-10-CM | POA: Diagnosis not present

## 2016-08-03 DIAGNOSIS — M5136 Other intervertebral disc degeneration, lumbar region: Secondary | ICD-10-CM | POA: Diagnosis not present

## 2016-08-03 DIAGNOSIS — Z9181 History of falling: Secondary | ICD-10-CM | POA: Diagnosis not present

## 2016-08-03 DIAGNOSIS — M1611 Unilateral primary osteoarthritis, right hip: Secondary | ICD-10-CM | POA: Diagnosis not present

## 2016-08-03 DIAGNOSIS — Z9981 Dependence on supplemental oxygen: Secondary | ICD-10-CM | POA: Diagnosis not present

## 2016-08-03 DIAGNOSIS — I5032 Chronic diastolic (congestive) heart failure: Secondary | ICD-10-CM | POA: Diagnosis not present

## 2016-08-03 DIAGNOSIS — M6281 Muscle weakness (generalized): Secondary | ICD-10-CM | POA: Diagnosis not present

## 2016-08-03 DIAGNOSIS — R2689 Other abnormalities of gait and mobility: Secondary | ICD-10-CM | POA: Diagnosis not present

## 2016-08-03 DIAGNOSIS — I252 Old myocardial infarction: Secondary | ICD-10-CM | POA: Diagnosis not present

## 2016-08-03 DIAGNOSIS — I110 Hypertensive heart disease with heart failure: Secondary | ICD-10-CM | POA: Diagnosis not present

## 2016-08-07 ENCOUNTER — Ambulatory Visit: Payer: Medicare Other | Admitting: Cardiology

## 2016-08-07 DIAGNOSIS — M6281 Muscle weakness (generalized): Secondary | ICD-10-CM | POA: Diagnosis not present

## 2016-08-07 DIAGNOSIS — M1611 Unilateral primary osteoarthritis, right hip: Secondary | ICD-10-CM | POA: Diagnosis not present

## 2016-08-07 DIAGNOSIS — M5136 Other intervertebral disc degeneration, lumbar region: Secondary | ICD-10-CM | POA: Diagnosis not present

## 2016-08-07 DIAGNOSIS — I110 Hypertensive heart disease with heart failure: Secondary | ICD-10-CM | POA: Diagnosis not present

## 2016-08-07 DIAGNOSIS — I5032 Chronic diastolic (congestive) heart failure: Secondary | ICD-10-CM | POA: Diagnosis not present

## 2016-08-07 DIAGNOSIS — I252 Old myocardial infarction: Secondary | ICD-10-CM | POA: Diagnosis not present

## 2016-08-08 ENCOUNTER — Emergency Department (HOSPITAL_BASED_OUTPATIENT_CLINIC_OR_DEPARTMENT_OTHER)
Admission: EM | Admit: 2016-08-08 | Discharge: 2016-08-08 | Disposition: A | Discharge status: Home / Self Care | Payer: Medicare Other | Attending: Emergency Medicine | Admitting: Emergency Medicine

## 2016-08-08 ENCOUNTER — Encounter (HOSPITAL_BASED_OUTPATIENT_CLINIC_OR_DEPARTMENT_OTHER): Payer: Self-pay

## 2016-08-08 DIAGNOSIS — I252 Old myocardial infarction: Secondary | ICD-10-CM | POA: Diagnosis not present

## 2016-08-08 DIAGNOSIS — Z7982 Long term (current) use of aspirin: Secondary | ICD-10-CM | POA: Insufficient documentation

## 2016-08-08 DIAGNOSIS — I110 Hypertensive heart disease with heart failure: Secondary | ICD-10-CM | POA: Diagnosis not present

## 2016-08-08 DIAGNOSIS — I5032 Chronic diastolic (congestive) heart failure: Secondary | ICD-10-CM | POA: Insufficient documentation

## 2016-08-08 DIAGNOSIS — Z853 Personal history of malignant neoplasm of breast: Secondary | ICD-10-CM | POA: Insufficient documentation

## 2016-08-08 DIAGNOSIS — E039 Hypothyroidism, unspecified: Secondary | ICD-10-CM | POA: Diagnosis not present

## 2016-08-08 DIAGNOSIS — M1611 Unilateral primary osteoarthritis, right hip: Secondary | ICD-10-CM | POA: Diagnosis not present

## 2016-08-08 DIAGNOSIS — N3090 Cystitis, unspecified without hematuria: Secondary | ICD-10-CM | POA: Diagnosis not present

## 2016-08-08 DIAGNOSIS — N3091 Cystitis, unspecified with hematuria: Secondary | ICD-10-CM | POA: Insufficient documentation

## 2016-08-08 DIAGNOSIS — M6281 Muscle weakness (generalized): Secondary | ICD-10-CM | POA: Diagnosis not present

## 2016-08-08 DIAGNOSIS — R319 Hematuria, unspecified: Secondary | ICD-10-CM | POA: Diagnosis present

## 2016-08-08 DIAGNOSIS — M5136 Other intervertebral disc degeneration, lumbar region: Secondary | ICD-10-CM | POA: Diagnosis not present

## 2016-08-08 LAB — URINALYSIS, ROUTINE W REFLEX MICROSCOPIC
Bilirubin Urine: NEGATIVE
Glucose, UA: NEGATIVE mg/dL
Ketones, ur: NEGATIVE mg/dL
Nitrite: NEGATIVE
Protein, ur: 100 mg/dL — AB
Specific Gravity, Urine: 1.012 (ref 1.005–1.030)
pH: 6 (ref 5.0–8.0)

## 2016-08-08 LAB — URINE MICROSCOPIC-ADD ON

## 2016-08-08 MED ORDER — SULFAMETHOXAZOLE-TRIMETHOPRIM 800-160 MG PO TABS: 1.0000 | ORAL_TABLET | Freq: Two times a day (BID) | ORAL | 0 refills | Status: AC

## 2016-08-08 MED ORDER — SULFAMETHOXAZOLE-TRIMETHOPRIM 800-160 MG PO TABS
1.0000 | ORAL_TABLET | Freq: Once | ORAL | Status: AC
  Administered 2016-08-08: 1 via ORAL
  Filled 2016-08-08: qty 1

## 2016-08-08 NOTE — ED Notes (Signed)
States had blood in urine x 1 this pm,  Also states had left lower abd pain x 1 week in the am after waking

## 2016-08-08 NOTE — ED Notes (Signed)
Pt o2 sats down to 86% after being taken to bathroom,  Pt placed on o2 2l [place] and monitor  After 3 minutes of o2 sats back to 93,  Uses o2 at home during day

## 2016-08-08 NOTE — ED Triage Notes (Signed)
Per daughter in law pt had blood in toilet after voiding approx 530pm-pt NAD-denies pain-NAD-presents to triage in w/c

## 2016-08-08 NOTE — ED Provider Notes (Signed)
[place] DEPT [facility] Provider Note   CSN: [id number] Arrival date & time: 08/08/16  1920  By signing my name below, I, Kathy Hoover, attest that this documentation has been prepared under the direction and in the presence of Kathy Manifold, MD . Electronically Signed: Evelene Hoover, Scribe. 08/08/2016. 9:09 PM.  History   Chief Complaint Chief Complaint  Patient presents with  . Hematuria    The history is provided by the patient. No language interpreter was used.     HPI Comments:  Kathy Hoover is a 82 y.o. female who presents to the Emergency Department complaining of hematuria this evening. Pt states her daughter- in- law noticed blood in the toilet when she urinated. She reports associated urinary urgency; she last urinated ~ 1715.  She denies dysuria, melena. Pt also reports right sided abdominal pain x 1-2 weeks, no pain at this time. She  takes baby ASA every other day  PCP: [facility]     Past Medical History:  Diagnosis Date  . Anxiety   . Anxiety and depression   . Arthritis    "terribly in my hands" (01/13/2014)  . Bacterial pneumonia 01/13/2014  . Breast cancer ([place])   . Cervical disc disease    W radiculopathy S/P epidural injections  . Chest pain 07/2016  . Chronic bronchitis ([place])    "@ least once a year;  in the winter" (01/13/2014)  . Chronic diastolic CHF (congestive heart failure) ([facility])    Preserved EF  . Depression   . Exertional shortness of breath    "worse recently" (01/13/2014)  . GERD (gastroesophageal reflux disease)   . Hypertension   . Hypothyroidism   . Lower extremity edema    CHRONIC  . Macular degeneration    Dr Hillis Range Follows for pt.  . Moderate aortic stenosis 05/2012   by ECHO  . NSTEMI (non-ST elevated myocardial infarction) ([place])   . Pneumonia    "once before today" (01/13/2014)  . Vitamin D deficiency     Patient Active Problem List   Diagnosis Date Noted  . Pain in the chest 08/01/2016  . Constipation   .  FTT (failure to thrive) in adult   . Acute on chronic systolic congestive heart failure ([place]) 07/09/2016  . Cellulitis 07/09/2016  . Acute on chronic diastolic CHF (congestive heart failure) ([place]) 07/09/2016  . NSTEMI (non-ST elevated myocardial infarction) ([place]) 06/05/2015  . Onychomycosis 12/22/2014  . Ingrown nail 12/22/2014  . Pain in lower limb 12/22/2014  . SOB (shortness of breath) 07/28/2014  . Hypoxia 01/13/2014  . Chronic diastolic congestive heart failure ([place]) 01/13/2014  . Obesity, unspecified 01/13/2014  . Influenza with pneumonia 01/13/2014  . Essential hypertension, benign 12/22/2013  . Acquired stenosis of aortic valve 12/22/2013  . Encounter for long-term (current) use of other medications 12/22/2013    Past Surgical History:  Procedure Laterality Date  . ABDOMINAL HYSTERECTOMY    . APPENDECTOMY  1949  . BREAST BIOPSY Right ~ 2005  . BREAST LUMPECTOMY Right ~ 2005  . CATARACT EXTRACTION W/ INTRAOCULAR LENS  IMPLANT, BILATERAL Bilateral ~ 2008  . TONSILLECTOMY  1949    OB History    No data available       Home Medications    Prior to Admission medications   Medication Sig Start Date End Date Taking? Authorizing Provider  acetaminophen (TYLENOL) 500 MG tablet Take 1 tablet (500 mg total) by mouth daily. 08/02/16   Florencia Reasons, MD  acetaminophen (  TYLENOL) 500 MG tablet Take 1 tablet (500 mg total) by mouth every 8 (eight) hours as needed. 08/02/16   Florencia Reasons, MD  aspirin 81 MG EC tablet Take 1 tablet (81 mg total) by mouth every other day. Patient taking differently: Take 81 mg by mouth every other day. At night 06/28/15   Sueanne Margarita, MD  Cholecalciferol 2000 units CAPS Take 2,000 Units by mouth every evening.    Historical Provider, MD  clobetasol cream (TEMOVATE) [id number] % Apply 1 application topically daily.  07/17/16   Historical Provider, MD  escitalopram (LEXAPRO) 10 MG tablet Take 10 mg by mouth every morning. 07/16/16   Historical Provider, MD  famotidine  (PEPCID) 20 MG tablet Take 1 tablet (20 mg total) by mouth daily. 08/03/16   Florencia Reasons, MD  isosorbide mononitrate (IMDUR) 30 MG 24 hr tablet Take 1 tablet (30 mg total) by mouth daily. 08/03/16   Florencia Reasons, MD  levothyroxine (SYNTHROID, LEVOTHROID) 75 MCG tablet Take 1 tablet (75 mcg total) by mouth daily before breakfast. 12/28/15   Lelon Perla, MD  Multiple Vitamin (MULTIVITAMIN WITH MINERALS) TABS Take 1 tablet by mouth daily after supper.     Historical Provider, MD  Multiple Vitamins-Minerals (PRESERVISION AREDS 2 PO) Take 2 tablets by mouth every morning.     Historical Provider, MD  nitroGLYCERIN (NITROSTAT) 0.4 MG SL tablet Place 1 tablet (0.4 mg total) under the tongue every 5 (five) minutes as needed for chest pain. 06/13/15   Sueanne Margarita, MD  Polyethyl Glycol-Propyl Glycol (SYSTANE ULTRA) 0.4-0.3 % SOLN Apply 1 drop to eye 2 (two) times daily as needed.    Historical Provider, MD  potassium chloride 20 MEQ TBCR Take 20 mEq by mouth daily. 07/12/16   Shanker Kristeen Mans, MD  Probiotic Product (SOLUBLE FIBER/PROBIOTICS PO) Take 1 tablet by mouth daily.    Historical Provider, MD  torsemide (DEMADEX) 20 MG tablet Take 2 tablets (40 mg total) by mouth daily. Patient taking differently: Take 40 mg by mouth 2 (two) times daily as needed (40 mg every morning and an additional 20 mg during the day as needed for fluid retention).  07/12/16   Shanker Kristeen Mans, MD  traMADol (ULTRAM) 50 MG tablet Take 100 mg by mouth every morning. 07/20/16   Historical Provider, MD  vitamin C (ASCORBIC ACID) 500 MG tablet Take 500 mg by mouth daily.    Historical Provider, MD    Family History Family History  Problem Relation Age of Onset  . CVA Father   . Pneumonia Father   . Multiple sclerosis Brother   . CVA Brother     Social History Social History  Substance Use Topics  . Smoking status: Never Smoker  . Smokeless tobacco: Never Used  . Alcohol use No     Allergies   Penicillins; Ciprofloxacin;  Codeine; and Metronidazole   Review of Systems Review of Systems  Constitutional: Negative for chills and fever.  Respiratory: Negative for shortness of breath.   Cardiovascular: Negative for chest pain.  Gastrointestinal: Positive for abdominal pain. Negative for blood in stool and vomiting.  Genitourinary: Positive for hematuria and urgency. Negative for dysuria.  All other systems reviewed and are negative.    Physical Exam Updated Vital Signs BP 118/82 (BP Location: Left Arm)   Pulse 90   Temp 98.1 F (36.7 C) (Oral)   Resp 20   SpO2 97% Comment: best reading from both hands  Physical Exam  Constitutional: She is  oriented to person, place, and time. She appears well-developed and well-nourished. No distress.  HENT:  Head: Normocephalic and atraumatic.  Eyes: EOM are normal.  Neck: Normal range of motion.  Cardiovascular: Normal rate, regular rhythm and normal heart sounds.   Pulmonary/Chest: Effort normal and breath sounds normal.  Abdominal: Soft. She exhibits no distension. There is tenderness (mild) in the suprapubic area.  Musculoskeletal: Normal range of motion.  Neurological: She is alert and oriented to person, place, and time.  Skin: Skin is warm and dry.  Psychiatric: She has a normal mood and affect. Judgment normal.  Nursing note and vitals reviewed.  ED Treatments / Results  DIAGNOSTIC STUDIES:  Oxygen Saturation is 97% on RA, normal by my interpretation.    COORDINATION OF CARE:  9:05 PM Discussed treatment plan with pt at bedside and pt agreed to plan.  Labs (all labs ordered are listed, but only abnormal results are displayed) Labs Reviewed  URINALYSIS, ROUTINE W REFLEX MICROSCOPIC (NOT AT [facility])    EKG  EKG Interpretation None       Radiology No results found.  Procedures Procedures (including critical care time)  Medications Ordered in ED Medications - No data to display   Initial Impression / Assessment and Plan / ED Course    I have reviewed the triage vital signs and the nursing notes.  Pertinent labs & imaging results that were available during my care of the patient were reviewed by me and considered in my medical decision making (see chart for details).  Clinical Course    89yF with hematuria. UA consistent with UTI. HD stable. Afebrile. I feel  appropriate for outpt tx. PCN allergy. Given bactrim.  Final Clinical Impressions(s) / ED Diagnoses   Final diagnoses:  Hemorrhagic cystitis    New Prescriptions New Prescriptions   No medications on file   I personally preformed the services scribed in my presence. The recorded information has been reviewed is accurate. Kathy Manifold, MD.     Kathy Manifold, MD 08/14/16 [phone]

## 2016-08-09 DIAGNOSIS — I5033 Acute on chronic diastolic (congestive) heart failure: Secondary | ICD-10-CM | POA: Diagnosis not present

## 2016-08-09 DIAGNOSIS — M1711 Unilateral primary osteoarthritis, right knee: Secondary | ICD-10-CM | POA: Diagnosis not present

## 2016-08-09 DIAGNOSIS — R0602 Shortness of breath: Secondary | ICD-10-CM | POA: Diagnosis not present

## 2016-08-09 DIAGNOSIS — Z6832 Body mass index (BMI) 32.0-32.9, adult: Secondary | ICD-10-CM | POA: Diagnosis not present

## 2016-08-09 DIAGNOSIS — N3001 Acute cystitis with hematuria: Secondary | ICD-10-CM | POA: Diagnosis not present

## 2016-08-09 DIAGNOSIS — I214 Non-ST elevation (NSTEMI) myocardial infarction: Secondary | ICD-10-CM | POA: Diagnosis not present

## 2016-08-09 DIAGNOSIS — R609 Edema, unspecified: Secondary | ICD-10-CM | POA: Diagnosis not present

## 2016-08-09 DIAGNOSIS — E669 Obesity, unspecified: Secondary | ICD-10-CM | POA: Diagnosis not present

## 2016-08-09 DIAGNOSIS — M7061 Trochanteric bursitis, right hip: Secondary | ICD-10-CM | POA: Diagnosis not present

## 2016-08-10 DIAGNOSIS — I252 Old myocardial infarction: Secondary | ICD-10-CM | POA: Diagnosis not present

## 2016-08-10 DIAGNOSIS — M5136 Other intervertebral disc degeneration, lumbar region: Secondary | ICD-10-CM | POA: Diagnosis not present

## 2016-08-10 DIAGNOSIS — M6281 Muscle weakness (generalized): Secondary | ICD-10-CM | POA: Diagnosis not present

## 2016-08-10 DIAGNOSIS — I110 Hypertensive heart disease with heart failure: Secondary | ICD-10-CM | POA: Diagnosis not present

## 2016-08-10 DIAGNOSIS — I5032 Chronic diastolic (congestive) heart failure: Secondary | ICD-10-CM | POA: Diagnosis not present

## 2016-08-10 DIAGNOSIS — M1611 Unilateral primary osteoarthritis, right hip: Secondary | ICD-10-CM | POA: Diagnosis not present

## 2016-08-11 ENCOUNTER — Emergency Department (HOSPITAL_BASED_OUTPATIENT_CLINIC_OR_DEPARTMENT_OTHER): Payer: Medicare Other

## 2016-08-11 ENCOUNTER — Emergency Department (HOSPITAL_BASED_OUTPATIENT_CLINIC_OR_DEPARTMENT_OTHER)
Admission: EM | Admit: 2016-08-11 | Discharge: 2016-08-11 | Disposition: A | Discharge status: Home / Self Care | Payer: Medicare Other | Attending: Emergency Medicine | Admitting: Emergency Medicine

## 2016-08-11 ENCOUNTER — Encounter (HOSPITAL_BASED_OUTPATIENT_CLINIC_OR_DEPARTMENT_OTHER): Payer: Self-pay | Admitting: *Deleted

## 2016-08-11 DIAGNOSIS — I4891 Unspecified atrial fibrillation: Secondary | ICD-10-CM | POA: Diagnosis not present

## 2016-08-11 DIAGNOSIS — I10 Essential (primary) hypertension: Secondary | ICD-10-CM | POA: Diagnosis not present

## 2016-08-11 DIAGNOSIS — R110 Nausea: Secondary | ICD-10-CM | POA: Insufficient documentation

## 2016-08-11 DIAGNOSIS — Z7982 Long term (current) use of aspirin: Secondary | ICD-10-CM | POA: Insufficient documentation

## 2016-08-11 DIAGNOSIS — R600 Localized edema: Secondary | ICD-10-CM | POA: Insufficient documentation

## 2016-08-11 DIAGNOSIS — Z79899 Other long term (current) drug therapy: Secondary | ICD-10-CM | POA: Insufficient documentation

## 2016-08-11 DIAGNOSIS — Z853 Personal history of malignant neoplasm of breast: Secondary | ICD-10-CM | POA: Diagnosis not present

## 2016-08-11 DIAGNOSIS — I110 Hypertensive heart disease with heart failure: Secondary | ICD-10-CM | POA: Insufficient documentation

## 2016-08-11 DIAGNOSIS — E039 Hypothyroidism, unspecified: Secondary | ICD-10-CM | POA: Insufficient documentation

## 2016-08-11 DIAGNOSIS — I5043 Acute on chronic combined systolic (congestive) and diastolic (congestive) heart failure: Secondary | ICD-10-CM | POA: Diagnosis not present

## 2016-08-11 DIAGNOSIS — K2970 Gastritis, unspecified, without bleeding: Secondary | ICD-10-CM | POA: Diagnosis not present

## 2016-08-11 DIAGNOSIS — R0600 Dyspnea, unspecified: Secondary | ICD-10-CM | POA: Diagnosis present

## 2016-08-11 LAB — URINALYSIS, ROUTINE W REFLEX MICROSCOPIC
Bilirubin Urine: NEGATIVE
GLUCOSE, UA: NEGATIVE mg/dL
HGB URINE DIPSTICK: NEGATIVE
Ketones, ur: NEGATIVE mg/dL
LEUKOCYTES UA: NEGATIVE
Nitrite: NEGATIVE
Protein, ur: NEGATIVE mg/dL
SPECIFIC GRAVITY, URINE: 1.01 (ref 1.005–1.030)
pH: 6.5 (ref 5.0–8.0)

## 2016-08-11 LAB — COMPREHENSIVE METABOLIC PANEL
ALBUMIN: 3.7 g/dL (ref 3.5–5.0)
ALT: 21 U/L (ref 14–54)
AST: 27 U/L (ref 15–41)
Alkaline Phosphatase: 126 U/L (ref 38–126)
Anion gap: 11 (ref 5–15)
BILIRUBIN TOTAL: 0.7 mg/dL (ref 0.3–1.2)
BUN: 30 mg/dL — AB (ref 6–20)
CHLORIDE: 96 mmol/L — AB (ref 101–111)
CO2: 28 mmol/L (ref 22–32)
Calcium: 9.1 mg/dL (ref 8.9–10.3)
Creatinine, Ser: 1.76 mg/dL — ABNORMAL HIGH (ref 0.44–1.00)
GFR calc Af Amer: 27 mL/min — ABNORMAL LOW (ref >60)
GFR calc non Af Amer: 23 mL/min — ABNORMAL LOW (ref >60)
GLUCOSE: 105 mg/dL — AB (ref 65–99)
POTASSIUM: 4 mmol/L (ref 3.5–5.1)
Sodium: 135 mmol/L (ref 135–145)
TOTAL PROTEIN: 7.2 g/dL (ref 6.5–8.1)

## 2016-08-11 LAB — CBC WITH DIFFERENTIAL/PLATELET
Basophils Absolute: 0 10*3/uL (ref 0.0–0.1)
Basophils Relative: 1 %
Eosinophils Absolute: 0 10*3/uL (ref 0.0–0.7)
Eosinophils Relative: 1 %
HEMATOCRIT: 31.4 % — AB (ref 36.0–46.0)
Hemoglobin: 10 g/dL — ABNORMAL LOW (ref 12.0–15.0)
LYMPHS ABS: 1 10*3/uL (ref 0.7–4.0)
LYMPHS PCT: 14 %
MCH: 30.2 pg (ref 26.0–34.0)
MCHC: 31.8 g/dL (ref 30.0–36.0)
MCV: 94.9 fL (ref 78.0–100.0)
MONO ABS: 1 10*3/uL (ref 0.1–1.0)
MONOS PCT: 14 %
NEUTROS ABS: 5 10*3/uL (ref 1.7–7.7)
Neutrophils Relative %: 70 %
Platelets: 210 10*3/uL (ref 150–400)
RBC: 3.31 MIL/uL — ABNORMAL LOW (ref 3.87–5.11)
RDW: 15.7 % — AB (ref 11.5–15.5)
WBC: 7 10*3/uL (ref 4.0–10.5)

## 2016-08-11 LAB — TROPONIN I: Troponin I: 0.04 ng/mL (ref <0.03)

## 2016-08-11 LAB — URINE CULTURE: Culture: >=100000 — AB

## 2016-08-11 LAB — LIPASE, BLOOD: Lipase: 23 U/L (ref 11–51)

## 2016-08-11 LAB — BRAIN NATRIURETIC PEPTIDE: B Natriuretic Peptide: 397.7 pg/mL — ABNORMAL HIGH (ref 0.0–100.0)

## 2016-08-11 MED ORDER — METOPROLOL TARTRATE 25 MG PO TABS: 12.5000 mg | ORAL_TABLET | Freq: Two times a day (BID) | ORAL | 0 refills | Status: AC

## 2016-08-11 MED ORDER — ONDANSETRON HCL 4 MG/2ML IJ SOLN
4.0000 mg | Freq: Once | INTRAMUSCULAR | Status: AC
  Administered 2016-08-11: 4 mg via INTRAVENOUS
  Filled 2016-08-11: qty 2

## 2016-08-11 MED ORDER — PROMETHAZINE HCL 25 MG PO TABS: 25.0000 mg | ORAL_TABLET | Freq: Four times a day (QID) | ORAL | 0 refills | Status: AC | PRN

## 2016-08-11 MED ORDER — SODIUM CHLORIDE 0.9 % IV BOLUS (SEPSIS)
500.0000 mL | Freq: Once | INTRAVENOUS | Status: AC
  Administered 2016-08-11: 500 mL via INTRAVENOUS

## 2016-08-11 NOTE — ED Provider Notes (Signed)
[facility] DEPT [facility] Provider Note   CSN: [id number] Arrival date & time: 08/11/16  [id number]     History   Chief Complaint Chief Complaint  Patient presents with  . Nausea    HPI Kathy Hoover is a 82 y.o. female.  Patient is a 82 year old female with past medical history of congestive heart failure, chronic UTIs, hypertension, and aortic stenosis. She presents for evaluation of nausea and increased dyspnea on exertion and orthopnea over the past several days. She denies any fevers or chills. She denies any chest pain. She denies any productive cough. She does report increased swelling of her legs, and also has an increased need to wear her oxygen at home. She reports her saturations seemed to drop when she lies back.  She is currently being treated for a urinary tract infection with Bactrim. This was started several days ago when she was evaluated for similar symptoms. She was found to have this urinary tract infection and discharged. She continues to feel nauseated.      Past Medical History:  Diagnosis Date  . Anxiety   . Anxiety and depression   . Arthritis    "terribly in my hands" (01/13/2014)  . Bacterial pneumonia 01/13/2014  . Breast cancer ([place])   . Cervical disc disease    W radiculopathy S/P epidural injections  . Chest pain 07/2016  . Chronic bronchitis ([place])    "@ least once a year;  in the winter" (01/13/2014)  . Chronic diastolic CHF (congestive heart failure) ([facility])    Preserved EF  . Depression   . Exertional shortness of breath    "worse recently" (01/13/2014)  . GERD (gastroesophageal reflux disease)   . Hypertension   . Hypothyroidism   . Lower extremity edema    CHRONIC  . Macular degeneration    Dr Hillis Range Follows for pt.  . Moderate aortic stenosis 05/2012   by ECHO  . NSTEMI (non-ST elevated myocardial infarction) ([place])   . Pneumonia    "once before today" (01/13/2014)  . Vitamin D deficiency     Patient Active Problem List   Diagnosis Date  Noted  . Pain in the chest 08/01/2016  . Constipation   . FTT (failure to thrive) in adult   . Acute on chronic systolic congestive heart failure ([place]) 07/09/2016  . Cellulitis 07/09/2016  . Acute on chronic diastolic CHF (congestive heart failure) ([place]) 07/09/2016  . NSTEMI (non-ST elevated myocardial infarction) ([place]) 06/05/2015  . Onychomycosis 12/22/2014  . Ingrown nail 12/22/2014  . Pain in lower limb 12/22/2014  . SOB (shortness of breath) 07/28/2014  . Hypoxia 01/13/2014  . Chronic diastolic congestive heart failure ([place]) 01/13/2014  . Obesity, unspecified 01/13/2014  . Influenza with pneumonia 01/13/2014  . Essential hypertension, benign 12/22/2013  . Acquired stenosis of aortic valve 12/22/2013  . Encounter for long-term (current) use of other medications 12/22/2013    Past Surgical History:  Procedure Laterality Date  . ABDOMINAL HYSTERECTOMY    . APPENDECTOMY  1949  . BREAST BIOPSY Right ~ 2005  . BREAST LUMPECTOMY Right ~ 2005  . CATARACT EXTRACTION W/ INTRAOCULAR LENS  IMPLANT, BILATERAL Bilateral ~ 2008  . TONSILLECTOMY  1949    OB History    No data available       Home Medications    Prior to Admission medications   Medication Sig Start Date End Date Taking? Authorizing Provider  acetaminophen (TYLENOL) 500 MG tablet Take 1 tablet (500 mg total) by mouth daily. 08/02/16  Florencia Reasons, MD  acetaminophen (TYLENOL) 500 MG tablet Take 1 tablet (500 mg total) by mouth every 8 (eight) hours as needed. 08/02/16   Florencia Reasons, MD  aspirin 81 MG EC tablet Take 1 tablet (81 mg total) by mouth every other day. Patient taking differently: Take 81 mg by mouth every other day. At night 06/28/15   Sueanne Margarita, MD  Cholecalciferol 2000 units CAPS Take 2,000 Units by mouth every evening.    Historical Provider, MD  clobetasol cream (TEMOVATE) [id number] % Apply 1 application topically daily.  07/17/16   Historical Provider, MD  escitalopram (LEXAPRO) 10 MG tablet Take 10 mg by mouth  every morning. 07/16/16   Historical Provider, MD  famotidine (PEPCID) 20 MG tablet Take 1 tablet (20 mg total) by mouth daily. 08/03/16   Florencia Reasons, MD  isosorbide mononitrate (IMDUR) 30 MG 24 hr tablet Take 1 tablet (30 mg total) by mouth daily. 08/03/16   Florencia Reasons, MD  levothyroxine (SYNTHROID, LEVOTHROID) 75 MCG tablet Take 1 tablet (75 mcg total) by mouth daily before breakfast. 12/28/15   Lelon Perla, MD  Multiple Vitamin (MULTIVITAMIN WITH MINERALS) TABS Take 1 tablet by mouth daily after supper.     Historical Provider, MD  Multiple Vitamins-Minerals (PRESERVISION AREDS 2 PO) Take 2 tablets by mouth every morning.     Historical Provider, MD  nitroGLYCERIN (NITROSTAT) 0.4 MG SL tablet Place 1 tablet (0.4 mg total) under the tongue every 5 (five) minutes as needed for chest pain. 06/13/15   Sueanne Margarita, MD  Polyethyl Glycol-Propyl Glycol (SYSTANE ULTRA) 0.4-0.3 % SOLN Apply 1 drop to eye 2 (two) times daily as needed.    Historical Provider, MD  potassium chloride 20 MEQ TBCR Take 20 mEq by mouth daily. 07/12/16   Shanker Kristeen Mans, MD  Probiotic Product (SOLUBLE FIBER/PROBIOTICS PO) Take 1 tablet by mouth daily.    Historical Provider, MD  sulfamethoxazole-trimethoprim (BACTRIM DS,SEPTRA DS) 800-160 MG tablet Take 1 tablet by mouth 2 (two) times daily. 08/08/16 08/15/16  Virgel Manifold, MD  torsemide (DEMADEX) 20 MG tablet Take 2 tablets (40 mg total) by mouth daily. Patient taking differently: Take 40 mg by mouth 2 (two) times daily as needed (40 mg every morning and an additional 20 mg during the day as needed for fluid retention).  07/12/16   Shanker Kristeen Mans, MD  traMADol (ULTRAM) 50 MG tablet Take 100 mg by mouth every morning. 07/20/16   Historical Provider, MD  vitamin C (ASCORBIC ACID) 500 MG tablet Take 500 mg by mouth daily.    Historical Provider, MD    Family History Family History  Problem Relation Age of Onset  . CVA Father   . Pneumonia Father   . Multiple sclerosis Brother   .  CVA Brother     Social History Social History  Substance Use Topics  . Smoking status: Never Smoker  . Smokeless tobacco: Never Used  . Alcohol use No     Allergies   Penicillins; Ciprofloxacin; Codeine; and Metronidazole   Review of Systems Review of Systems  All other systems reviewed and are negative.    Physical Exam Updated Vital Signs BP 125/90 (BP Location: Right Arm)   Pulse 118 Comment: IRREGULAR  Temp 98.4 F (36.9 C) (Oral)   Resp 20   Ht 4\' 11"  (1.499 m)   Wt 168 lb (76.2 kg)   SpO2 98%   BMI 33.93 kg/m   Physical Exam  Constitutional: She is oriented  to person, place, and time. She appears well-developed and well-nourished. No distress.  HENT:  Head: Normocephalic and atraumatic.  Neck: Normal range of motion. Neck supple.  Cardiovascular: Exam reveals no gallop and no friction rub.   Murmur heard. Heart is irregularly irregular. There is a systolic ejection murmur heard best at the right upper sternal border.  Pulmonary/Chest: Effort normal. She has no wheezes. She has rales.  There are slight rales in the bases bilaterally.  Abdominal: Soft. Bowel sounds are normal. She exhibits no distension. There is no tenderness.  Musculoskeletal: Normal range of motion. She exhibits edema.  There is 3+ pitting edema of both lower extremities.  Neurological: She is alert and oriented to person, place, and time.  Skin: Skin is warm and dry. She is not diaphoretic.  Nursing note and vitals reviewed.    ED Treatments / Results  Labs (all labs ordered are listed, but only abnormal results are displayed) Labs Reviewed  COMPREHENSIVE METABOLIC PANEL  TROPONIN I  CBC WITH DIFFERENTIAL/PLATELET  BRAIN NATRIURETIC PEPTIDE  LIPASE, BLOOD  URINALYSIS, ROUTINE W REFLEX MICROSCOPIC (NOT AT [facility])    EKG  EKG Interpretation None       Radiology No results found.  Procedures Procedures (including critical care time)  Medications Ordered in  ED Medications  sodium chloride 0.9 % bolus 500 mL (not administered)  ondansetron (ZOFRAN) injection 4 mg (not administered)     Initial Impression / Assessment and Plan / ED Course  I have reviewed the triage vital signs and the nursing notes.  Pertinent labs & imaging results that were available during my care of the patient were reviewed by me and considered in my medical decision making (see chart for details).  Clinical Course    Patient presents with complaints of nausea, the etiology of which I am uncertain. Her auditory studies are unremarkable and urinalysis is clear. She is currently taking Bactrim for UTI. She will be given Zofran and I believe is appropriate for discharge to home. Her son and daughter-in-law had multiple questions regarding her home medications and I have advised them to follow this up with her primary Dr.  She was found to be in atrial fibrillation, which appears to be new. I've discussed this finding with Dr. Acie Fredrickson from cardiology who recommends low-dose beta blocker and possibly Eliquis. I've discussed the possibility of taking a blood thinner with this patient, however she refuses. She appears to be somewhat unsteady on her feet and is likely a fall risk to him the risks of this medication may outweigh the benefits. She has declined to start it at this time and is to follow-up with her cardiologist, Dr. Stanford Breed on Wednesday as scheduled  Final Clinical Impressions(s) / ED Diagnoses   Final diagnoses:  None    New Prescriptions New Prescriptions   No medications on file     Veryl Speak, MD 08/12/16 [phone]

## 2016-08-11 NOTE — Discharge Instructions (Signed)
Begin taking metoprolol as prescribed.  Begin taking Phenergan as prescribed as needed for nausea.  Follow-up with Dr. Stanford Breed this week as scheduled, and return to the ER if symptoms significantly worsen or change.

## 2016-08-11 NOTE — ED Notes (Signed)
MD at bedside. 

## 2016-08-11 NOTE — ED Triage Notes (Signed)
Per EMS, patient has been experiencing n/v over the past month. Patient states that her primary MD informed her that she has been treated for a UTI. Patient alert and oriented.

## 2016-08-12 ENCOUNTER — Telehealth (HOSPITAL_BASED_OUTPATIENT_CLINIC_OR_DEPARTMENT_OTHER): Payer: Self-pay

## 2016-08-12 NOTE — Telephone Encounter (Signed)
Post ED Visit - Positive Culture Follow-up  Culture report reviewed by antimicrobial stewardship pharmacist:  []  Elenor Quinones, Pharm.D. []  Heide Guile, Pharm.D., BCPS []  Parks Neptune, Pharm.D. []  Alycia Rossetti, Pharm.D., BCPS []  [place], [place].D., BCPS, AAHIVP []  Legrand Como, Pharm.D., BCPS, AAHIVP []  Milus Glazier, Pharm.D. []  Stephens November, Pharm.D. Dimitri Ped Pharm D Positive urine culture Treated with Sulfamethoxazole-Trimethoprim, organism sensitive to the same and no further patient follow-up is required at this time.  Genia Del 08/12/2016, 9:18 AM

## 2016-08-13 DIAGNOSIS — D649 Anemia, unspecified: Secondary | ICD-10-CM | POA: Diagnosis not present

## 2016-08-13 DIAGNOSIS — I25119 Atherosclerotic heart disease of native coronary artery with unspecified angina pectoris: Secondary | ICD-10-CM | POA: Diagnosis not present

## 2016-08-13 DIAGNOSIS — K5792 Diverticulitis of intestine, part unspecified, without perforation or abscess without bleeding: Secondary | ICD-10-CM | POA: Diagnosis not present

## 2016-08-13 DIAGNOSIS — I350 Nonrheumatic aortic (valve) stenosis: Secondary | ICD-10-CM | POA: Diagnosis not present

## 2016-08-13 DIAGNOSIS — I130 Hypertensive heart and chronic kidney disease with heart failure and stage 1 through stage 4 chronic kidney disease, or unspecified chronic kidney disease: Secondary | ICD-10-CM | POA: Diagnosis not present

## 2016-08-13 DIAGNOSIS — I5032 Chronic diastolic (congestive) heart failure: Secondary | ICD-10-CM | POA: Diagnosis not present

## 2016-08-13 DIAGNOSIS — E669 Obesity, unspecified: Secondary | ICD-10-CM | POA: Diagnosis not present

## 2016-08-13 DIAGNOSIS — L97912 Non-pressure chronic ulcer of unspecified part of right lower leg with fat layer exposed: Secondary | ICD-10-CM | POA: Diagnosis not present

## 2016-08-13 DIAGNOSIS — N183 Chronic kidney disease, stage 3 (moderate): Secondary | ICD-10-CM | POA: Diagnosis not present

## 2016-08-13 DIAGNOSIS — I482 Chronic atrial fibrillation: Secondary | ICD-10-CM | POA: Diagnosis not present

## 2016-08-14 DIAGNOSIS — I482 Chronic atrial fibrillation: Secondary | ICD-10-CM | POA: Diagnosis not present

## 2016-08-14 DIAGNOSIS — I5032 Chronic diastolic (congestive) heart failure: Secondary | ICD-10-CM | POA: Diagnosis not present

## 2016-08-14 DIAGNOSIS — L97912 Non-pressure chronic ulcer of unspecified part of right lower leg with fat layer exposed: Secondary | ICD-10-CM | POA: Diagnosis not present

## 2016-08-14 DIAGNOSIS — I130 Hypertensive heart and chronic kidney disease with heart failure and stage 1 through stage 4 chronic kidney disease, or unspecified chronic kidney disease: Secondary | ICD-10-CM | POA: Diagnosis not present

## 2016-08-14 DIAGNOSIS — E669 Obesity, unspecified: Secondary | ICD-10-CM | POA: Diagnosis not present

## 2016-08-14 DIAGNOSIS — I350 Nonrheumatic aortic (valve) stenosis: Secondary | ICD-10-CM | POA: Diagnosis not present

## 2016-08-15 DIAGNOSIS — E669 Obesity, unspecified: Secondary | ICD-10-CM | POA: Diagnosis not present

## 2016-08-15 DIAGNOSIS — L97912 Non-pressure chronic ulcer of unspecified part of right lower leg with fat layer exposed: Secondary | ICD-10-CM | POA: Diagnosis not present

## 2016-08-15 DIAGNOSIS — I130 Hypertensive heart and chronic kidney disease with heart failure and stage 1 through stage 4 chronic kidney disease, or unspecified chronic kidney disease: Secondary | ICD-10-CM | POA: Diagnosis not present

## 2016-08-15 DIAGNOSIS — I5032 Chronic diastolic (congestive) heart failure: Secondary | ICD-10-CM | POA: Diagnosis not present

## 2016-08-15 DIAGNOSIS — I482 Chronic atrial fibrillation: Secondary | ICD-10-CM | POA: Diagnosis not present

## 2016-08-15 DIAGNOSIS — I350 Nonrheumatic aortic (valve) stenosis: Secondary | ICD-10-CM | POA: Diagnosis not present

## 2016-08-17 ENCOUNTER — Ambulatory Visit: Payer: Medicare Other | Admitting: Physician Assistant

## 2016-08-18 DIAGNOSIS — I482 Chronic atrial fibrillation: Secondary | ICD-10-CM | POA: Diagnosis not present

## 2016-08-18 DIAGNOSIS — E669 Obesity, unspecified: Secondary | ICD-10-CM | POA: Diagnosis not present

## 2016-08-18 DIAGNOSIS — I350 Nonrheumatic aortic (valve) stenosis: Secondary | ICD-10-CM | POA: Diagnosis not present

## 2016-08-18 DIAGNOSIS — L97912 Non-pressure chronic ulcer of unspecified part of right lower leg with fat layer exposed: Secondary | ICD-10-CM | POA: Diagnosis not present

## 2016-08-18 DIAGNOSIS — I5032 Chronic diastolic (congestive) heart failure: Secondary | ICD-10-CM | POA: Diagnosis not present

## 2016-08-18 DIAGNOSIS — I130 Hypertensive heart and chronic kidney disease with heart failure and stage 1 through stage 4 chronic kidney disease, or unspecified chronic kidney disease: Secondary | ICD-10-CM | POA: Diagnosis not present

## 2016-08-20 DIAGNOSIS — E669 Obesity, unspecified: Secondary | ICD-10-CM | POA: Diagnosis not present

## 2016-08-20 DIAGNOSIS — L97912 Non-pressure chronic ulcer of unspecified part of right lower leg with fat layer exposed: Secondary | ICD-10-CM | POA: Diagnosis not present

## 2016-08-20 DIAGNOSIS — I5032 Chronic diastolic (congestive) heart failure: Secondary | ICD-10-CM | POA: Diagnosis not present

## 2016-08-20 DIAGNOSIS — I350 Nonrheumatic aortic (valve) stenosis: Secondary | ICD-10-CM | POA: Diagnosis not present

## 2016-08-20 DIAGNOSIS — I482 Chronic atrial fibrillation: Secondary | ICD-10-CM | POA: Diagnosis not present

## 2016-08-20 DIAGNOSIS — I130 Hypertensive heart and chronic kidney disease with heart failure and stage 1 through stage 4 chronic kidney disease, or unspecified chronic kidney disease: Secondary | ICD-10-CM | POA: Diagnosis not present

## 2016-08-21 DIAGNOSIS — I130 Hypertensive heart and chronic kidney disease with heart failure and stage 1 through stage 4 chronic kidney disease, or unspecified chronic kidney disease: Secondary | ICD-10-CM | POA: Diagnosis not present

## 2016-08-21 DIAGNOSIS — L97912 Non-pressure chronic ulcer of unspecified part of right lower leg with fat layer exposed: Secondary | ICD-10-CM | POA: Diagnosis not present

## 2016-08-21 DIAGNOSIS — I350 Nonrheumatic aortic (valve) stenosis: Secondary | ICD-10-CM | POA: Diagnosis not present

## 2016-08-21 DIAGNOSIS — I482 Chronic atrial fibrillation: Secondary | ICD-10-CM | POA: Diagnosis not present

## 2016-08-21 DIAGNOSIS — E669 Obesity, unspecified: Secondary | ICD-10-CM | POA: Diagnosis not present

## 2016-08-21 DIAGNOSIS — I5032 Chronic diastolic (congestive) heart failure: Secondary | ICD-10-CM | POA: Diagnosis not present

## 2016-08-22 DIAGNOSIS — I482 Chronic atrial fibrillation: Secondary | ICD-10-CM | POA: Diagnosis not present

## 2016-08-22 DIAGNOSIS — I5032 Chronic diastolic (congestive) heart failure: Secondary | ICD-10-CM | POA: Diagnosis not present

## 2016-08-22 DIAGNOSIS — I130 Hypertensive heart and chronic kidney disease with heart failure and stage 1 through stage 4 chronic kidney disease, or unspecified chronic kidney disease: Secondary | ICD-10-CM | POA: Diagnosis not present

## 2016-08-22 DIAGNOSIS — I350 Nonrheumatic aortic (valve) stenosis: Secondary | ICD-10-CM | POA: Diagnosis not present

## 2016-08-22 DIAGNOSIS — E669 Obesity, unspecified: Secondary | ICD-10-CM | POA: Diagnosis not present

## 2016-08-22 DIAGNOSIS — L97912 Non-pressure chronic ulcer of unspecified part of right lower leg with fat layer exposed: Secondary | ICD-10-CM | POA: Diagnosis not present

## 2016-08-24 DIAGNOSIS — I5032 Chronic diastolic (congestive) heart failure: Secondary | ICD-10-CM | POA: Diagnosis not present

## 2016-08-24 DIAGNOSIS — I350 Nonrheumatic aortic (valve) stenosis: Secondary | ICD-10-CM | POA: Diagnosis not present

## 2016-08-24 DIAGNOSIS — I130 Hypertensive heart and chronic kidney disease with heart failure and stage 1 through stage 4 chronic kidney disease, or unspecified chronic kidney disease: Secondary | ICD-10-CM | POA: Diagnosis not present

## 2016-08-24 DIAGNOSIS — I482 Chronic atrial fibrillation: Secondary | ICD-10-CM | POA: Diagnosis not present

## 2016-08-24 DIAGNOSIS — L97912 Non-pressure chronic ulcer of unspecified part of right lower leg with fat layer exposed: Secondary | ICD-10-CM | POA: Diagnosis not present

## 2016-08-24 DIAGNOSIS — E669 Obesity, unspecified: Secondary | ICD-10-CM | POA: Diagnosis not present

## 2016-08-27 DIAGNOSIS — I482 Chronic atrial fibrillation: Secondary | ICD-10-CM | POA: Diagnosis not present

## 2016-08-27 DIAGNOSIS — E669 Obesity, unspecified: Secondary | ICD-10-CM | POA: Diagnosis not present

## 2016-08-27 DIAGNOSIS — L97912 Non-pressure chronic ulcer of unspecified part of right lower leg with fat layer exposed: Secondary | ICD-10-CM | POA: Diagnosis not present

## 2016-08-27 DIAGNOSIS — I130 Hypertensive heart and chronic kidney disease with heart failure and stage 1 through stage 4 chronic kidney disease, or unspecified chronic kidney disease: Secondary | ICD-10-CM | POA: Diagnosis not present

## 2016-08-27 DIAGNOSIS — I350 Nonrheumatic aortic (valve) stenosis: Secondary | ICD-10-CM | POA: Diagnosis not present

## 2016-08-27 DIAGNOSIS — I5032 Chronic diastolic (congestive) heart failure: Secondary | ICD-10-CM | POA: Diagnosis not present

## 2016-08-29 DIAGNOSIS — I482 Chronic atrial fibrillation: Secondary | ICD-10-CM | POA: Diagnosis not present

## 2016-08-29 DIAGNOSIS — L97912 Non-pressure chronic ulcer of unspecified part of right lower leg with fat layer exposed: Secondary | ICD-10-CM | POA: Diagnosis not present

## 2016-08-29 DIAGNOSIS — I350 Nonrheumatic aortic (valve) stenosis: Secondary | ICD-10-CM | POA: Diagnosis not present

## 2016-08-29 DIAGNOSIS — E669 Obesity, unspecified: Secondary | ICD-10-CM | POA: Diagnosis not present

## 2016-08-29 DIAGNOSIS — I130 Hypertensive heart and chronic kidney disease with heart failure and stage 1 through stage 4 chronic kidney disease, or unspecified chronic kidney disease: Secondary | ICD-10-CM | POA: Diagnosis not present

## 2016-08-29 DIAGNOSIS — I5032 Chronic diastolic (congestive) heart failure: Secondary | ICD-10-CM | POA: Diagnosis not present

## 2016-08-31 DIAGNOSIS — I5032 Chronic diastolic (congestive) heart failure: Secondary | ICD-10-CM | POA: Diagnosis not present

## 2016-08-31 DIAGNOSIS — I350 Nonrheumatic aortic (valve) stenosis: Secondary | ICD-10-CM | POA: Diagnosis not present

## 2016-08-31 DIAGNOSIS — I482 Chronic atrial fibrillation: Secondary | ICD-10-CM | POA: Diagnosis not present

## 2016-08-31 DIAGNOSIS — E669 Obesity, unspecified: Secondary | ICD-10-CM | POA: Diagnosis not present

## 2016-08-31 DIAGNOSIS — L97912 Non-pressure chronic ulcer of unspecified part of right lower leg with fat layer exposed: Secondary | ICD-10-CM | POA: Diagnosis not present

## 2016-08-31 DIAGNOSIS — I130 Hypertensive heart and chronic kidney disease with heart failure and stage 1 through stage 4 chronic kidney disease, or unspecified chronic kidney disease: Secondary | ICD-10-CM | POA: Diagnosis not present

## 2016-09-03 DIAGNOSIS — E669 Obesity, unspecified: Secondary | ICD-10-CM | POA: Diagnosis not present

## 2016-09-03 DIAGNOSIS — I5032 Chronic diastolic (congestive) heart failure: Secondary | ICD-10-CM | POA: Diagnosis not present

## 2016-09-03 DIAGNOSIS — L97912 Non-pressure chronic ulcer of unspecified part of right lower leg with fat layer exposed: Secondary | ICD-10-CM | POA: Diagnosis not present

## 2016-09-03 DIAGNOSIS — I130 Hypertensive heart and chronic kidney disease with heart failure and stage 1 through stage 4 chronic kidney disease, or unspecified chronic kidney disease: Secondary | ICD-10-CM | POA: Diagnosis not present

## 2016-09-03 DIAGNOSIS — I482 Chronic atrial fibrillation: Secondary | ICD-10-CM | POA: Diagnosis not present

## 2016-09-03 DIAGNOSIS — I350 Nonrheumatic aortic (valve) stenosis: Secondary | ICD-10-CM | POA: Diagnosis not present

## 2016-09-05 DIAGNOSIS — I130 Hypertensive heart and chronic kidney disease with heart failure and stage 1 through stage 4 chronic kidney disease, or unspecified chronic kidney disease: Secondary | ICD-10-CM | POA: Diagnosis not present

## 2016-09-05 DIAGNOSIS — I350 Nonrheumatic aortic (valve) stenosis: Secondary | ICD-10-CM | POA: Diagnosis not present

## 2016-09-05 DIAGNOSIS — I482 Chronic atrial fibrillation: Secondary | ICD-10-CM | POA: Diagnosis not present

## 2016-09-05 DIAGNOSIS — E669 Obesity, unspecified: Secondary | ICD-10-CM | POA: Diagnosis not present

## 2016-09-05 DIAGNOSIS — L97912 Non-pressure chronic ulcer of unspecified part of right lower leg with fat layer exposed: Secondary | ICD-10-CM | POA: Diagnosis not present

## 2016-09-05 DIAGNOSIS — I5032 Chronic diastolic (congestive) heart failure: Secondary | ICD-10-CM | POA: Diagnosis not present

## 2016-09-07 DIAGNOSIS — L97912 Non-pressure chronic ulcer of unspecified part of right lower leg with fat layer exposed: Secondary | ICD-10-CM | POA: Diagnosis not present

## 2016-09-07 DIAGNOSIS — I130 Hypertensive heart and chronic kidney disease with heart failure and stage 1 through stage 4 chronic kidney disease, or unspecified chronic kidney disease: Secondary | ICD-10-CM | POA: Diagnosis not present

## 2016-09-07 DIAGNOSIS — I350 Nonrheumatic aortic (valve) stenosis: Secondary | ICD-10-CM | POA: Diagnosis not present

## 2016-09-07 DIAGNOSIS — I482 Chronic atrial fibrillation: Secondary | ICD-10-CM | POA: Diagnosis not present

## 2016-09-07 DIAGNOSIS — E669 Obesity, unspecified: Secondary | ICD-10-CM | POA: Diagnosis not present

## 2016-09-07 DIAGNOSIS — I5032 Chronic diastolic (congestive) heart failure: Secondary | ICD-10-CM | POA: Diagnosis not present

## 2016-09-10 DIAGNOSIS — I5032 Chronic diastolic (congestive) heart failure: Secondary | ICD-10-CM | POA: Diagnosis not present

## 2016-09-10 DIAGNOSIS — I482 Chronic atrial fibrillation: Secondary | ICD-10-CM | POA: Diagnosis not present

## 2016-09-10 DIAGNOSIS — I130 Hypertensive heart and chronic kidney disease with heart failure and stage 1 through stage 4 chronic kidney disease, or unspecified chronic kidney disease: Secondary | ICD-10-CM | POA: Diagnosis not present

## 2016-09-10 DIAGNOSIS — L97912 Non-pressure chronic ulcer of unspecified part of right lower leg with fat layer exposed: Secondary | ICD-10-CM | POA: Diagnosis not present

## 2016-09-10 DIAGNOSIS — I350 Nonrheumatic aortic (valve) stenosis: Secondary | ICD-10-CM | POA: Diagnosis not present

## 2016-09-10 DIAGNOSIS — E669 Obesity, unspecified: Secondary | ICD-10-CM | POA: Diagnosis not present

## 2016-09-12 DIAGNOSIS — H353223 Exudative age-related macular degeneration, left eye, with inactive scar: Secondary | ICD-10-CM | POA: Diagnosis not present

## 2016-09-12 DIAGNOSIS — I482 Chronic atrial fibrillation: Secondary | ICD-10-CM | POA: Diagnosis not present

## 2016-09-12 DIAGNOSIS — I130 Hypertensive heart and chronic kidney disease with heart failure and stage 1 through stage 4 chronic kidney disease, or unspecified chronic kidney disease: Secondary | ICD-10-CM | POA: Diagnosis not present

## 2016-09-12 DIAGNOSIS — H43813 Vitreous degeneration, bilateral: Secondary | ICD-10-CM | POA: Diagnosis not present

## 2016-09-12 DIAGNOSIS — E669 Obesity, unspecified: Secondary | ICD-10-CM | POA: Diagnosis not present

## 2016-09-12 DIAGNOSIS — D649 Anemia, unspecified: Secondary | ICD-10-CM | POA: Diagnosis not present

## 2016-09-12 DIAGNOSIS — K5792 Diverticulitis of intestine, part unspecified, without perforation or abscess without bleeding: Secondary | ICD-10-CM | POA: Diagnosis not present

## 2016-09-12 DIAGNOSIS — L97912 Non-pressure chronic ulcer of unspecified part of right lower leg with fat layer exposed: Secondary | ICD-10-CM | POA: Diagnosis not present

## 2016-09-12 DIAGNOSIS — H353211 Exudative age-related macular degeneration, right eye, with active choroidal neovascularization: Secondary | ICD-10-CM | POA: Diagnosis not present

## 2016-09-12 DIAGNOSIS — N183 Chronic kidney disease, stage 3 (moderate): Secondary | ICD-10-CM | POA: Diagnosis not present

## 2016-09-12 DIAGNOSIS — I5032 Chronic diastolic (congestive) heart failure: Secondary | ICD-10-CM | POA: Diagnosis not present

## 2016-09-12 DIAGNOSIS — I350 Nonrheumatic aortic (valve) stenosis: Secondary | ICD-10-CM | POA: Diagnosis not present

## 2016-09-12 DIAGNOSIS — I25119 Atherosclerotic heart disease of native coronary artery with unspecified angina pectoris: Secondary | ICD-10-CM | POA: Diagnosis not present

## 2016-09-14 DIAGNOSIS — I5032 Chronic diastolic (congestive) heart failure: Secondary | ICD-10-CM | POA: Diagnosis not present

## 2016-09-14 DIAGNOSIS — I350 Nonrheumatic aortic (valve) stenosis: Secondary | ICD-10-CM | POA: Diagnosis not present

## 2016-09-14 DIAGNOSIS — E669 Obesity, unspecified: Secondary | ICD-10-CM | POA: Diagnosis not present

## 2016-09-14 DIAGNOSIS — I130 Hypertensive heart and chronic kidney disease with heart failure and stage 1 through stage 4 chronic kidney disease, or unspecified chronic kidney disease: Secondary | ICD-10-CM | POA: Diagnosis not present

## 2016-09-14 DIAGNOSIS — L97912 Non-pressure chronic ulcer of unspecified part of right lower leg with fat layer exposed: Secondary | ICD-10-CM | POA: Diagnosis not present

## 2016-09-14 DIAGNOSIS — I482 Chronic atrial fibrillation: Secondary | ICD-10-CM | POA: Diagnosis not present

## 2016-09-17 DIAGNOSIS — I5032 Chronic diastolic (congestive) heart failure: Secondary | ICD-10-CM | POA: Diagnosis not present

## 2016-09-17 DIAGNOSIS — I482 Chronic atrial fibrillation: Secondary | ICD-10-CM | POA: Diagnosis not present

## 2016-09-17 DIAGNOSIS — E669 Obesity, unspecified: Secondary | ICD-10-CM | POA: Diagnosis not present

## 2016-09-17 DIAGNOSIS — I130 Hypertensive heart and chronic kidney disease with heart failure and stage 1 through stage 4 chronic kidney disease, or unspecified chronic kidney disease: Secondary | ICD-10-CM | POA: Diagnosis not present

## 2016-09-17 DIAGNOSIS — I350 Nonrheumatic aortic (valve) stenosis: Secondary | ICD-10-CM | POA: Diagnosis not present

## 2016-09-17 DIAGNOSIS — L97912 Non-pressure chronic ulcer of unspecified part of right lower leg with fat layer exposed: Secondary | ICD-10-CM | POA: Diagnosis not present

## 2016-09-19 DIAGNOSIS — L97912 Non-pressure chronic ulcer of unspecified part of right lower leg with fat layer exposed: Secondary | ICD-10-CM | POA: Diagnosis not present

## 2016-09-19 DIAGNOSIS — I130 Hypertensive heart and chronic kidney disease with heart failure and stage 1 through stage 4 chronic kidney disease, or unspecified chronic kidney disease: Secondary | ICD-10-CM | POA: Diagnosis not present

## 2016-09-19 DIAGNOSIS — I5032 Chronic diastolic (congestive) heart failure: Secondary | ICD-10-CM | POA: Diagnosis not present

## 2016-09-19 DIAGNOSIS — I482 Chronic atrial fibrillation: Secondary | ICD-10-CM | POA: Diagnosis not present

## 2016-09-19 DIAGNOSIS — I350 Nonrheumatic aortic (valve) stenosis: Secondary | ICD-10-CM | POA: Diagnosis not present

## 2016-09-19 DIAGNOSIS — E669 Obesity, unspecified: Secondary | ICD-10-CM | POA: Diagnosis not present

## 2016-09-20 ENCOUNTER — Ambulatory Visit: Payer: Medicare Other | Admitting: Podiatry

## 2016-09-20 ENCOUNTER — Encounter: Payer: Self-pay | Admitting: Podiatry

## 2016-09-20 ENCOUNTER — Ambulatory Visit (INDEPENDENT_AMBULATORY_CARE_PROVIDER_SITE_OTHER): Payer: Medicare Other | Admitting: Podiatry

## 2016-09-20 DIAGNOSIS — M79672 Pain in left foot: Secondary | ICD-10-CM

## 2016-09-20 DIAGNOSIS — B351 Tinea unguium: Secondary | ICD-10-CM

## 2016-09-20 DIAGNOSIS — M79671 Pain in right foot: Secondary | ICD-10-CM | POA: Diagnosis not present

## 2016-09-20 DIAGNOSIS — L600 Ingrowing nail: Secondary | ICD-10-CM

## 2016-09-20 NOTE — Patient Instructions (Signed)
Seen for hypertrophic and painful nails. All nails and ingrown nails debrided. Return in 3 months or as needed.

## 2016-09-20 NOTE — Progress Notes (Signed)
SUBJECTIVE: 82 y.o. year old female presents via wheel chair requesting painful toe nails trimmed.  Both big toes are painful. Having hard of hearing.   OBJECTIVE: DERMATOLOGIC EXAMINATION: Painful ingrown nails both great toes without open infection or drainage. Hypertrophic nails x 10. VASCULAR EXAMINATION OF LOWER LIMBS: Pedal pulses: All pedal pulses are palpable with normal pulsation. Minimum edema noted. Temperature gradient from tibial crest to dorsum of foot is within normal bilateral. NEUROLOGIC EXAMINATION OF THE LOWER LIMBS: All epicritic and tactile sensations grossly intact. MUSCULOSKELETAL EXAMINATION: Mild digital contracture lesser digits bilateral.  ASSESSMENT: Onychomycosis x 10. Ingrown nail both great toes. Pain in lower limb.  Plan: All nails debrided. Pain was relieved. Return in 3 months

## 2016-09-21 DIAGNOSIS — E669 Obesity, unspecified: Secondary | ICD-10-CM | POA: Diagnosis not present

## 2016-09-21 DIAGNOSIS — I5032 Chronic diastolic (congestive) heart failure: Secondary | ICD-10-CM | POA: Diagnosis not present

## 2016-09-21 DIAGNOSIS — I130 Hypertensive heart and chronic kidney disease with heart failure and stage 1 through stage 4 chronic kidney disease, or unspecified chronic kidney disease: Secondary | ICD-10-CM | POA: Diagnosis not present

## 2016-09-21 DIAGNOSIS — I482 Chronic atrial fibrillation: Secondary | ICD-10-CM | POA: Diagnosis not present

## 2016-09-21 DIAGNOSIS — L97912 Non-pressure chronic ulcer of unspecified part of right lower leg with fat layer exposed: Secondary | ICD-10-CM | POA: Diagnosis not present

## 2016-09-21 DIAGNOSIS — I350 Nonrheumatic aortic (valve) stenosis: Secondary | ICD-10-CM | POA: Diagnosis not present

## 2016-09-23 DIAGNOSIS — I5032 Chronic diastolic (congestive) heart failure: Secondary | ICD-10-CM | POA: Diagnosis not present

## 2016-09-23 DIAGNOSIS — I350 Nonrheumatic aortic (valve) stenosis: Secondary | ICD-10-CM | POA: Diagnosis not present

## 2016-09-23 DIAGNOSIS — L97912 Non-pressure chronic ulcer of unspecified part of right lower leg with fat layer exposed: Secondary | ICD-10-CM | POA: Diagnosis not present

## 2016-09-23 DIAGNOSIS — I130 Hypertensive heart and chronic kidney disease with heart failure and stage 1 through stage 4 chronic kidney disease, or unspecified chronic kidney disease: Secondary | ICD-10-CM | POA: Diagnosis not present

## 2016-09-23 DIAGNOSIS — I482 Chronic atrial fibrillation: Secondary | ICD-10-CM | POA: Diagnosis not present

## 2016-09-23 DIAGNOSIS — E669 Obesity, unspecified: Secondary | ICD-10-CM | POA: Diagnosis not present

## 2016-09-24 DIAGNOSIS — E669 Obesity, unspecified: Secondary | ICD-10-CM | POA: Diagnosis not present

## 2016-09-24 DIAGNOSIS — I5032 Chronic diastolic (congestive) heart failure: Secondary | ICD-10-CM | POA: Diagnosis not present

## 2016-09-24 DIAGNOSIS — I130 Hypertensive heart and chronic kidney disease with heart failure and stage 1 through stage 4 chronic kidney disease, or unspecified chronic kidney disease: Secondary | ICD-10-CM | POA: Diagnosis not present

## 2016-09-24 DIAGNOSIS — L97912 Non-pressure chronic ulcer of unspecified part of right lower leg with fat layer exposed: Secondary | ICD-10-CM | POA: Diagnosis not present

## 2016-09-24 DIAGNOSIS — I482 Chronic atrial fibrillation: Secondary | ICD-10-CM | POA: Diagnosis not present

## 2016-09-24 DIAGNOSIS — I350 Nonrheumatic aortic (valve) stenosis: Secondary | ICD-10-CM | POA: Diagnosis not present

## 2016-09-26 DIAGNOSIS — M6281 Muscle weakness (generalized): Secondary | ICD-10-CM | POA: Diagnosis not present

## 2016-09-26 DIAGNOSIS — R262 Difficulty in walking, not elsewhere classified: Secondary | ICD-10-CM | POA: Diagnosis not present

## 2016-09-26 DIAGNOSIS — M545 Low back pain: Secondary | ICD-10-CM | POA: Diagnosis not present

## 2016-09-26 DIAGNOSIS — E669 Obesity, unspecified: Secondary | ICD-10-CM | POA: Diagnosis not present

## 2016-09-26 DIAGNOSIS — I130 Hypertensive heart and chronic kidney disease with heart failure and stage 1 through stage 4 chronic kidney disease, or unspecified chronic kidney disease: Secondary | ICD-10-CM | POA: Diagnosis not present

## 2016-09-26 DIAGNOSIS — I482 Chronic atrial fibrillation: Secondary | ICD-10-CM | POA: Diagnosis not present

## 2016-09-26 DIAGNOSIS — L97912 Non-pressure chronic ulcer of unspecified part of right lower leg with fat layer exposed: Secondary | ICD-10-CM | POA: Diagnosis not present

## 2016-09-26 DIAGNOSIS — I5032 Chronic diastolic (congestive) heart failure: Secondary | ICD-10-CM | POA: Diagnosis not present

## 2016-09-26 DIAGNOSIS — I350 Nonrheumatic aortic (valve) stenosis: Secondary | ICD-10-CM | POA: Diagnosis not present

## 2016-09-26 DIAGNOSIS — M25551 Pain in right hip: Secondary | ICD-10-CM | POA: Diagnosis not present

## 2016-09-27 DIAGNOSIS — I350 Nonrheumatic aortic (valve) stenosis: Secondary | ICD-10-CM | POA: Diagnosis not present

## 2016-09-27 DIAGNOSIS — E669 Obesity, unspecified: Secondary | ICD-10-CM | POA: Diagnosis not present

## 2016-09-27 DIAGNOSIS — L97912 Non-pressure chronic ulcer of unspecified part of right lower leg with fat layer exposed: Secondary | ICD-10-CM | POA: Diagnosis not present

## 2016-09-27 DIAGNOSIS — I5032 Chronic diastolic (congestive) heart failure: Secondary | ICD-10-CM | POA: Diagnosis not present

## 2016-09-27 DIAGNOSIS — I482 Chronic atrial fibrillation: Secondary | ICD-10-CM | POA: Diagnosis not present

## 2016-09-27 DIAGNOSIS — I130 Hypertensive heart and chronic kidney disease with heart failure and stage 1 through stage 4 chronic kidney disease, or unspecified chronic kidney disease: Secondary | ICD-10-CM | POA: Diagnosis not present

## 2016-09-28 DIAGNOSIS — E669 Obesity, unspecified: Secondary | ICD-10-CM | POA: Diagnosis not present

## 2016-09-28 DIAGNOSIS — I5032 Chronic diastolic (congestive) heart failure: Secondary | ICD-10-CM | POA: Diagnosis not present

## 2016-09-28 DIAGNOSIS — I130 Hypertensive heart and chronic kidney disease with heart failure and stage 1 through stage 4 chronic kidney disease, or unspecified chronic kidney disease: Secondary | ICD-10-CM | POA: Diagnosis not present

## 2016-09-28 DIAGNOSIS — I482 Chronic atrial fibrillation: Secondary | ICD-10-CM | POA: Diagnosis not present

## 2016-09-28 DIAGNOSIS — L97912 Non-pressure chronic ulcer of unspecified part of right lower leg with fat layer exposed: Secondary | ICD-10-CM | POA: Diagnosis not present

## 2016-09-28 DIAGNOSIS — I350 Nonrheumatic aortic (valve) stenosis: Secondary | ICD-10-CM | POA: Diagnosis not present

## 2016-09-30 DIAGNOSIS — I482 Chronic atrial fibrillation: Secondary | ICD-10-CM | POA: Diagnosis not present

## 2016-09-30 DIAGNOSIS — I130 Hypertensive heart and chronic kidney disease with heart failure and stage 1 through stage 4 chronic kidney disease, or unspecified chronic kidney disease: Secondary | ICD-10-CM | POA: Diagnosis not present

## 2016-09-30 DIAGNOSIS — I5032 Chronic diastolic (congestive) heart failure: Secondary | ICD-10-CM | POA: Diagnosis not present

## 2016-09-30 DIAGNOSIS — I350 Nonrheumatic aortic (valve) stenosis: Secondary | ICD-10-CM | POA: Diagnosis not present

## 2016-09-30 DIAGNOSIS — L97912 Non-pressure chronic ulcer of unspecified part of right lower leg with fat layer exposed: Secondary | ICD-10-CM | POA: Diagnosis not present

## 2016-09-30 DIAGNOSIS — E669 Obesity, unspecified: Secondary | ICD-10-CM | POA: Diagnosis not present

## 2016-10-01 DIAGNOSIS — I482 Chronic atrial fibrillation: Secondary | ICD-10-CM | POA: Diagnosis not present

## 2016-10-01 DIAGNOSIS — E669 Obesity, unspecified: Secondary | ICD-10-CM | POA: Diagnosis not present

## 2016-10-01 DIAGNOSIS — L97912 Non-pressure chronic ulcer of unspecified part of right lower leg with fat layer exposed: Secondary | ICD-10-CM | POA: Diagnosis not present

## 2016-10-01 DIAGNOSIS — I130 Hypertensive heart and chronic kidney disease with heart failure and stage 1 through stage 4 chronic kidney disease, or unspecified chronic kidney disease: Secondary | ICD-10-CM | POA: Diagnosis not present

## 2016-10-01 DIAGNOSIS — I5032 Chronic diastolic (congestive) heart failure: Secondary | ICD-10-CM | POA: Diagnosis not present

## 2016-10-01 DIAGNOSIS — I350 Nonrheumatic aortic (valve) stenosis: Secondary | ICD-10-CM | POA: Diagnosis not present

## 2016-10-03 DIAGNOSIS — L97912 Non-pressure chronic ulcer of unspecified part of right lower leg with fat layer exposed: Secondary | ICD-10-CM | POA: Diagnosis not present

## 2016-10-03 DIAGNOSIS — E669 Obesity, unspecified: Secondary | ICD-10-CM | POA: Diagnosis not present

## 2016-10-03 DIAGNOSIS — I350 Nonrheumatic aortic (valve) stenosis: Secondary | ICD-10-CM | POA: Diagnosis not present

## 2016-10-03 DIAGNOSIS — I130 Hypertensive heart and chronic kidney disease with heart failure and stage 1 through stage 4 chronic kidney disease, or unspecified chronic kidney disease: Secondary | ICD-10-CM | POA: Diagnosis not present

## 2016-10-03 DIAGNOSIS — I5032 Chronic diastolic (congestive) heart failure: Secondary | ICD-10-CM | POA: Diagnosis not present

## 2016-10-03 DIAGNOSIS — I482 Chronic atrial fibrillation: Secondary | ICD-10-CM | POA: Diagnosis not present

## 2016-10-05 DIAGNOSIS — I350 Nonrheumatic aortic (valve) stenosis: Secondary | ICD-10-CM | POA: Diagnosis not present

## 2016-10-05 DIAGNOSIS — I5032 Chronic diastolic (congestive) heart failure: Secondary | ICD-10-CM | POA: Diagnosis not present

## 2016-10-05 DIAGNOSIS — E669 Obesity, unspecified: Secondary | ICD-10-CM | POA: Diagnosis not present

## 2016-10-05 DIAGNOSIS — I130 Hypertensive heart and chronic kidney disease with heart failure and stage 1 through stage 4 chronic kidney disease, or unspecified chronic kidney disease: Secondary | ICD-10-CM | POA: Diagnosis not present

## 2016-10-05 DIAGNOSIS — I482 Chronic atrial fibrillation: Secondary | ICD-10-CM | POA: Diagnosis not present

## 2016-10-05 DIAGNOSIS — L97912 Non-pressure chronic ulcer of unspecified part of right lower leg with fat layer exposed: Secondary | ICD-10-CM | POA: Diagnosis not present

## 2016-10-07 DIAGNOSIS — L97912 Non-pressure chronic ulcer of unspecified part of right lower leg with fat layer exposed: Secondary | ICD-10-CM | POA: Diagnosis not present

## 2016-10-07 DIAGNOSIS — I5032 Chronic diastolic (congestive) heart failure: Secondary | ICD-10-CM | POA: Diagnosis not present

## 2016-10-07 DIAGNOSIS — E669 Obesity, unspecified: Secondary | ICD-10-CM | POA: Diagnosis not present

## 2016-10-07 DIAGNOSIS — I130 Hypertensive heart and chronic kidney disease with heart failure and stage 1 through stage 4 chronic kidney disease, or unspecified chronic kidney disease: Secondary | ICD-10-CM | POA: Diagnosis not present

## 2016-10-07 DIAGNOSIS — I350 Nonrheumatic aortic (valve) stenosis: Secondary | ICD-10-CM | POA: Diagnosis not present

## 2016-10-07 DIAGNOSIS — I482 Chronic atrial fibrillation: Secondary | ICD-10-CM | POA: Diagnosis not present

## 2016-10-08 DIAGNOSIS — I130 Hypertensive heart and chronic kidney disease with heart failure and stage 1 through stage 4 chronic kidney disease, or unspecified chronic kidney disease: Secondary | ICD-10-CM | POA: Diagnosis not present

## 2016-10-08 DIAGNOSIS — I482 Chronic atrial fibrillation: Secondary | ICD-10-CM | POA: Diagnosis not present

## 2016-10-08 DIAGNOSIS — L97912 Non-pressure chronic ulcer of unspecified part of right lower leg with fat layer exposed: Secondary | ICD-10-CM | POA: Diagnosis not present

## 2016-10-08 DIAGNOSIS — I5032 Chronic diastolic (congestive) heart failure: Secondary | ICD-10-CM | POA: Diagnosis not present

## 2016-10-08 DIAGNOSIS — E669 Obesity, unspecified: Secondary | ICD-10-CM | POA: Diagnosis not present

## 2016-10-08 DIAGNOSIS — I350 Nonrheumatic aortic (valve) stenosis: Secondary | ICD-10-CM | POA: Diagnosis not present

## 2016-10-10 DIAGNOSIS — L97912 Non-pressure chronic ulcer of unspecified part of right lower leg with fat layer exposed: Secondary | ICD-10-CM | POA: Diagnosis not present

## 2016-10-10 DIAGNOSIS — I482 Chronic atrial fibrillation: Secondary | ICD-10-CM | POA: Diagnosis not present

## 2016-10-10 DIAGNOSIS — E669 Obesity, unspecified: Secondary | ICD-10-CM | POA: Diagnosis not present

## 2016-10-10 DIAGNOSIS — I130 Hypertensive heart and chronic kidney disease with heart failure and stage 1 through stage 4 chronic kidney disease, or unspecified chronic kidney disease: Secondary | ICD-10-CM | POA: Diagnosis not present

## 2016-10-10 DIAGNOSIS — I350 Nonrheumatic aortic (valve) stenosis: Secondary | ICD-10-CM | POA: Diagnosis not present

## 2016-10-10 DIAGNOSIS — I5032 Chronic diastolic (congestive) heart failure: Secondary | ICD-10-CM | POA: Diagnosis not present

## 2016-10-11 DIAGNOSIS — I130 Hypertensive heart and chronic kidney disease with heart failure and stage 1 through stage 4 chronic kidney disease, or unspecified chronic kidney disease: Secondary | ICD-10-CM | POA: Diagnosis not present

## 2016-10-11 DIAGNOSIS — L97912 Non-pressure chronic ulcer of unspecified part of right lower leg with fat layer exposed: Secondary | ICD-10-CM | POA: Diagnosis not present

## 2016-10-11 DIAGNOSIS — I350 Nonrheumatic aortic (valve) stenosis: Secondary | ICD-10-CM | POA: Diagnosis not present

## 2016-10-11 DIAGNOSIS — I482 Chronic atrial fibrillation: Secondary | ICD-10-CM | POA: Diagnosis not present

## 2016-10-11 DIAGNOSIS — E669 Obesity, unspecified: Secondary | ICD-10-CM | POA: Diagnosis not present

## 2016-10-11 DIAGNOSIS — I5032 Chronic diastolic (congestive) heart failure: Secondary | ICD-10-CM | POA: Diagnosis not present

## 2016-10-12 DIAGNOSIS — I5032 Chronic diastolic (congestive) heart failure: Secondary | ICD-10-CM | POA: Diagnosis not present

## 2016-10-12 DIAGNOSIS — I130 Hypertensive heart and chronic kidney disease with heart failure and stage 1 through stage 4 chronic kidney disease, or unspecified chronic kidney disease: Secondary | ICD-10-CM | POA: Diagnosis not present

## 2016-10-12 DIAGNOSIS — K5790 Diverticulosis of intestine, part unspecified, without perforation or abscess without bleeding: Secondary | ICD-10-CM | POA: Diagnosis not present

## 2016-10-12 DIAGNOSIS — I350 Nonrheumatic aortic (valve) stenosis: Secondary | ICD-10-CM | POA: Diagnosis not present

## 2016-10-12 DIAGNOSIS — N183 Chronic kidney disease, stage 3 (moderate): Secondary | ICD-10-CM | POA: Diagnosis not present

## 2016-10-12 DIAGNOSIS — L97912 Non-pressure chronic ulcer of unspecified part of right lower leg with fat layer exposed: Secondary | ICD-10-CM | POA: Diagnosis not present

## 2016-10-12 DIAGNOSIS — E669 Obesity, unspecified: Secondary | ICD-10-CM | POA: Diagnosis not present

## 2016-10-12 DIAGNOSIS — I482 Chronic atrial fibrillation: Secondary | ICD-10-CM | POA: Diagnosis not present

## 2016-10-12 DIAGNOSIS — D649 Anemia, unspecified: Secondary | ICD-10-CM | POA: Diagnosis not present

## 2016-10-12 DIAGNOSIS — I25119 Atherosclerotic heart disease of native coronary artery with unspecified angina pectoris: Secondary | ICD-10-CM | POA: Diagnosis not present

## 2016-10-12 DIAGNOSIS — K5792 Diverticulitis of intestine, part unspecified, without perforation or abscess without bleeding: Secondary | ICD-10-CM | POA: Diagnosis not present

## 2016-10-15 DIAGNOSIS — L97912 Non-pressure chronic ulcer of unspecified part of right lower leg with fat layer exposed: Secondary | ICD-10-CM | POA: Diagnosis not present

## 2016-10-15 DIAGNOSIS — N183 Chronic kidney disease, stage 3 (moderate): Secondary | ICD-10-CM | POA: Diagnosis not present

## 2016-10-15 DIAGNOSIS — I482 Chronic atrial fibrillation: Secondary | ICD-10-CM | POA: Diagnosis not present

## 2016-10-15 DIAGNOSIS — I5032 Chronic diastolic (congestive) heart failure: Secondary | ICD-10-CM | POA: Diagnosis not present

## 2016-10-15 DIAGNOSIS — I130 Hypertensive heart and chronic kidney disease with heart failure and stage 1 through stage 4 chronic kidney disease, or unspecified chronic kidney disease: Secondary | ICD-10-CM | POA: Diagnosis not present

## 2016-10-15 DIAGNOSIS — I350 Nonrheumatic aortic (valve) stenosis: Secondary | ICD-10-CM | POA: Diagnosis not present

## 2016-10-17 DIAGNOSIS — I482 Chronic atrial fibrillation: Secondary | ICD-10-CM | POA: Diagnosis not present

## 2016-10-17 DIAGNOSIS — I130 Hypertensive heart and chronic kidney disease with heart failure and stage 1 through stage 4 chronic kidney disease, or unspecified chronic kidney disease: Secondary | ICD-10-CM | POA: Diagnosis not present

## 2016-10-17 DIAGNOSIS — I350 Nonrheumatic aortic (valve) stenosis: Secondary | ICD-10-CM | POA: Diagnosis not present

## 2016-10-17 DIAGNOSIS — I5032 Chronic diastolic (congestive) heart failure: Secondary | ICD-10-CM | POA: Diagnosis not present

## 2016-10-17 DIAGNOSIS — L97912 Non-pressure chronic ulcer of unspecified part of right lower leg with fat layer exposed: Secondary | ICD-10-CM | POA: Diagnosis not present

## 2016-10-17 DIAGNOSIS — N183 Chronic kidney disease, stage 3 (moderate): Secondary | ICD-10-CM | POA: Diagnosis not present

## 2016-10-19 DIAGNOSIS — N183 Chronic kidney disease, stage 3 (moderate): Secondary | ICD-10-CM | POA: Diagnosis not present

## 2016-10-19 DIAGNOSIS — I350 Nonrheumatic aortic (valve) stenosis: Secondary | ICD-10-CM | POA: Diagnosis not present

## 2016-10-19 DIAGNOSIS — L97912 Non-pressure chronic ulcer of unspecified part of right lower leg with fat layer exposed: Secondary | ICD-10-CM | POA: Diagnosis not present

## 2016-10-19 DIAGNOSIS — I5032 Chronic diastolic (congestive) heart failure: Secondary | ICD-10-CM | POA: Diagnosis not present

## 2016-10-19 DIAGNOSIS — I130 Hypertensive heart and chronic kidney disease with heart failure and stage 1 through stage 4 chronic kidney disease, or unspecified chronic kidney disease: Secondary | ICD-10-CM | POA: Diagnosis not present

## 2016-10-19 DIAGNOSIS — I482 Chronic atrial fibrillation: Secondary | ICD-10-CM | POA: Diagnosis not present

## 2016-10-22 DIAGNOSIS — L97912 Non-pressure chronic ulcer of unspecified part of right lower leg with fat layer exposed: Secondary | ICD-10-CM | POA: Diagnosis not present

## 2016-10-22 DIAGNOSIS — I350 Nonrheumatic aortic (valve) stenosis: Secondary | ICD-10-CM | POA: Diagnosis not present

## 2016-10-22 DIAGNOSIS — N183 Chronic kidney disease, stage 3 (moderate): Secondary | ICD-10-CM | POA: Diagnosis not present

## 2016-10-22 DIAGNOSIS — I5032 Chronic diastolic (congestive) heart failure: Secondary | ICD-10-CM | POA: Diagnosis not present

## 2016-10-22 DIAGNOSIS — I130 Hypertensive heart and chronic kidney disease with heart failure and stage 1 through stage 4 chronic kidney disease, or unspecified chronic kidney disease: Secondary | ICD-10-CM | POA: Diagnosis not present

## 2016-10-22 DIAGNOSIS — I482 Chronic atrial fibrillation: Secondary | ICD-10-CM | POA: Diagnosis not present

## 2016-10-24 DIAGNOSIS — N183 Chronic kidney disease, stage 3 (moderate): Secondary | ICD-10-CM | POA: Diagnosis not present

## 2016-10-24 DIAGNOSIS — I5032 Chronic diastolic (congestive) heart failure: Secondary | ICD-10-CM | POA: Diagnosis not present

## 2016-10-24 DIAGNOSIS — I130 Hypertensive heart and chronic kidney disease with heart failure and stage 1 through stage 4 chronic kidney disease, or unspecified chronic kidney disease: Secondary | ICD-10-CM | POA: Diagnosis not present

## 2016-10-24 DIAGNOSIS — L97912 Non-pressure chronic ulcer of unspecified part of right lower leg with fat layer exposed: Secondary | ICD-10-CM | POA: Diagnosis not present

## 2016-10-24 DIAGNOSIS — I482 Chronic atrial fibrillation: Secondary | ICD-10-CM | POA: Diagnosis not present

## 2016-10-24 DIAGNOSIS — I350 Nonrheumatic aortic (valve) stenosis: Secondary | ICD-10-CM | POA: Diagnosis not present

## 2016-10-26 DIAGNOSIS — I5032 Chronic diastolic (congestive) heart failure: Secondary | ICD-10-CM | POA: Diagnosis not present

## 2016-10-26 DIAGNOSIS — I130 Hypertensive heart and chronic kidney disease with heart failure and stage 1 through stage 4 chronic kidney disease, or unspecified chronic kidney disease: Secondary | ICD-10-CM | POA: Diagnosis not present

## 2016-10-26 DIAGNOSIS — L97912 Non-pressure chronic ulcer of unspecified part of right lower leg with fat layer exposed: Secondary | ICD-10-CM | POA: Diagnosis not present

## 2016-10-26 DIAGNOSIS — I482 Chronic atrial fibrillation: Secondary | ICD-10-CM | POA: Diagnosis not present

## 2016-10-26 DIAGNOSIS — I350 Nonrheumatic aortic (valve) stenosis: Secondary | ICD-10-CM | POA: Diagnosis not present

## 2016-10-26 DIAGNOSIS — N183 Chronic kidney disease, stage 3 (moderate): Secondary | ICD-10-CM | POA: Diagnosis not present

## 2016-10-29 DIAGNOSIS — N183 Chronic kidney disease, stage 3 (moderate): Secondary | ICD-10-CM | POA: Diagnosis not present

## 2016-10-29 DIAGNOSIS — I130 Hypertensive heart and chronic kidney disease with heart failure and stage 1 through stage 4 chronic kidney disease, or unspecified chronic kidney disease: Secondary | ICD-10-CM | POA: Diagnosis not present

## 2016-10-29 DIAGNOSIS — I482 Chronic atrial fibrillation: Secondary | ICD-10-CM | POA: Diagnosis not present

## 2016-10-29 DIAGNOSIS — I5032 Chronic diastolic (congestive) heart failure: Secondary | ICD-10-CM | POA: Diagnosis not present

## 2016-10-29 DIAGNOSIS — L97912 Non-pressure chronic ulcer of unspecified part of right lower leg with fat layer exposed: Secondary | ICD-10-CM | POA: Diagnosis not present

## 2016-10-29 DIAGNOSIS — I350 Nonrheumatic aortic (valve) stenosis: Secondary | ICD-10-CM | POA: Diagnosis not present

## 2016-10-31 DIAGNOSIS — I482 Chronic atrial fibrillation: Secondary | ICD-10-CM | POA: Diagnosis not present

## 2016-10-31 DIAGNOSIS — I350 Nonrheumatic aortic (valve) stenosis: Secondary | ICD-10-CM | POA: Diagnosis not present

## 2016-10-31 DIAGNOSIS — N183 Chronic kidney disease, stage 3 (moderate): Secondary | ICD-10-CM | POA: Diagnosis not present

## 2016-10-31 DIAGNOSIS — I5032 Chronic diastolic (congestive) heart failure: Secondary | ICD-10-CM | POA: Diagnosis not present

## 2016-10-31 DIAGNOSIS — L97912 Non-pressure chronic ulcer of unspecified part of right lower leg with fat layer exposed: Secondary | ICD-10-CM | POA: Diagnosis not present

## 2016-10-31 DIAGNOSIS — I130 Hypertensive heart and chronic kidney disease with heart failure and stage 1 through stage 4 chronic kidney disease, or unspecified chronic kidney disease: Secondary | ICD-10-CM | POA: Diagnosis not present

## 2016-11-02 DIAGNOSIS — N183 Chronic kidney disease, stage 3 (moderate): Secondary | ICD-10-CM | POA: Diagnosis not present

## 2016-11-02 DIAGNOSIS — I130 Hypertensive heart and chronic kidney disease with heart failure and stage 1 through stage 4 chronic kidney disease, or unspecified chronic kidney disease: Secondary | ICD-10-CM | POA: Diagnosis not present

## 2016-11-02 DIAGNOSIS — I482 Chronic atrial fibrillation: Secondary | ICD-10-CM | POA: Diagnosis not present

## 2016-11-02 DIAGNOSIS — L97912 Non-pressure chronic ulcer of unspecified part of right lower leg with fat layer exposed: Secondary | ICD-10-CM | POA: Diagnosis not present

## 2016-11-02 DIAGNOSIS — I5032 Chronic diastolic (congestive) heart failure: Secondary | ICD-10-CM | POA: Diagnosis not present

## 2016-11-02 DIAGNOSIS — I350 Nonrheumatic aortic (valve) stenosis: Secondary | ICD-10-CM | POA: Diagnosis not present

## 2016-11-05 DIAGNOSIS — N183 Chronic kidney disease, stage 3 (moderate): Secondary | ICD-10-CM | POA: Diagnosis not present

## 2016-11-05 DIAGNOSIS — I482 Chronic atrial fibrillation: Secondary | ICD-10-CM | POA: Diagnosis not present

## 2016-11-05 DIAGNOSIS — I5032 Chronic diastolic (congestive) heart failure: Secondary | ICD-10-CM | POA: Diagnosis not present

## 2016-11-05 DIAGNOSIS — I350 Nonrheumatic aortic (valve) stenosis: Secondary | ICD-10-CM | POA: Diagnosis not present

## 2016-11-05 DIAGNOSIS — L97912 Non-pressure chronic ulcer of unspecified part of right lower leg with fat layer exposed: Secondary | ICD-10-CM | POA: Diagnosis not present

## 2016-11-05 DIAGNOSIS — I130 Hypertensive heart and chronic kidney disease with heart failure and stage 1 through stage 4 chronic kidney disease, or unspecified chronic kidney disease: Secondary | ICD-10-CM | POA: Diagnosis not present

## 2016-11-06 DIAGNOSIS — I482 Chronic atrial fibrillation: Secondary | ICD-10-CM | POA: Diagnosis not present

## 2016-11-06 DIAGNOSIS — N183 Chronic kidney disease, stage 3 (moderate): Secondary | ICD-10-CM | POA: Diagnosis not present

## 2016-11-06 DIAGNOSIS — I130 Hypertensive heart and chronic kidney disease with heart failure and stage 1 through stage 4 chronic kidney disease, or unspecified chronic kidney disease: Secondary | ICD-10-CM | POA: Diagnosis not present

## 2016-11-06 DIAGNOSIS — I5032 Chronic diastolic (congestive) heart failure: Secondary | ICD-10-CM | POA: Diagnosis not present

## 2016-11-06 DIAGNOSIS — L97912 Non-pressure chronic ulcer of unspecified part of right lower leg with fat layer exposed: Secondary | ICD-10-CM | POA: Diagnosis not present

## 2016-11-06 DIAGNOSIS — I350 Nonrheumatic aortic (valve) stenosis: Secondary | ICD-10-CM | POA: Diagnosis not present

## 2016-11-07 DIAGNOSIS — I350 Nonrheumatic aortic (valve) stenosis: Secondary | ICD-10-CM | POA: Diagnosis not present

## 2016-11-07 DIAGNOSIS — I482 Chronic atrial fibrillation: Secondary | ICD-10-CM | POA: Diagnosis not present

## 2016-11-07 DIAGNOSIS — I5032 Chronic diastolic (congestive) heart failure: Secondary | ICD-10-CM | POA: Diagnosis not present

## 2016-11-07 DIAGNOSIS — I130 Hypertensive heart and chronic kidney disease with heart failure and stage 1 through stage 4 chronic kidney disease, or unspecified chronic kidney disease: Secondary | ICD-10-CM | POA: Diagnosis not present

## 2016-11-07 DIAGNOSIS — N183 Chronic kidney disease, stage 3 (moderate): Secondary | ICD-10-CM | POA: Diagnosis not present

## 2016-11-07 DIAGNOSIS — L97912 Non-pressure chronic ulcer of unspecified part of right lower leg with fat layer exposed: Secondary | ICD-10-CM | POA: Diagnosis not present

## 2016-11-09 DIAGNOSIS — N183 Chronic kidney disease, stage 3 (moderate): Secondary | ICD-10-CM | POA: Diagnosis not present

## 2016-11-09 DIAGNOSIS — I5032 Chronic diastolic (congestive) heart failure: Secondary | ICD-10-CM | POA: Diagnosis not present

## 2016-11-09 DIAGNOSIS — I482 Chronic atrial fibrillation: Secondary | ICD-10-CM | POA: Diagnosis not present

## 2016-11-09 DIAGNOSIS — I350 Nonrheumatic aortic (valve) stenosis: Secondary | ICD-10-CM | POA: Diagnosis not present

## 2016-11-09 DIAGNOSIS — L97912 Non-pressure chronic ulcer of unspecified part of right lower leg with fat layer exposed: Secondary | ICD-10-CM | POA: Diagnosis not present

## 2016-11-09 DIAGNOSIS — I130 Hypertensive heart and chronic kidney disease with heart failure and stage 1 through stage 4 chronic kidney disease, or unspecified chronic kidney disease: Secondary | ICD-10-CM | POA: Diagnosis not present

## 2016-11-12 DIAGNOSIS — I25119 Atherosclerotic heart disease of native coronary artery with unspecified angina pectoris: Secondary | ICD-10-CM | POA: Diagnosis not present

## 2016-11-12 DIAGNOSIS — D649 Anemia, unspecified: Secondary | ICD-10-CM | POA: Diagnosis not present

## 2016-11-12 DIAGNOSIS — L97912 Non-pressure chronic ulcer of unspecified part of right lower leg with fat layer exposed: Secondary | ICD-10-CM | POA: Diagnosis not present

## 2016-11-12 DIAGNOSIS — I5032 Chronic diastolic (congestive) heart failure: Secondary | ICD-10-CM | POA: Diagnosis not present

## 2016-11-12 DIAGNOSIS — N183 Chronic kidney disease, stage 3 (moderate): Secondary | ICD-10-CM | POA: Diagnosis not present

## 2016-11-12 DIAGNOSIS — E669 Obesity, unspecified: Secondary | ICD-10-CM | POA: Diagnosis not present

## 2016-11-12 DIAGNOSIS — I350 Nonrheumatic aortic (valve) stenosis: Secondary | ICD-10-CM | POA: Diagnosis not present

## 2016-11-12 DIAGNOSIS — I130 Hypertensive heart and chronic kidney disease with heart failure and stage 1 through stage 4 chronic kidney disease, or unspecified chronic kidney disease: Secondary | ICD-10-CM | POA: Diagnosis not present

## 2016-11-12 DIAGNOSIS — I482 Chronic atrial fibrillation: Secondary | ICD-10-CM | POA: Diagnosis not present

## 2016-11-12 DIAGNOSIS — K5790 Diverticulosis of intestine, part unspecified, without perforation or abscess without bleeding: Secondary | ICD-10-CM | POA: Diagnosis not present

## 2016-11-13 DIAGNOSIS — I5032 Chronic diastolic (congestive) heart failure: Secondary | ICD-10-CM | POA: Diagnosis not present

## 2016-11-13 DIAGNOSIS — I130 Hypertensive heart and chronic kidney disease with heart failure and stage 1 through stage 4 chronic kidney disease, or unspecified chronic kidney disease: Secondary | ICD-10-CM | POA: Diagnosis not present

## 2016-11-13 DIAGNOSIS — I482 Chronic atrial fibrillation: Secondary | ICD-10-CM | POA: Diagnosis not present

## 2016-11-13 DIAGNOSIS — I350 Nonrheumatic aortic (valve) stenosis: Secondary | ICD-10-CM | POA: Diagnosis not present

## 2016-11-13 DIAGNOSIS — L97912 Non-pressure chronic ulcer of unspecified part of right lower leg with fat layer exposed: Secondary | ICD-10-CM | POA: Diagnosis not present

## 2016-11-13 DIAGNOSIS — N183 Chronic kidney disease, stage 3 (moderate): Secondary | ICD-10-CM | POA: Diagnosis not present

## 2016-11-14 DIAGNOSIS — I5032 Chronic diastolic (congestive) heart failure: Secondary | ICD-10-CM | POA: Diagnosis not present

## 2016-11-14 DIAGNOSIS — N183 Chronic kidney disease, stage 3 (moderate): Secondary | ICD-10-CM | POA: Diagnosis not present

## 2016-11-14 DIAGNOSIS — I130 Hypertensive heart and chronic kidney disease with heart failure and stage 1 through stage 4 chronic kidney disease, or unspecified chronic kidney disease: Secondary | ICD-10-CM | POA: Diagnosis not present

## 2016-11-14 DIAGNOSIS — I350 Nonrheumatic aortic (valve) stenosis: Secondary | ICD-10-CM | POA: Diagnosis not present

## 2016-11-14 DIAGNOSIS — I482 Chronic atrial fibrillation: Secondary | ICD-10-CM | POA: Diagnosis not present

## 2016-11-14 DIAGNOSIS — L97912 Non-pressure chronic ulcer of unspecified part of right lower leg with fat layer exposed: Secondary | ICD-10-CM | POA: Diagnosis not present

## 2016-11-16 DIAGNOSIS — L97912 Non-pressure chronic ulcer of unspecified part of right lower leg with fat layer exposed: Secondary | ICD-10-CM | POA: Diagnosis not present

## 2016-11-16 DIAGNOSIS — I5032 Chronic diastolic (congestive) heart failure: Secondary | ICD-10-CM | POA: Diagnosis not present

## 2016-11-16 DIAGNOSIS — I130 Hypertensive heart and chronic kidney disease with heart failure and stage 1 through stage 4 chronic kidney disease, or unspecified chronic kidney disease: Secondary | ICD-10-CM | POA: Diagnosis not present

## 2016-11-16 DIAGNOSIS — I350 Nonrheumatic aortic (valve) stenosis: Secondary | ICD-10-CM | POA: Diagnosis not present

## 2016-11-16 DIAGNOSIS — I482 Chronic atrial fibrillation: Secondary | ICD-10-CM | POA: Diagnosis not present

## 2016-11-16 DIAGNOSIS — N183 Chronic kidney disease, stage 3 (moderate): Secondary | ICD-10-CM | POA: Diagnosis not present

## 2016-11-19 DIAGNOSIS — L97912 Non-pressure chronic ulcer of unspecified part of right lower leg with fat layer exposed: Secondary | ICD-10-CM | POA: Diagnosis not present

## 2016-11-19 DIAGNOSIS — I5032 Chronic diastolic (congestive) heart failure: Secondary | ICD-10-CM | POA: Diagnosis not present

## 2016-11-19 DIAGNOSIS — I350 Nonrheumatic aortic (valve) stenosis: Secondary | ICD-10-CM | POA: Diagnosis not present

## 2016-11-19 DIAGNOSIS — N183 Chronic kidney disease, stage 3 (moderate): Secondary | ICD-10-CM | POA: Diagnosis not present

## 2016-11-19 DIAGNOSIS — I130 Hypertensive heart and chronic kidney disease with heart failure and stage 1 through stage 4 chronic kidney disease, or unspecified chronic kidney disease: Secondary | ICD-10-CM | POA: Diagnosis not present

## 2016-11-19 DIAGNOSIS — I482 Chronic atrial fibrillation: Secondary | ICD-10-CM | POA: Diagnosis not present

## 2016-11-21 DIAGNOSIS — I5032 Chronic diastolic (congestive) heart failure: Secondary | ICD-10-CM | POA: Diagnosis not present

## 2016-11-21 DIAGNOSIS — I130 Hypertensive heart and chronic kidney disease with heart failure and stage 1 through stage 4 chronic kidney disease, or unspecified chronic kidney disease: Secondary | ICD-10-CM | POA: Diagnosis not present

## 2016-11-21 DIAGNOSIS — I350 Nonrheumatic aortic (valve) stenosis: Secondary | ICD-10-CM | POA: Diagnosis not present

## 2016-11-21 DIAGNOSIS — L97912 Non-pressure chronic ulcer of unspecified part of right lower leg with fat layer exposed: Secondary | ICD-10-CM | POA: Diagnosis not present

## 2016-11-21 DIAGNOSIS — I482 Chronic atrial fibrillation: Secondary | ICD-10-CM | POA: Diagnosis not present

## 2016-11-21 DIAGNOSIS — N183 Chronic kidney disease, stage 3 (moderate): Secondary | ICD-10-CM | POA: Diagnosis not present

## 2016-11-23 DIAGNOSIS — I5032 Chronic diastolic (congestive) heart failure: Secondary | ICD-10-CM | POA: Diagnosis not present

## 2016-11-23 DIAGNOSIS — L97912 Non-pressure chronic ulcer of unspecified part of right lower leg with fat layer exposed: Secondary | ICD-10-CM | POA: Diagnosis not present

## 2016-11-23 DIAGNOSIS — I130 Hypertensive heart and chronic kidney disease with heart failure and stage 1 through stage 4 chronic kidney disease, or unspecified chronic kidney disease: Secondary | ICD-10-CM | POA: Diagnosis not present

## 2016-11-23 DIAGNOSIS — I482 Chronic atrial fibrillation: Secondary | ICD-10-CM | POA: Diagnosis not present

## 2016-11-23 DIAGNOSIS — I350 Nonrheumatic aortic (valve) stenosis: Secondary | ICD-10-CM | POA: Diagnosis not present

## 2016-11-23 DIAGNOSIS — N183 Chronic kidney disease, stage 3 (moderate): Secondary | ICD-10-CM | POA: Diagnosis not present

## 2016-11-26 DIAGNOSIS — I482 Chronic atrial fibrillation: Secondary | ICD-10-CM | POA: Diagnosis not present

## 2016-11-26 DIAGNOSIS — I130 Hypertensive heart and chronic kidney disease with heart failure and stage 1 through stage 4 chronic kidney disease, or unspecified chronic kidney disease: Secondary | ICD-10-CM | POA: Diagnosis not present

## 2016-11-26 DIAGNOSIS — I350 Nonrheumatic aortic (valve) stenosis: Secondary | ICD-10-CM | POA: Diagnosis not present

## 2016-11-26 DIAGNOSIS — I5032 Chronic diastolic (congestive) heart failure: Secondary | ICD-10-CM | POA: Diagnosis not present

## 2016-11-26 DIAGNOSIS — L97912 Non-pressure chronic ulcer of unspecified part of right lower leg with fat layer exposed: Secondary | ICD-10-CM | POA: Diagnosis not present

## 2016-11-26 DIAGNOSIS — N183 Chronic kidney disease, stage 3 (moderate): Secondary | ICD-10-CM | POA: Diagnosis not present

## 2016-11-30 DIAGNOSIS — I350 Nonrheumatic aortic (valve) stenosis: Secondary | ICD-10-CM | POA: Diagnosis not present

## 2016-11-30 DIAGNOSIS — L97912 Non-pressure chronic ulcer of unspecified part of right lower leg with fat layer exposed: Secondary | ICD-10-CM | POA: Diagnosis not present

## 2016-11-30 DIAGNOSIS — I482 Chronic atrial fibrillation: Secondary | ICD-10-CM | POA: Diagnosis not present

## 2016-11-30 DIAGNOSIS — I5032 Chronic diastolic (congestive) heart failure: Secondary | ICD-10-CM | POA: Diagnosis not present

## 2016-11-30 DIAGNOSIS — I130 Hypertensive heart and chronic kidney disease with heart failure and stage 1 through stage 4 chronic kidney disease, or unspecified chronic kidney disease: Secondary | ICD-10-CM | POA: Diagnosis not present

## 2016-11-30 DIAGNOSIS — N183 Chronic kidney disease, stage 3 (moderate): Secondary | ICD-10-CM | POA: Diagnosis not present

## 2016-12-03 DIAGNOSIS — L97912 Non-pressure chronic ulcer of unspecified part of right lower leg with fat layer exposed: Secondary | ICD-10-CM | POA: Diagnosis not present

## 2016-12-03 DIAGNOSIS — I350 Nonrheumatic aortic (valve) stenosis: Secondary | ICD-10-CM | POA: Diagnosis not present

## 2016-12-03 DIAGNOSIS — I130 Hypertensive heart and chronic kidney disease with heart failure and stage 1 through stage 4 chronic kidney disease, or unspecified chronic kidney disease: Secondary | ICD-10-CM | POA: Diagnosis not present

## 2016-12-03 DIAGNOSIS — N183 Chronic kidney disease, stage 3 (moderate): Secondary | ICD-10-CM | POA: Diagnosis not present

## 2016-12-03 DIAGNOSIS — I482 Chronic atrial fibrillation: Secondary | ICD-10-CM | POA: Diagnosis not present

## 2016-12-03 DIAGNOSIS — I5032 Chronic diastolic (congestive) heart failure: Secondary | ICD-10-CM | POA: Diagnosis not present

## 2016-12-05 DIAGNOSIS — N183 Chronic kidney disease, stage 3 (moderate): Secondary | ICD-10-CM | POA: Diagnosis not present

## 2016-12-05 DIAGNOSIS — L97912 Non-pressure chronic ulcer of unspecified part of right lower leg with fat layer exposed: Secondary | ICD-10-CM | POA: Diagnosis not present

## 2016-12-05 DIAGNOSIS — I482 Chronic atrial fibrillation: Secondary | ICD-10-CM | POA: Diagnosis not present

## 2016-12-05 DIAGNOSIS — I350 Nonrheumatic aortic (valve) stenosis: Secondary | ICD-10-CM | POA: Diagnosis not present

## 2016-12-05 DIAGNOSIS — I5032 Chronic diastolic (congestive) heart failure: Secondary | ICD-10-CM | POA: Diagnosis not present

## 2016-12-05 DIAGNOSIS — I130 Hypertensive heart and chronic kidney disease with heart failure and stage 1 through stage 4 chronic kidney disease, or unspecified chronic kidney disease: Secondary | ICD-10-CM | POA: Diagnosis not present

## 2016-12-07 DIAGNOSIS — I482 Chronic atrial fibrillation: Secondary | ICD-10-CM | POA: Diagnosis not present

## 2016-12-07 DIAGNOSIS — L97912 Non-pressure chronic ulcer of unspecified part of right lower leg with fat layer exposed: Secondary | ICD-10-CM | POA: Diagnosis not present

## 2016-12-07 DIAGNOSIS — N183 Chronic kidney disease, stage 3 (moderate): Secondary | ICD-10-CM | POA: Diagnosis not present

## 2016-12-07 DIAGNOSIS — I350 Nonrheumatic aortic (valve) stenosis: Secondary | ICD-10-CM | POA: Diagnosis not present

## 2016-12-07 DIAGNOSIS — I130 Hypertensive heart and chronic kidney disease with heart failure and stage 1 through stage 4 chronic kidney disease, or unspecified chronic kidney disease: Secondary | ICD-10-CM | POA: Diagnosis not present

## 2016-12-07 DIAGNOSIS — I5032 Chronic diastolic (congestive) heart failure: Secondary | ICD-10-CM | POA: Diagnosis not present

## 2016-12-09 DIAGNOSIS — L97912 Non-pressure chronic ulcer of unspecified part of right lower leg with fat layer exposed: Secondary | ICD-10-CM | POA: Diagnosis not present

## 2016-12-09 DIAGNOSIS — I482 Chronic atrial fibrillation: Secondary | ICD-10-CM | POA: Diagnosis not present

## 2016-12-09 DIAGNOSIS — I350 Nonrheumatic aortic (valve) stenosis: Secondary | ICD-10-CM | POA: Diagnosis not present

## 2016-12-09 DIAGNOSIS — I5032 Chronic diastolic (congestive) heart failure: Secondary | ICD-10-CM | POA: Diagnosis not present

## 2016-12-09 DIAGNOSIS — I130 Hypertensive heart and chronic kidney disease with heart failure and stage 1 through stage 4 chronic kidney disease, or unspecified chronic kidney disease: Secondary | ICD-10-CM | POA: Diagnosis not present

## 2016-12-09 DIAGNOSIS — N183 Chronic kidney disease, stage 3 (moderate): Secondary | ICD-10-CM | POA: Diagnosis not present

## 2016-12-10 DIAGNOSIS — L97912 Non-pressure chronic ulcer of unspecified part of right lower leg with fat layer exposed: Secondary | ICD-10-CM | POA: Diagnosis not present

## 2016-12-10 DIAGNOSIS — I5032 Chronic diastolic (congestive) heart failure: Secondary | ICD-10-CM | POA: Diagnosis not present

## 2016-12-10 DIAGNOSIS — I130 Hypertensive heart and chronic kidney disease with heart failure and stage 1 through stage 4 chronic kidney disease, or unspecified chronic kidney disease: Secondary | ICD-10-CM | POA: Diagnosis not present

## 2016-12-10 DIAGNOSIS — I350 Nonrheumatic aortic (valve) stenosis: Secondary | ICD-10-CM | POA: Diagnosis not present

## 2016-12-10 DIAGNOSIS — N183 Chronic kidney disease, stage 3 (moderate): Secondary | ICD-10-CM | POA: Diagnosis not present

## 2016-12-10 DIAGNOSIS — I482 Chronic atrial fibrillation: Secondary | ICD-10-CM | POA: Diagnosis not present

## 2016-12-11 DIAGNOSIS — I130 Hypertensive heart and chronic kidney disease with heart failure and stage 1 through stage 4 chronic kidney disease, or unspecified chronic kidney disease: Secondary | ICD-10-CM | POA: Diagnosis not present

## 2016-12-11 DIAGNOSIS — H353212 Exudative age-related macular degeneration, right eye, with inactive choroidal neovascularization: Secondary | ICD-10-CM | POA: Diagnosis not present

## 2016-12-11 DIAGNOSIS — H353223 Exudative age-related macular degeneration, left eye, with inactive scar: Secondary | ICD-10-CM | POA: Diagnosis not present

## 2016-12-11 DIAGNOSIS — I482 Chronic atrial fibrillation: Secondary | ICD-10-CM | POA: Diagnosis not present

## 2016-12-11 DIAGNOSIS — L97912 Non-pressure chronic ulcer of unspecified part of right lower leg with fat layer exposed: Secondary | ICD-10-CM | POA: Diagnosis not present

## 2016-12-11 DIAGNOSIS — I5032 Chronic diastolic (congestive) heart failure: Secondary | ICD-10-CM | POA: Diagnosis not present

## 2016-12-11 DIAGNOSIS — N183 Chronic kidney disease, stage 3 (moderate): Secondary | ICD-10-CM | POA: Diagnosis not present

## 2016-12-11 DIAGNOSIS — H43813 Vitreous degeneration, bilateral: Secondary | ICD-10-CM | POA: Diagnosis not present

## 2016-12-11 DIAGNOSIS — I350 Nonrheumatic aortic (valve) stenosis: Secondary | ICD-10-CM | POA: Diagnosis not present

## 2016-12-12 DIAGNOSIS — I130 Hypertensive heart and chronic kidney disease with heart failure and stage 1 through stage 4 chronic kidney disease, or unspecified chronic kidney disease: Secondary | ICD-10-CM | POA: Diagnosis not present

## 2016-12-12 DIAGNOSIS — I5032 Chronic diastolic (congestive) heart failure: Secondary | ICD-10-CM | POA: Diagnosis not present

## 2016-12-12 DIAGNOSIS — N183 Chronic kidney disease, stage 3 (moderate): Secondary | ICD-10-CM | POA: Diagnosis not present

## 2016-12-12 DIAGNOSIS — L97912 Non-pressure chronic ulcer of unspecified part of right lower leg with fat layer exposed: Secondary | ICD-10-CM | POA: Diagnosis not present

## 2016-12-12 DIAGNOSIS — I482 Chronic atrial fibrillation: Secondary | ICD-10-CM | POA: Diagnosis not present

## 2016-12-12 DIAGNOSIS — I350 Nonrheumatic aortic (valve) stenosis: Secondary | ICD-10-CM | POA: Diagnosis not present

## 2016-12-13 DIAGNOSIS — I350 Nonrheumatic aortic (valve) stenosis: Secondary | ICD-10-CM | POA: Diagnosis not present

## 2016-12-13 DIAGNOSIS — I25119 Atherosclerotic heart disease of native coronary artery with unspecified angina pectoris: Secondary | ICD-10-CM | POA: Diagnosis not present

## 2016-12-13 DIAGNOSIS — I482 Chronic atrial fibrillation: Secondary | ICD-10-CM | POA: Diagnosis not present

## 2016-12-13 DIAGNOSIS — D649 Anemia, unspecified: Secondary | ICD-10-CM | POA: Diagnosis not present

## 2016-12-13 DIAGNOSIS — L97912 Non-pressure chronic ulcer of unspecified part of right lower leg with fat layer exposed: Secondary | ICD-10-CM | POA: Diagnosis not present

## 2016-12-13 DIAGNOSIS — N183 Chronic kidney disease, stage 3 (moderate): Secondary | ICD-10-CM | POA: Diagnosis not present

## 2016-12-13 DIAGNOSIS — I130 Hypertensive heart and chronic kidney disease with heart failure and stage 1 through stage 4 chronic kidney disease, or unspecified chronic kidney disease: Secondary | ICD-10-CM | POA: Diagnosis not present

## 2016-12-13 DIAGNOSIS — I5032 Chronic diastolic (congestive) heart failure: Secondary | ICD-10-CM | POA: Diagnosis not present

## 2016-12-13 DIAGNOSIS — E669 Obesity, unspecified: Secondary | ICD-10-CM | POA: Diagnosis not present

## 2016-12-13 DIAGNOSIS — K5790 Diverticulosis of intestine, part unspecified, without perforation or abscess without bleeding: Secondary | ICD-10-CM | POA: Diagnosis not present

## 2016-12-14 DIAGNOSIS — I350 Nonrheumatic aortic (valve) stenosis: Secondary | ICD-10-CM | POA: Diagnosis not present

## 2016-12-14 DIAGNOSIS — L97912 Non-pressure chronic ulcer of unspecified part of right lower leg with fat layer exposed: Secondary | ICD-10-CM | POA: Diagnosis not present

## 2016-12-14 DIAGNOSIS — I482 Chronic atrial fibrillation: Secondary | ICD-10-CM | POA: Diagnosis not present

## 2016-12-14 DIAGNOSIS — N183 Chronic kidney disease, stage 3 (moderate): Secondary | ICD-10-CM | POA: Diagnosis not present

## 2016-12-14 DIAGNOSIS — I5032 Chronic diastolic (congestive) heart failure: Secondary | ICD-10-CM | POA: Diagnosis not present

## 2016-12-14 DIAGNOSIS — I130 Hypertensive heart and chronic kidney disease with heart failure and stage 1 through stage 4 chronic kidney disease, or unspecified chronic kidney disease: Secondary | ICD-10-CM | POA: Diagnosis not present

## 2016-12-17 DIAGNOSIS — I350 Nonrheumatic aortic (valve) stenosis: Secondary | ICD-10-CM | POA: Diagnosis not present

## 2016-12-17 DIAGNOSIS — I482 Chronic atrial fibrillation: Secondary | ICD-10-CM | POA: Diagnosis not present

## 2016-12-17 DIAGNOSIS — I130 Hypertensive heart and chronic kidney disease with heart failure and stage 1 through stage 4 chronic kidney disease, or unspecified chronic kidney disease: Secondary | ICD-10-CM | POA: Diagnosis not present

## 2016-12-17 DIAGNOSIS — I5032 Chronic diastolic (congestive) heart failure: Secondary | ICD-10-CM | POA: Diagnosis not present

## 2016-12-17 DIAGNOSIS — L97912 Non-pressure chronic ulcer of unspecified part of right lower leg with fat layer exposed: Secondary | ICD-10-CM | POA: Diagnosis not present

## 2016-12-17 DIAGNOSIS — N183 Chronic kidney disease, stage 3 (moderate): Secondary | ICD-10-CM | POA: Diagnosis not present

## 2016-12-19 DIAGNOSIS — I5032 Chronic diastolic (congestive) heart failure: Secondary | ICD-10-CM | POA: Diagnosis not present

## 2016-12-19 DIAGNOSIS — L97912 Non-pressure chronic ulcer of unspecified part of right lower leg with fat layer exposed: Secondary | ICD-10-CM | POA: Diagnosis not present

## 2016-12-19 DIAGNOSIS — I350 Nonrheumatic aortic (valve) stenosis: Secondary | ICD-10-CM | POA: Diagnosis not present

## 2016-12-19 DIAGNOSIS — I130 Hypertensive heart and chronic kidney disease with heart failure and stage 1 through stage 4 chronic kidney disease, or unspecified chronic kidney disease: Secondary | ICD-10-CM | POA: Diagnosis not present

## 2016-12-19 DIAGNOSIS — I482 Chronic atrial fibrillation: Secondary | ICD-10-CM | POA: Diagnosis not present

## 2016-12-19 DIAGNOSIS — N183 Chronic kidney disease, stage 3 (moderate): Secondary | ICD-10-CM | POA: Diagnosis not present

## 2016-12-20 ENCOUNTER — Ambulatory Visit (INDEPENDENT_AMBULATORY_CARE_PROVIDER_SITE_OTHER): Payer: Medicare Other | Admitting: Podiatry

## 2016-12-20 DIAGNOSIS — M79671 Pain in right foot: Secondary | ICD-10-CM

## 2016-12-20 DIAGNOSIS — B351 Tinea unguium: Secondary | ICD-10-CM | POA: Diagnosis not present

## 2016-12-20 DIAGNOSIS — M79672 Pain in left foot: Secondary | ICD-10-CM

## 2016-12-20 DIAGNOSIS — L600 Ingrowing nail: Secondary | ICD-10-CM | POA: Diagnosis not present

## 2016-12-21 ENCOUNTER — Encounter: Payer: Self-pay | Admitting: Podiatry

## 2016-12-21 DIAGNOSIS — I350 Nonrheumatic aortic (valve) stenosis: Secondary | ICD-10-CM | POA: Diagnosis not present

## 2016-12-21 DIAGNOSIS — I130 Hypertensive heart and chronic kidney disease with heart failure and stage 1 through stage 4 chronic kidney disease, or unspecified chronic kidney disease: Secondary | ICD-10-CM | POA: Diagnosis not present

## 2016-12-21 DIAGNOSIS — L97912 Non-pressure chronic ulcer of unspecified part of right lower leg with fat layer exposed: Secondary | ICD-10-CM | POA: Diagnosis not present

## 2016-12-21 DIAGNOSIS — N183 Chronic kidney disease, stage 3 (moderate): Secondary | ICD-10-CM | POA: Diagnosis not present

## 2016-12-21 DIAGNOSIS — I482 Chronic atrial fibrillation: Secondary | ICD-10-CM | POA: Diagnosis not present

## 2016-12-21 DIAGNOSIS — I5032 Chronic diastolic (congestive) heart failure: Secondary | ICD-10-CM | POA: Diagnosis not present

## 2016-12-21 NOTE — Patient Instructions (Signed)
Seen for hypertrophic nails and painful ingrown nails. All nails debrided. Return in 3 months or as needed.  

## 2016-12-21 NOTE — Progress Notes (Signed)
SUBJECTIVE: 83 y.o. year old female presents via wheel chair complaining of painful toe. Both big toes are painful.Having hard of hearing.   OBJECTIVE: DERMATOLOGIC EXAMINATION: Painful ingrown nails both great toes without open infection or drainage. Hypertrophic nails x 10. VASCULAR EXAMINATION OF LOWER LIMBS: Pedal pulses: All pedal pulses are palpable with normal pulsation. Minimum edema noted. Temperature gradient from tibial crest to dorsum of foot is within normal bilateral. NEUROLOGIC EXAMINATION OF THE LOWER LIMBS: All epicritic and tactile sensations grossly intact. MUSCULOSKELETAL EXAMINATION: Mild digital contracture lesser digits bilateral.  ASSESSMENT: Onychomycosis x 10. Ingrown nail both great toes. Pain in lower limb.  Plan: All nails debrided. Pain was relieved. Return in 3 months 

## 2016-12-24 DIAGNOSIS — N183 Chronic kidney disease, stage 3 (moderate): Secondary | ICD-10-CM | POA: Diagnosis not present

## 2016-12-24 DIAGNOSIS — I482 Chronic atrial fibrillation: Secondary | ICD-10-CM | POA: Diagnosis not present

## 2016-12-24 DIAGNOSIS — I5032 Chronic diastolic (congestive) heart failure: Secondary | ICD-10-CM | POA: Diagnosis not present

## 2016-12-24 DIAGNOSIS — L97912 Non-pressure chronic ulcer of unspecified part of right lower leg with fat layer exposed: Secondary | ICD-10-CM | POA: Diagnosis not present

## 2016-12-24 DIAGNOSIS — I350 Nonrheumatic aortic (valve) stenosis: Secondary | ICD-10-CM | POA: Diagnosis not present

## 2016-12-24 DIAGNOSIS — I130 Hypertensive heart and chronic kidney disease with heart failure and stage 1 through stage 4 chronic kidney disease, or unspecified chronic kidney disease: Secondary | ICD-10-CM | POA: Diagnosis not present

## 2016-12-28 DIAGNOSIS — I5032 Chronic diastolic (congestive) heart failure: Secondary | ICD-10-CM | POA: Diagnosis not present

## 2016-12-28 DIAGNOSIS — L97912 Non-pressure chronic ulcer of unspecified part of right lower leg with fat layer exposed: Secondary | ICD-10-CM | POA: Diagnosis not present

## 2016-12-28 DIAGNOSIS — I130 Hypertensive heart and chronic kidney disease with heart failure and stage 1 through stage 4 chronic kidney disease, or unspecified chronic kidney disease: Secondary | ICD-10-CM | POA: Diagnosis not present

## 2016-12-28 DIAGNOSIS — N183 Chronic kidney disease, stage 3 (moderate): Secondary | ICD-10-CM | POA: Diagnosis not present

## 2016-12-28 DIAGNOSIS — I350 Nonrheumatic aortic (valve) stenosis: Secondary | ICD-10-CM | POA: Diagnosis not present

## 2016-12-28 DIAGNOSIS — I482 Chronic atrial fibrillation: Secondary | ICD-10-CM | POA: Diagnosis not present

## 2016-12-31 DIAGNOSIS — L97912 Non-pressure chronic ulcer of unspecified part of right lower leg with fat layer exposed: Secondary | ICD-10-CM | POA: Diagnosis not present

## 2016-12-31 DIAGNOSIS — I482 Chronic atrial fibrillation: Secondary | ICD-10-CM | POA: Diagnosis not present

## 2016-12-31 DIAGNOSIS — N183 Chronic kidney disease, stage 3 (moderate): Secondary | ICD-10-CM | POA: Diagnosis not present

## 2016-12-31 DIAGNOSIS — I350 Nonrheumatic aortic (valve) stenosis: Secondary | ICD-10-CM | POA: Diagnosis not present

## 2016-12-31 DIAGNOSIS — I5032 Chronic diastolic (congestive) heart failure: Secondary | ICD-10-CM | POA: Diagnosis not present

## 2016-12-31 DIAGNOSIS — I130 Hypertensive heart and chronic kidney disease with heart failure and stage 1 through stage 4 chronic kidney disease, or unspecified chronic kidney disease: Secondary | ICD-10-CM | POA: Diagnosis not present

## 2017-01-02 DIAGNOSIS — I130 Hypertensive heart and chronic kidney disease with heart failure and stage 1 through stage 4 chronic kidney disease, or unspecified chronic kidney disease: Secondary | ICD-10-CM | POA: Diagnosis not present

## 2017-01-02 DIAGNOSIS — I482 Chronic atrial fibrillation: Secondary | ICD-10-CM | POA: Diagnosis not present

## 2017-01-02 DIAGNOSIS — I5032 Chronic diastolic (congestive) heart failure: Secondary | ICD-10-CM | POA: Diagnosis not present

## 2017-01-02 DIAGNOSIS — L97912 Non-pressure chronic ulcer of unspecified part of right lower leg with fat layer exposed: Secondary | ICD-10-CM | POA: Diagnosis not present

## 2017-01-02 DIAGNOSIS — N183 Chronic kidney disease, stage 3 (moderate): Secondary | ICD-10-CM | POA: Diagnosis not present

## 2017-01-02 DIAGNOSIS — I350 Nonrheumatic aortic (valve) stenosis: Secondary | ICD-10-CM | POA: Diagnosis not present

## 2017-01-04 DIAGNOSIS — I350 Nonrheumatic aortic (valve) stenosis: Secondary | ICD-10-CM | POA: Diagnosis not present

## 2017-01-04 DIAGNOSIS — N183 Chronic kidney disease, stage 3 (moderate): Secondary | ICD-10-CM | POA: Diagnosis not present

## 2017-01-04 DIAGNOSIS — I5032 Chronic diastolic (congestive) heart failure: Secondary | ICD-10-CM | POA: Diagnosis not present

## 2017-01-04 DIAGNOSIS — I482 Chronic atrial fibrillation: Secondary | ICD-10-CM | POA: Diagnosis not present

## 2017-01-04 DIAGNOSIS — L97912 Non-pressure chronic ulcer of unspecified part of right lower leg with fat layer exposed: Secondary | ICD-10-CM | POA: Diagnosis not present

## 2017-01-04 DIAGNOSIS — I130 Hypertensive heart and chronic kidney disease with heart failure and stage 1 through stage 4 chronic kidney disease, or unspecified chronic kidney disease: Secondary | ICD-10-CM | POA: Diagnosis not present

## 2017-01-07 DIAGNOSIS — I5032 Chronic diastolic (congestive) heart failure: Secondary | ICD-10-CM | POA: Diagnosis not present

## 2017-01-07 DIAGNOSIS — I482 Chronic atrial fibrillation: Secondary | ICD-10-CM | POA: Diagnosis not present

## 2017-01-07 DIAGNOSIS — L97912 Non-pressure chronic ulcer of unspecified part of right lower leg with fat layer exposed: Secondary | ICD-10-CM | POA: Diagnosis not present

## 2017-01-07 DIAGNOSIS — N183 Chronic kidney disease, stage 3 (moderate): Secondary | ICD-10-CM | POA: Diagnosis not present

## 2017-01-07 DIAGNOSIS — I350 Nonrheumatic aortic (valve) stenosis: Secondary | ICD-10-CM | POA: Diagnosis not present

## 2017-01-07 DIAGNOSIS — I130 Hypertensive heart and chronic kidney disease with heart failure and stage 1 through stage 4 chronic kidney disease, or unspecified chronic kidney disease: Secondary | ICD-10-CM | POA: Diagnosis not present

## 2017-01-08 DIAGNOSIS — I350 Nonrheumatic aortic (valve) stenosis: Secondary | ICD-10-CM | POA: Diagnosis not present

## 2017-01-08 DIAGNOSIS — I482 Chronic atrial fibrillation: Secondary | ICD-10-CM | POA: Diagnosis not present

## 2017-01-08 DIAGNOSIS — I130 Hypertensive heart and chronic kidney disease with heart failure and stage 1 through stage 4 chronic kidney disease, or unspecified chronic kidney disease: Secondary | ICD-10-CM | POA: Diagnosis not present

## 2017-01-08 DIAGNOSIS — I5032 Chronic diastolic (congestive) heart failure: Secondary | ICD-10-CM | POA: Diagnosis not present

## 2017-01-08 DIAGNOSIS — N183 Chronic kidney disease, stage 3 (moderate): Secondary | ICD-10-CM | POA: Diagnosis not present

## 2017-01-08 DIAGNOSIS — L97912 Non-pressure chronic ulcer of unspecified part of right lower leg with fat layer exposed: Secondary | ICD-10-CM | POA: Diagnosis not present

## 2017-01-09 DIAGNOSIS — I482 Chronic atrial fibrillation: Secondary | ICD-10-CM | POA: Diagnosis not present

## 2017-01-09 DIAGNOSIS — N183 Chronic kidney disease, stage 3 (moderate): Secondary | ICD-10-CM | POA: Diagnosis not present

## 2017-01-09 DIAGNOSIS — I5032 Chronic diastolic (congestive) heart failure: Secondary | ICD-10-CM | POA: Diagnosis not present

## 2017-01-09 DIAGNOSIS — I130 Hypertensive heart and chronic kidney disease with heart failure and stage 1 through stage 4 chronic kidney disease, or unspecified chronic kidney disease: Secondary | ICD-10-CM | POA: Diagnosis not present

## 2017-01-09 DIAGNOSIS — L97912 Non-pressure chronic ulcer of unspecified part of right lower leg with fat layer exposed: Secondary | ICD-10-CM | POA: Diagnosis not present

## 2017-01-09 DIAGNOSIS — I350 Nonrheumatic aortic (valve) stenosis: Secondary | ICD-10-CM | POA: Diagnosis not present

## 2017-01-10 DIAGNOSIS — E669 Obesity, unspecified: Secondary | ICD-10-CM | POA: Diagnosis not present

## 2017-01-10 DIAGNOSIS — I5032 Chronic diastolic (congestive) heart failure: Secondary | ICD-10-CM | POA: Diagnosis not present

## 2017-01-10 DIAGNOSIS — D649 Anemia, unspecified: Secondary | ICD-10-CM | POA: Diagnosis not present

## 2017-01-10 DIAGNOSIS — I25119 Atherosclerotic heart disease of native coronary artery with unspecified angina pectoris: Secondary | ICD-10-CM | POA: Diagnosis not present

## 2017-01-10 DIAGNOSIS — K5790 Diverticulosis of intestine, part unspecified, without perforation or abscess without bleeding: Secondary | ICD-10-CM | POA: Diagnosis not present

## 2017-01-10 DIAGNOSIS — N183 Chronic kidney disease, stage 3 (moderate): Secondary | ICD-10-CM | POA: Diagnosis not present

## 2017-01-10 DIAGNOSIS — L97912 Non-pressure chronic ulcer of unspecified part of right lower leg with fat layer exposed: Secondary | ICD-10-CM | POA: Diagnosis not present

## 2017-01-10 DIAGNOSIS — I482 Chronic atrial fibrillation: Secondary | ICD-10-CM | POA: Diagnosis not present

## 2017-01-10 DIAGNOSIS — I350 Nonrheumatic aortic (valve) stenosis: Secondary | ICD-10-CM | POA: Diagnosis not present

## 2017-01-10 DIAGNOSIS — I130 Hypertensive heart and chronic kidney disease with heart failure and stage 1 through stage 4 chronic kidney disease, or unspecified chronic kidney disease: Secondary | ICD-10-CM | POA: Diagnosis not present

## 2017-01-11 DIAGNOSIS — I482 Chronic atrial fibrillation: Secondary | ICD-10-CM | POA: Diagnosis not present

## 2017-01-11 DIAGNOSIS — N183 Chronic kidney disease, stage 3 (moderate): Secondary | ICD-10-CM | POA: Diagnosis not present

## 2017-01-11 DIAGNOSIS — I130 Hypertensive heart and chronic kidney disease with heart failure and stage 1 through stage 4 chronic kidney disease, or unspecified chronic kidney disease: Secondary | ICD-10-CM | POA: Diagnosis not present

## 2017-01-11 DIAGNOSIS — I5032 Chronic diastolic (congestive) heart failure: Secondary | ICD-10-CM | POA: Diagnosis not present

## 2017-01-11 DIAGNOSIS — L97912 Non-pressure chronic ulcer of unspecified part of right lower leg with fat layer exposed: Secondary | ICD-10-CM | POA: Diagnosis not present

## 2017-01-11 DIAGNOSIS — I350 Nonrheumatic aortic (valve) stenosis: Secondary | ICD-10-CM | POA: Diagnosis not present

## 2017-01-14 DIAGNOSIS — N183 Chronic kidney disease, stage 3 (moderate): Secondary | ICD-10-CM | POA: Diagnosis not present

## 2017-01-14 DIAGNOSIS — I350 Nonrheumatic aortic (valve) stenosis: Secondary | ICD-10-CM | POA: Diagnosis not present

## 2017-01-14 DIAGNOSIS — I5032 Chronic diastolic (congestive) heart failure: Secondary | ICD-10-CM | POA: Diagnosis not present

## 2017-01-14 DIAGNOSIS — I482 Chronic atrial fibrillation: Secondary | ICD-10-CM | POA: Diagnosis not present

## 2017-01-14 DIAGNOSIS — L97912 Non-pressure chronic ulcer of unspecified part of right lower leg with fat layer exposed: Secondary | ICD-10-CM | POA: Diagnosis not present

## 2017-01-14 DIAGNOSIS — I130 Hypertensive heart and chronic kidney disease with heart failure and stage 1 through stage 4 chronic kidney disease, or unspecified chronic kidney disease: Secondary | ICD-10-CM | POA: Diagnosis not present

## 2017-01-16 DIAGNOSIS — I482 Chronic atrial fibrillation: Secondary | ICD-10-CM | POA: Diagnosis not present

## 2017-01-16 DIAGNOSIS — L97912 Non-pressure chronic ulcer of unspecified part of right lower leg with fat layer exposed: Secondary | ICD-10-CM | POA: Diagnosis not present

## 2017-01-16 DIAGNOSIS — I350 Nonrheumatic aortic (valve) stenosis: Secondary | ICD-10-CM | POA: Diagnosis not present

## 2017-01-16 DIAGNOSIS — N183 Chronic kidney disease, stage 3 (moderate): Secondary | ICD-10-CM | POA: Diagnosis not present

## 2017-01-16 DIAGNOSIS — I5032 Chronic diastolic (congestive) heart failure: Secondary | ICD-10-CM | POA: Diagnosis not present

## 2017-01-16 DIAGNOSIS — I130 Hypertensive heart and chronic kidney disease with heart failure and stage 1 through stage 4 chronic kidney disease, or unspecified chronic kidney disease: Secondary | ICD-10-CM | POA: Diagnosis not present

## 2017-01-18 DIAGNOSIS — L97912 Non-pressure chronic ulcer of unspecified part of right lower leg with fat layer exposed: Secondary | ICD-10-CM | POA: Diagnosis not present

## 2017-01-18 DIAGNOSIS — I482 Chronic atrial fibrillation: Secondary | ICD-10-CM | POA: Diagnosis not present

## 2017-01-18 DIAGNOSIS — I350 Nonrheumatic aortic (valve) stenosis: Secondary | ICD-10-CM | POA: Diagnosis not present

## 2017-01-18 DIAGNOSIS — I5032 Chronic diastolic (congestive) heart failure: Secondary | ICD-10-CM | POA: Diagnosis not present

## 2017-01-18 DIAGNOSIS — I130 Hypertensive heart and chronic kidney disease with heart failure and stage 1 through stage 4 chronic kidney disease, or unspecified chronic kidney disease: Secondary | ICD-10-CM | POA: Diagnosis not present

## 2017-01-18 DIAGNOSIS — N183 Chronic kidney disease, stage 3 (moderate): Secondary | ICD-10-CM | POA: Diagnosis not present

## 2017-01-21 DIAGNOSIS — I5032 Chronic diastolic (congestive) heart failure: Secondary | ICD-10-CM | POA: Diagnosis not present

## 2017-01-21 DIAGNOSIS — L97912 Non-pressure chronic ulcer of unspecified part of right lower leg with fat layer exposed: Secondary | ICD-10-CM | POA: Diagnosis not present

## 2017-01-21 DIAGNOSIS — N183 Chronic kidney disease, stage 3 (moderate): Secondary | ICD-10-CM | POA: Diagnosis not present

## 2017-01-21 DIAGNOSIS — I350 Nonrheumatic aortic (valve) stenosis: Secondary | ICD-10-CM | POA: Diagnosis not present

## 2017-01-21 DIAGNOSIS — I482 Chronic atrial fibrillation: Secondary | ICD-10-CM | POA: Diagnosis not present

## 2017-01-21 DIAGNOSIS — I130 Hypertensive heart and chronic kidney disease with heart failure and stage 1 through stage 4 chronic kidney disease, or unspecified chronic kidney disease: Secondary | ICD-10-CM | POA: Diagnosis not present

## 2017-01-22 ENCOUNTER — Telehealth: Payer: Self-pay | Admitting: *Deleted

## 2017-01-22 DIAGNOSIS — I5032 Chronic diastolic (congestive) heart failure: Secondary | ICD-10-CM | POA: Diagnosis not present

## 2017-01-22 DIAGNOSIS — N183 Chronic kidney disease, stage 3 (moderate): Secondary | ICD-10-CM | POA: Diagnosis not present

## 2017-01-22 DIAGNOSIS — L97912 Non-pressure chronic ulcer of unspecified part of right lower leg with fat layer exposed: Secondary | ICD-10-CM | POA: Diagnosis not present

## 2017-01-22 DIAGNOSIS — I482 Chronic atrial fibrillation: Secondary | ICD-10-CM | POA: Diagnosis not present

## 2017-01-22 DIAGNOSIS — I350 Nonrheumatic aortic (valve) stenosis: Secondary | ICD-10-CM | POA: Diagnosis not present

## 2017-01-22 DIAGNOSIS — I130 Hypertensive heart and chronic kidney disease with heart failure and stage 1 through stage 4 chronic kidney disease, or unspecified chronic kidney disease: Secondary | ICD-10-CM | POA: Diagnosis not present

## 2017-01-22 MED ORDER — LEVOTHYROXINE SODIUM 75 MCG PO TABS: 75.0000 ug | ORAL_TABLET | Freq: Every day | ORAL | 3 refills | Status: AC

## 2017-01-22 NOTE — Telephone Encounter (Signed)
[name] pt & she is out of medication, Son is requesting it to be at [facility] if Dr Stanford Breed will fill, thanks

## 2017-01-22 NOTE — Telephone Encounter (Signed)
Refill sent to the pharmacy electronically.  

## 2017-01-23 DIAGNOSIS — N183 Chronic kidney disease, stage 3 (moderate): Secondary | ICD-10-CM | POA: Diagnosis not present

## 2017-01-23 DIAGNOSIS — I350 Nonrheumatic aortic (valve) stenosis: Secondary | ICD-10-CM | POA: Diagnosis not present

## 2017-01-23 DIAGNOSIS — I130 Hypertensive heart and chronic kidney disease with heart failure and stage 1 through stage 4 chronic kidney disease, or unspecified chronic kidney disease: Secondary | ICD-10-CM | POA: Diagnosis not present

## 2017-01-23 DIAGNOSIS — I482 Chronic atrial fibrillation: Secondary | ICD-10-CM | POA: Diagnosis not present

## 2017-01-23 DIAGNOSIS — L97912 Non-pressure chronic ulcer of unspecified part of right lower leg with fat layer exposed: Secondary | ICD-10-CM | POA: Diagnosis not present

## 2017-01-23 DIAGNOSIS — I5032 Chronic diastolic (congestive) heart failure: Secondary | ICD-10-CM | POA: Diagnosis not present

## 2017-01-25 DIAGNOSIS — I350 Nonrheumatic aortic (valve) stenosis: Secondary | ICD-10-CM | POA: Diagnosis not present

## 2017-01-25 DIAGNOSIS — I130 Hypertensive heart and chronic kidney disease with heart failure and stage 1 through stage 4 chronic kidney disease, or unspecified chronic kidney disease: Secondary | ICD-10-CM | POA: Diagnosis not present

## 2017-01-25 DIAGNOSIS — I5032 Chronic diastolic (congestive) heart failure: Secondary | ICD-10-CM | POA: Diagnosis not present

## 2017-01-25 DIAGNOSIS — I482 Chronic atrial fibrillation: Secondary | ICD-10-CM | POA: Diagnosis not present

## 2017-01-25 DIAGNOSIS — N183 Chronic kidney disease, stage 3 (moderate): Secondary | ICD-10-CM | POA: Diagnosis not present

## 2017-01-25 DIAGNOSIS — L97912 Non-pressure chronic ulcer of unspecified part of right lower leg with fat layer exposed: Secondary | ICD-10-CM | POA: Diagnosis not present

## 2017-01-28 DIAGNOSIS — I130 Hypertensive heart and chronic kidney disease with heart failure and stage 1 through stage 4 chronic kidney disease, or unspecified chronic kidney disease: Secondary | ICD-10-CM | POA: Diagnosis not present

## 2017-01-28 DIAGNOSIS — I350 Nonrheumatic aortic (valve) stenosis: Secondary | ICD-10-CM | POA: Diagnosis not present

## 2017-01-28 DIAGNOSIS — I482 Chronic atrial fibrillation: Secondary | ICD-10-CM | POA: Diagnosis not present

## 2017-01-28 DIAGNOSIS — I5032 Chronic diastolic (congestive) heart failure: Secondary | ICD-10-CM | POA: Diagnosis not present

## 2017-01-28 DIAGNOSIS — L97912 Non-pressure chronic ulcer of unspecified part of right lower leg with fat layer exposed: Secondary | ICD-10-CM | POA: Diagnosis not present

## 2017-01-28 DIAGNOSIS — N183 Chronic kidney disease, stage 3 (moderate): Secondary | ICD-10-CM | POA: Diagnosis not present

## 2017-01-29 DIAGNOSIS — I5032 Chronic diastolic (congestive) heart failure: Secondary | ICD-10-CM | POA: Diagnosis not present

## 2017-01-29 DIAGNOSIS — L97912 Non-pressure chronic ulcer of unspecified part of right lower leg with fat layer exposed: Secondary | ICD-10-CM | POA: Diagnosis not present

## 2017-01-29 DIAGNOSIS — I350 Nonrheumatic aortic (valve) stenosis: Secondary | ICD-10-CM | POA: Diagnosis not present

## 2017-01-29 DIAGNOSIS — I130 Hypertensive heart and chronic kidney disease with heart failure and stage 1 through stage 4 chronic kidney disease, or unspecified chronic kidney disease: Secondary | ICD-10-CM | POA: Diagnosis not present

## 2017-01-29 DIAGNOSIS — I482 Chronic atrial fibrillation: Secondary | ICD-10-CM | POA: Diagnosis not present

## 2017-01-29 DIAGNOSIS — N183 Chronic kidney disease, stage 3 (moderate): Secondary | ICD-10-CM | POA: Diagnosis not present

## 2017-01-30 DIAGNOSIS — I482 Chronic atrial fibrillation: Secondary | ICD-10-CM | POA: Diagnosis not present

## 2017-01-30 DIAGNOSIS — I5032 Chronic diastolic (congestive) heart failure: Secondary | ICD-10-CM | POA: Diagnosis not present

## 2017-01-30 DIAGNOSIS — L97912 Non-pressure chronic ulcer of unspecified part of right lower leg with fat layer exposed: Secondary | ICD-10-CM | POA: Diagnosis not present

## 2017-01-30 DIAGNOSIS — I130 Hypertensive heart and chronic kidney disease with heart failure and stage 1 through stage 4 chronic kidney disease, or unspecified chronic kidney disease: Secondary | ICD-10-CM | POA: Diagnosis not present

## 2017-01-30 DIAGNOSIS — N183 Chronic kidney disease, stage 3 (moderate): Secondary | ICD-10-CM | POA: Diagnosis not present

## 2017-01-30 DIAGNOSIS — I350 Nonrheumatic aortic (valve) stenosis: Secondary | ICD-10-CM | POA: Diagnosis not present

## 2017-02-01 DIAGNOSIS — I130 Hypertensive heart and chronic kidney disease with heart failure and stage 1 through stage 4 chronic kidney disease, or unspecified chronic kidney disease: Secondary | ICD-10-CM | POA: Diagnosis not present

## 2017-02-01 DIAGNOSIS — L97912 Non-pressure chronic ulcer of unspecified part of right lower leg with fat layer exposed: Secondary | ICD-10-CM | POA: Diagnosis not present

## 2017-02-01 DIAGNOSIS — I5032 Chronic diastolic (congestive) heart failure: Secondary | ICD-10-CM | POA: Diagnosis not present

## 2017-02-01 DIAGNOSIS — I482 Chronic atrial fibrillation: Secondary | ICD-10-CM | POA: Diagnosis not present

## 2017-02-01 DIAGNOSIS — N183 Chronic kidney disease, stage 3 (moderate): Secondary | ICD-10-CM | POA: Diagnosis not present

## 2017-02-01 DIAGNOSIS — I350 Nonrheumatic aortic (valve) stenosis: Secondary | ICD-10-CM | POA: Diagnosis not present

## 2017-02-04 DIAGNOSIS — I130 Hypertensive heart and chronic kidney disease with heart failure and stage 1 through stage 4 chronic kidney disease, or unspecified chronic kidney disease: Secondary | ICD-10-CM | POA: Diagnosis not present

## 2017-02-04 DIAGNOSIS — N183 Chronic kidney disease, stage 3 (moderate): Secondary | ICD-10-CM | POA: Diagnosis not present

## 2017-02-04 DIAGNOSIS — I482 Chronic atrial fibrillation: Secondary | ICD-10-CM | POA: Diagnosis not present

## 2017-02-04 DIAGNOSIS — I5032 Chronic diastolic (congestive) heart failure: Secondary | ICD-10-CM | POA: Diagnosis not present

## 2017-02-04 DIAGNOSIS — I350 Nonrheumatic aortic (valve) stenosis: Secondary | ICD-10-CM | POA: Diagnosis not present

## 2017-02-04 DIAGNOSIS — L97912 Non-pressure chronic ulcer of unspecified part of right lower leg with fat layer exposed: Secondary | ICD-10-CM | POA: Diagnosis not present

## 2017-02-05 DIAGNOSIS — N183 Chronic kidney disease, stage 3 (moderate): Secondary | ICD-10-CM | POA: Diagnosis not present

## 2017-02-05 DIAGNOSIS — H353223 Exudative age-related macular degeneration, left eye, with inactive scar: Secondary | ICD-10-CM | POA: Diagnosis not present

## 2017-02-05 DIAGNOSIS — I130 Hypertensive heart and chronic kidney disease with heart failure and stage 1 through stage 4 chronic kidney disease, or unspecified chronic kidney disease: Secondary | ICD-10-CM | POA: Diagnosis not present

## 2017-02-05 DIAGNOSIS — H353212 Exudative age-related macular degeneration, right eye, with inactive choroidal neovascularization: Secondary | ICD-10-CM | POA: Diagnosis not present

## 2017-02-05 DIAGNOSIS — I350 Nonrheumatic aortic (valve) stenosis: Secondary | ICD-10-CM | POA: Diagnosis not present

## 2017-02-05 DIAGNOSIS — I482 Chronic atrial fibrillation: Secondary | ICD-10-CM | POA: Diagnosis not present

## 2017-02-05 DIAGNOSIS — L97912 Non-pressure chronic ulcer of unspecified part of right lower leg with fat layer exposed: Secondary | ICD-10-CM | POA: Diagnosis not present

## 2017-02-05 DIAGNOSIS — I5032 Chronic diastolic (congestive) heart failure: Secondary | ICD-10-CM | POA: Diagnosis not present

## 2017-02-06 DIAGNOSIS — I5032 Chronic diastolic (congestive) heart failure: Secondary | ICD-10-CM | POA: Diagnosis not present

## 2017-02-06 DIAGNOSIS — I482 Chronic atrial fibrillation: Secondary | ICD-10-CM | POA: Diagnosis not present

## 2017-02-06 DIAGNOSIS — L97912 Non-pressure chronic ulcer of unspecified part of right lower leg with fat layer exposed: Secondary | ICD-10-CM | POA: Diagnosis not present

## 2017-02-06 DIAGNOSIS — I130 Hypertensive heart and chronic kidney disease with heart failure and stage 1 through stage 4 chronic kidney disease, or unspecified chronic kidney disease: Secondary | ICD-10-CM | POA: Diagnosis not present

## 2017-02-06 DIAGNOSIS — I350 Nonrheumatic aortic (valve) stenosis: Secondary | ICD-10-CM | POA: Diagnosis not present

## 2017-02-06 DIAGNOSIS — N183 Chronic kidney disease, stage 3 (moderate): Secondary | ICD-10-CM | POA: Diagnosis not present

## 2017-02-08 DIAGNOSIS — N183 Chronic kidney disease, stage 3 (moderate): Secondary | ICD-10-CM | POA: Diagnosis not present

## 2017-02-08 DIAGNOSIS — I482 Chronic atrial fibrillation: Secondary | ICD-10-CM | POA: Diagnosis not present

## 2017-02-08 DIAGNOSIS — I350 Nonrheumatic aortic (valve) stenosis: Secondary | ICD-10-CM | POA: Diagnosis not present

## 2017-02-08 DIAGNOSIS — L97912 Non-pressure chronic ulcer of unspecified part of right lower leg with fat layer exposed: Secondary | ICD-10-CM | POA: Diagnosis not present

## 2017-02-08 DIAGNOSIS — I5032 Chronic diastolic (congestive) heart failure: Secondary | ICD-10-CM | POA: Diagnosis not present

## 2017-02-08 DIAGNOSIS — I130 Hypertensive heart and chronic kidney disease with heart failure and stage 1 through stage 4 chronic kidney disease, or unspecified chronic kidney disease: Secondary | ICD-10-CM | POA: Diagnosis not present

## 2017-02-10 DIAGNOSIS — D649 Anemia, unspecified: Secondary | ICD-10-CM | POA: Diagnosis not present

## 2017-02-10 DIAGNOSIS — I350 Nonrheumatic aortic (valve) stenosis: Secondary | ICD-10-CM | POA: Diagnosis not present

## 2017-02-10 DIAGNOSIS — I482 Chronic atrial fibrillation: Secondary | ICD-10-CM | POA: Diagnosis not present

## 2017-02-10 DIAGNOSIS — N183 Chronic kidney disease, stage 3 (moderate): Secondary | ICD-10-CM | POA: Diagnosis not present

## 2017-02-10 DIAGNOSIS — E669 Obesity, unspecified: Secondary | ICD-10-CM | POA: Diagnosis not present

## 2017-02-10 DIAGNOSIS — L97912 Non-pressure chronic ulcer of unspecified part of right lower leg with fat layer exposed: Secondary | ICD-10-CM | POA: Diagnosis not present

## 2017-02-10 DIAGNOSIS — I5032 Chronic diastolic (congestive) heart failure: Secondary | ICD-10-CM | POA: Diagnosis not present

## 2017-02-10 DIAGNOSIS — I130 Hypertensive heart and chronic kidney disease with heart failure and stage 1 through stage 4 chronic kidney disease, or unspecified chronic kidney disease: Secondary | ICD-10-CM | POA: Diagnosis not present

## 2017-02-10 DIAGNOSIS — I25119 Atherosclerotic heart disease of native coronary artery with unspecified angina pectoris: Secondary | ICD-10-CM | POA: Diagnosis not present

## 2017-02-10 DIAGNOSIS — K5790 Diverticulosis of intestine, part unspecified, without perforation or abscess without bleeding: Secondary | ICD-10-CM | POA: Diagnosis not present

## 2017-02-11 DIAGNOSIS — L97912 Non-pressure chronic ulcer of unspecified part of right lower leg with fat layer exposed: Secondary | ICD-10-CM | POA: Diagnosis not present

## 2017-02-11 DIAGNOSIS — I482 Chronic atrial fibrillation: Secondary | ICD-10-CM | POA: Diagnosis not present

## 2017-02-11 DIAGNOSIS — N183 Chronic kidney disease, stage 3 (moderate): Secondary | ICD-10-CM | POA: Diagnosis not present

## 2017-02-11 DIAGNOSIS — I130 Hypertensive heart and chronic kidney disease with heart failure and stage 1 through stage 4 chronic kidney disease, or unspecified chronic kidney disease: Secondary | ICD-10-CM | POA: Diagnosis not present

## 2017-02-11 DIAGNOSIS — I350 Nonrheumatic aortic (valve) stenosis: Secondary | ICD-10-CM | POA: Diagnosis not present

## 2017-02-11 DIAGNOSIS — I5032 Chronic diastolic (congestive) heart failure: Secondary | ICD-10-CM | POA: Diagnosis not present

## 2017-02-12 DIAGNOSIS — I350 Nonrheumatic aortic (valve) stenosis: Secondary | ICD-10-CM | POA: Diagnosis not present

## 2017-02-12 DIAGNOSIS — N183 Chronic kidney disease, stage 3 (moderate): Secondary | ICD-10-CM | POA: Diagnosis not present

## 2017-02-12 DIAGNOSIS — L97912 Non-pressure chronic ulcer of unspecified part of right lower leg with fat layer exposed: Secondary | ICD-10-CM | POA: Diagnosis not present

## 2017-02-12 DIAGNOSIS — I482 Chronic atrial fibrillation: Secondary | ICD-10-CM | POA: Diagnosis not present

## 2017-02-12 DIAGNOSIS — I130 Hypertensive heart and chronic kidney disease with heart failure and stage 1 through stage 4 chronic kidney disease, or unspecified chronic kidney disease: Secondary | ICD-10-CM | POA: Diagnosis not present

## 2017-02-12 DIAGNOSIS — I5032 Chronic diastolic (congestive) heart failure: Secondary | ICD-10-CM | POA: Diagnosis not present

## 2017-02-13 DIAGNOSIS — I130 Hypertensive heart and chronic kidney disease with heart failure and stage 1 through stage 4 chronic kidney disease, or unspecified chronic kidney disease: Secondary | ICD-10-CM | POA: Diagnosis not present

## 2017-02-13 DIAGNOSIS — I5032 Chronic diastolic (congestive) heart failure: Secondary | ICD-10-CM | POA: Diagnosis not present

## 2017-02-13 DIAGNOSIS — I482 Chronic atrial fibrillation: Secondary | ICD-10-CM | POA: Diagnosis not present

## 2017-02-13 DIAGNOSIS — L97912 Non-pressure chronic ulcer of unspecified part of right lower leg with fat layer exposed: Secondary | ICD-10-CM | POA: Diagnosis not present

## 2017-02-13 DIAGNOSIS — N183 Chronic kidney disease, stage 3 (moderate): Secondary | ICD-10-CM | POA: Diagnosis not present

## 2017-02-13 DIAGNOSIS — I350 Nonrheumatic aortic (valve) stenosis: Secondary | ICD-10-CM | POA: Diagnosis not present

## 2017-02-15 DIAGNOSIS — I482 Chronic atrial fibrillation: Secondary | ICD-10-CM | POA: Diagnosis not present

## 2017-02-15 DIAGNOSIS — N183 Chronic kidney disease, stage 3 (moderate): Secondary | ICD-10-CM | POA: Diagnosis not present

## 2017-02-15 DIAGNOSIS — I5032 Chronic diastolic (congestive) heart failure: Secondary | ICD-10-CM | POA: Diagnosis not present

## 2017-02-15 DIAGNOSIS — I350 Nonrheumatic aortic (valve) stenosis: Secondary | ICD-10-CM | POA: Diagnosis not present

## 2017-02-15 DIAGNOSIS — I130 Hypertensive heart and chronic kidney disease with heart failure and stage 1 through stage 4 chronic kidney disease, or unspecified chronic kidney disease: Secondary | ICD-10-CM | POA: Diagnosis not present

## 2017-02-15 DIAGNOSIS — L97912 Non-pressure chronic ulcer of unspecified part of right lower leg with fat layer exposed: Secondary | ICD-10-CM | POA: Diagnosis not present

## 2017-02-17 DIAGNOSIS — N183 Chronic kidney disease, stage 3 (moderate): Secondary | ICD-10-CM | POA: Diagnosis not present

## 2017-02-17 DIAGNOSIS — I130 Hypertensive heart and chronic kidney disease with heart failure and stage 1 through stage 4 chronic kidney disease, or unspecified chronic kidney disease: Secondary | ICD-10-CM | POA: Diagnosis not present

## 2017-02-17 DIAGNOSIS — I482 Chronic atrial fibrillation: Secondary | ICD-10-CM | POA: Diagnosis not present

## 2017-02-17 DIAGNOSIS — I350 Nonrheumatic aortic (valve) stenosis: Secondary | ICD-10-CM | POA: Diagnosis not present

## 2017-02-17 DIAGNOSIS — I5032 Chronic diastolic (congestive) heart failure: Secondary | ICD-10-CM | POA: Diagnosis not present

## 2017-02-17 DIAGNOSIS — L97912 Non-pressure chronic ulcer of unspecified part of right lower leg with fat layer exposed: Secondary | ICD-10-CM | POA: Diagnosis not present

## 2017-02-18 DIAGNOSIS — N183 Chronic kidney disease, stage 3 (moderate): Secondary | ICD-10-CM | POA: Diagnosis not present

## 2017-02-18 DIAGNOSIS — L97912 Non-pressure chronic ulcer of unspecified part of right lower leg with fat layer exposed: Secondary | ICD-10-CM | POA: Diagnosis not present

## 2017-02-18 DIAGNOSIS — I482 Chronic atrial fibrillation: Secondary | ICD-10-CM | POA: Diagnosis not present

## 2017-02-18 DIAGNOSIS — I350 Nonrheumatic aortic (valve) stenosis: Secondary | ICD-10-CM | POA: Diagnosis not present

## 2017-02-18 DIAGNOSIS — I130 Hypertensive heart and chronic kidney disease with heart failure and stage 1 through stage 4 chronic kidney disease, or unspecified chronic kidney disease: Secondary | ICD-10-CM | POA: Diagnosis not present

## 2017-02-18 DIAGNOSIS — I5032 Chronic diastolic (congestive) heart failure: Secondary | ICD-10-CM | POA: Diagnosis not present

## 2017-02-20 DIAGNOSIS — I350 Nonrheumatic aortic (valve) stenosis: Secondary | ICD-10-CM | POA: Diagnosis not present

## 2017-02-20 DIAGNOSIS — L97912 Non-pressure chronic ulcer of unspecified part of right lower leg with fat layer exposed: Secondary | ICD-10-CM | POA: Diagnosis not present

## 2017-02-20 DIAGNOSIS — N183 Chronic kidney disease, stage 3 (moderate): Secondary | ICD-10-CM | POA: Diagnosis not present

## 2017-02-20 DIAGNOSIS — I130 Hypertensive heart and chronic kidney disease with heart failure and stage 1 through stage 4 chronic kidney disease, or unspecified chronic kidney disease: Secondary | ICD-10-CM | POA: Diagnosis not present

## 2017-02-20 DIAGNOSIS — I5032 Chronic diastolic (congestive) heart failure: Secondary | ICD-10-CM | POA: Diagnosis not present

## 2017-02-20 DIAGNOSIS — I482 Chronic atrial fibrillation: Secondary | ICD-10-CM | POA: Diagnosis not present

## 2017-02-22 DIAGNOSIS — N183 Chronic kidney disease, stage 3 (moderate): Secondary | ICD-10-CM | POA: Diagnosis not present

## 2017-02-22 DIAGNOSIS — I350 Nonrheumatic aortic (valve) stenosis: Secondary | ICD-10-CM | POA: Diagnosis not present

## 2017-02-22 DIAGNOSIS — L97912 Non-pressure chronic ulcer of unspecified part of right lower leg with fat layer exposed: Secondary | ICD-10-CM | POA: Diagnosis not present

## 2017-02-22 DIAGNOSIS — I482 Chronic atrial fibrillation: Secondary | ICD-10-CM | POA: Diagnosis not present

## 2017-02-22 DIAGNOSIS — I130 Hypertensive heart and chronic kidney disease with heart failure and stage 1 through stage 4 chronic kidney disease, or unspecified chronic kidney disease: Secondary | ICD-10-CM | POA: Diagnosis not present

## 2017-02-22 DIAGNOSIS — I5032 Chronic diastolic (congestive) heart failure: Secondary | ICD-10-CM | POA: Diagnosis not present

## 2017-02-25 DIAGNOSIS — I5032 Chronic diastolic (congestive) heart failure: Secondary | ICD-10-CM | POA: Diagnosis not present

## 2017-02-25 DIAGNOSIS — I482 Chronic atrial fibrillation: Secondary | ICD-10-CM | POA: Diagnosis not present

## 2017-02-25 DIAGNOSIS — L97912 Non-pressure chronic ulcer of unspecified part of right lower leg with fat layer exposed: Secondary | ICD-10-CM | POA: Diagnosis not present

## 2017-02-25 DIAGNOSIS — N183 Chronic kidney disease, stage 3 (moderate): Secondary | ICD-10-CM | POA: Diagnosis not present

## 2017-02-25 DIAGNOSIS — I130 Hypertensive heart and chronic kidney disease with heart failure and stage 1 through stage 4 chronic kidney disease, or unspecified chronic kidney disease: Secondary | ICD-10-CM | POA: Diagnosis not present

## 2017-02-25 DIAGNOSIS — I350 Nonrheumatic aortic (valve) stenosis: Secondary | ICD-10-CM | POA: Diagnosis not present

## 2017-02-27 DIAGNOSIS — I350 Nonrheumatic aortic (valve) stenosis: Secondary | ICD-10-CM | POA: Diagnosis not present

## 2017-02-27 DIAGNOSIS — I130 Hypertensive heart and chronic kidney disease with heart failure and stage 1 through stage 4 chronic kidney disease, or unspecified chronic kidney disease: Secondary | ICD-10-CM | POA: Diagnosis not present

## 2017-02-27 DIAGNOSIS — I5032 Chronic diastolic (congestive) heart failure: Secondary | ICD-10-CM | POA: Diagnosis not present

## 2017-02-27 DIAGNOSIS — L97912 Non-pressure chronic ulcer of unspecified part of right lower leg with fat layer exposed: Secondary | ICD-10-CM | POA: Diagnosis not present

## 2017-02-27 DIAGNOSIS — N183 Chronic kidney disease, stage 3 (moderate): Secondary | ICD-10-CM | POA: Diagnosis not present

## 2017-02-27 DIAGNOSIS — I482 Chronic atrial fibrillation: Secondary | ICD-10-CM | POA: Diagnosis not present

## 2017-03-01 DIAGNOSIS — I130 Hypertensive heart and chronic kidney disease with heart failure and stage 1 through stage 4 chronic kidney disease, or unspecified chronic kidney disease: Secondary | ICD-10-CM | POA: Diagnosis not present

## 2017-03-01 DIAGNOSIS — N183 Chronic kidney disease, stage 3 (moderate): Secondary | ICD-10-CM | POA: Diagnosis not present

## 2017-03-01 DIAGNOSIS — I5032 Chronic diastolic (congestive) heart failure: Secondary | ICD-10-CM | POA: Diagnosis not present

## 2017-03-01 DIAGNOSIS — I350 Nonrheumatic aortic (valve) stenosis: Secondary | ICD-10-CM | POA: Diagnosis not present

## 2017-03-01 DIAGNOSIS — L97912 Non-pressure chronic ulcer of unspecified part of right lower leg with fat layer exposed: Secondary | ICD-10-CM | POA: Diagnosis not present

## 2017-03-01 DIAGNOSIS — I482 Chronic atrial fibrillation: Secondary | ICD-10-CM | POA: Diagnosis not present

## 2017-03-03 DIAGNOSIS — I5032 Chronic diastolic (congestive) heart failure: Secondary | ICD-10-CM | POA: Diagnosis not present

## 2017-03-03 DIAGNOSIS — I350 Nonrheumatic aortic (valve) stenosis: Secondary | ICD-10-CM | POA: Diagnosis not present

## 2017-03-03 DIAGNOSIS — I130 Hypertensive heart and chronic kidney disease with heart failure and stage 1 through stage 4 chronic kidney disease, or unspecified chronic kidney disease: Secondary | ICD-10-CM | POA: Diagnosis not present

## 2017-03-03 DIAGNOSIS — L97912 Non-pressure chronic ulcer of unspecified part of right lower leg with fat layer exposed: Secondary | ICD-10-CM | POA: Diagnosis not present

## 2017-03-03 DIAGNOSIS — I482 Chronic atrial fibrillation: Secondary | ICD-10-CM | POA: Diagnosis not present

## 2017-03-03 DIAGNOSIS — N183 Chronic kidney disease, stage 3 (moderate): Secondary | ICD-10-CM | POA: Diagnosis not present

## 2017-03-04 DIAGNOSIS — I350 Nonrheumatic aortic (valve) stenosis: Secondary | ICD-10-CM | POA: Diagnosis not present

## 2017-03-04 DIAGNOSIS — I482 Chronic atrial fibrillation: Secondary | ICD-10-CM | POA: Diagnosis not present

## 2017-03-04 DIAGNOSIS — I130 Hypertensive heart and chronic kidney disease with heart failure and stage 1 through stage 4 chronic kidney disease, or unspecified chronic kidney disease: Secondary | ICD-10-CM | POA: Diagnosis not present

## 2017-03-04 DIAGNOSIS — L97912 Non-pressure chronic ulcer of unspecified part of right lower leg with fat layer exposed: Secondary | ICD-10-CM | POA: Diagnosis not present

## 2017-03-04 DIAGNOSIS — N183 Chronic kidney disease, stage 3 (moderate): Secondary | ICD-10-CM | POA: Diagnosis not present

## 2017-03-04 DIAGNOSIS — I5032 Chronic diastolic (congestive) heart failure: Secondary | ICD-10-CM | POA: Diagnosis not present

## 2017-03-05 DIAGNOSIS — I130 Hypertensive heart and chronic kidney disease with heart failure and stage 1 through stage 4 chronic kidney disease, or unspecified chronic kidney disease: Secondary | ICD-10-CM | POA: Diagnosis not present

## 2017-03-05 DIAGNOSIS — I350 Nonrheumatic aortic (valve) stenosis: Secondary | ICD-10-CM | POA: Diagnosis not present

## 2017-03-05 DIAGNOSIS — L97912 Non-pressure chronic ulcer of unspecified part of right lower leg with fat layer exposed: Secondary | ICD-10-CM | POA: Diagnosis not present

## 2017-03-05 DIAGNOSIS — I5032 Chronic diastolic (congestive) heart failure: Secondary | ICD-10-CM | POA: Diagnosis not present

## 2017-03-05 DIAGNOSIS — I482 Chronic atrial fibrillation: Secondary | ICD-10-CM | POA: Diagnosis not present

## 2017-03-05 DIAGNOSIS — N183 Chronic kidney disease, stage 3 (moderate): Secondary | ICD-10-CM | POA: Diagnosis not present

## 2017-03-06 DIAGNOSIS — I482 Chronic atrial fibrillation: Secondary | ICD-10-CM | POA: Diagnosis not present

## 2017-03-06 DIAGNOSIS — I350 Nonrheumatic aortic (valve) stenosis: Secondary | ICD-10-CM | POA: Diagnosis not present

## 2017-03-06 DIAGNOSIS — L97912 Non-pressure chronic ulcer of unspecified part of right lower leg with fat layer exposed: Secondary | ICD-10-CM | POA: Diagnosis not present

## 2017-03-06 DIAGNOSIS — I130 Hypertensive heart and chronic kidney disease with heart failure and stage 1 through stage 4 chronic kidney disease, or unspecified chronic kidney disease: Secondary | ICD-10-CM | POA: Diagnosis not present

## 2017-03-06 DIAGNOSIS — N183 Chronic kidney disease, stage 3 (moderate): Secondary | ICD-10-CM | POA: Diagnosis not present

## 2017-03-06 DIAGNOSIS — I5032 Chronic diastolic (congestive) heart failure: Secondary | ICD-10-CM | POA: Diagnosis not present

## 2017-03-07 DIAGNOSIS — I482 Chronic atrial fibrillation: Secondary | ICD-10-CM | POA: Diagnosis not present

## 2017-03-07 DIAGNOSIS — I350 Nonrheumatic aortic (valve) stenosis: Secondary | ICD-10-CM | POA: Diagnosis not present

## 2017-03-07 DIAGNOSIS — N183 Chronic kidney disease, stage 3 (moderate): Secondary | ICD-10-CM | POA: Diagnosis not present

## 2017-03-07 DIAGNOSIS — I130 Hypertensive heart and chronic kidney disease with heart failure and stage 1 through stage 4 chronic kidney disease, or unspecified chronic kidney disease: Secondary | ICD-10-CM | POA: Diagnosis not present

## 2017-03-07 DIAGNOSIS — I5032 Chronic diastolic (congestive) heart failure: Secondary | ICD-10-CM | POA: Diagnosis not present

## 2017-03-07 DIAGNOSIS — L97912 Non-pressure chronic ulcer of unspecified part of right lower leg with fat layer exposed: Secondary | ICD-10-CM | POA: Diagnosis not present

## 2017-03-08 DIAGNOSIS — N183 Chronic kidney disease, stage 3 (moderate): Secondary | ICD-10-CM | POA: Diagnosis not present

## 2017-03-08 DIAGNOSIS — I5032 Chronic diastolic (congestive) heart failure: Secondary | ICD-10-CM | POA: Diagnosis not present

## 2017-03-08 DIAGNOSIS — I350 Nonrheumatic aortic (valve) stenosis: Secondary | ICD-10-CM | POA: Diagnosis not present

## 2017-03-08 DIAGNOSIS — I482 Chronic atrial fibrillation: Secondary | ICD-10-CM | POA: Diagnosis not present

## 2017-03-08 DIAGNOSIS — I130 Hypertensive heart and chronic kidney disease with heart failure and stage 1 through stage 4 chronic kidney disease, or unspecified chronic kidney disease: Secondary | ICD-10-CM | POA: Diagnosis not present

## 2017-03-08 DIAGNOSIS — L97912 Non-pressure chronic ulcer of unspecified part of right lower leg with fat layer exposed: Secondary | ICD-10-CM | POA: Diagnosis not present

## 2017-03-11 DIAGNOSIS — L97912 Non-pressure chronic ulcer of unspecified part of right lower leg with fat layer exposed: Secondary | ICD-10-CM | POA: Diagnosis not present

## 2017-03-11 DIAGNOSIS — I5032 Chronic diastolic (congestive) heart failure: Secondary | ICD-10-CM | POA: Diagnosis not present

## 2017-03-11 DIAGNOSIS — I482 Chronic atrial fibrillation: Secondary | ICD-10-CM | POA: Diagnosis not present

## 2017-03-11 DIAGNOSIS — I130 Hypertensive heart and chronic kidney disease with heart failure and stage 1 through stage 4 chronic kidney disease, or unspecified chronic kidney disease: Secondary | ICD-10-CM | POA: Diagnosis not present

## 2017-03-11 DIAGNOSIS — I350 Nonrheumatic aortic (valve) stenosis: Secondary | ICD-10-CM | POA: Diagnosis not present

## 2017-03-11 DIAGNOSIS — N183 Chronic kidney disease, stage 3 (moderate): Secondary | ICD-10-CM | POA: Diagnosis not present

## 2017-03-12 DIAGNOSIS — I130 Hypertensive heart and chronic kidney disease with heart failure and stage 1 through stage 4 chronic kidney disease, or unspecified chronic kidney disease: Secondary | ICD-10-CM | POA: Diagnosis not present

## 2017-03-12 DIAGNOSIS — N183 Chronic kidney disease, stage 3 (moderate): Secondary | ICD-10-CM | POA: Diagnosis not present

## 2017-03-12 DIAGNOSIS — I482 Chronic atrial fibrillation: Secondary | ICD-10-CM | POA: Diagnosis not present

## 2017-03-12 DIAGNOSIS — I25119 Atherosclerotic heart disease of native coronary artery with unspecified angina pectoris: Secondary | ICD-10-CM | POA: Diagnosis not present

## 2017-03-12 DIAGNOSIS — K5790 Diverticulosis of intestine, part unspecified, without perforation or abscess without bleeding: Secondary | ICD-10-CM | POA: Diagnosis not present

## 2017-03-12 DIAGNOSIS — E669 Obesity, unspecified: Secondary | ICD-10-CM | POA: Diagnosis not present

## 2017-03-12 DIAGNOSIS — Z87440 Personal history of urinary (tract) infections: Secondary | ICD-10-CM | POA: Diagnosis not present

## 2017-03-12 DIAGNOSIS — L97912 Non-pressure chronic ulcer of unspecified part of right lower leg with fat layer exposed: Secondary | ICD-10-CM | POA: Diagnosis not present

## 2017-03-12 DIAGNOSIS — I5032 Chronic diastolic (congestive) heart failure: Secondary | ICD-10-CM | POA: Diagnosis not present

## 2017-03-12 DIAGNOSIS — D649 Anemia, unspecified: Secondary | ICD-10-CM | POA: Diagnosis not present

## 2017-03-12 DIAGNOSIS — I350 Nonrheumatic aortic (valve) stenosis: Secondary | ICD-10-CM | POA: Diagnosis not present

## 2017-03-13 DIAGNOSIS — I5032 Chronic diastolic (congestive) heart failure: Secondary | ICD-10-CM | POA: Diagnosis not present

## 2017-03-13 DIAGNOSIS — I350 Nonrheumatic aortic (valve) stenosis: Secondary | ICD-10-CM | POA: Diagnosis not present

## 2017-03-13 DIAGNOSIS — L97912 Non-pressure chronic ulcer of unspecified part of right lower leg with fat layer exposed: Secondary | ICD-10-CM | POA: Diagnosis not present

## 2017-03-13 DIAGNOSIS — I130 Hypertensive heart and chronic kidney disease with heart failure and stage 1 through stage 4 chronic kidney disease, or unspecified chronic kidney disease: Secondary | ICD-10-CM | POA: Diagnosis not present

## 2017-03-13 DIAGNOSIS — N183 Chronic kidney disease, stage 3 (moderate): Secondary | ICD-10-CM | POA: Diagnosis not present

## 2017-03-13 DIAGNOSIS — I482 Chronic atrial fibrillation: Secondary | ICD-10-CM | POA: Diagnosis not present

## 2017-03-15 DIAGNOSIS — I350 Nonrheumatic aortic (valve) stenosis: Secondary | ICD-10-CM | POA: Diagnosis not present

## 2017-03-15 DIAGNOSIS — I5032 Chronic diastolic (congestive) heart failure: Secondary | ICD-10-CM | POA: Diagnosis not present

## 2017-03-15 DIAGNOSIS — L97912 Non-pressure chronic ulcer of unspecified part of right lower leg with fat layer exposed: Secondary | ICD-10-CM | POA: Diagnosis not present

## 2017-03-15 DIAGNOSIS — I482 Chronic atrial fibrillation: Secondary | ICD-10-CM | POA: Diagnosis not present

## 2017-03-15 DIAGNOSIS — N183 Chronic kidney disease, stage 3 (moderate): Secondary | ICD-10-CM | POA: Diagnosis not present

## 2017-03-15 DIAGNOSIS — I130 Hypertensive heart and chronic kidney disease with heart failure and stage 1 through stage 4 chronic kidney disease, or unspecified chronic kidney disease: Secondary | ICD-10-CM | POA: Diagnosis not present

## 2017-03-17 DIAGNOSIS — L97912 Non-pressure chronic ulcer of unspecified part of right lower leg with fat layer exposed: Secondary | ICD-10-CM | POA: Diagnosis not present

## 2017-03-17 DIAGNOSIS — I350 Nonrheumatic aortic (valve) stenosis: Secondary | ICD-10-CM | POA: Diagnosis not present

## 2017-03-17 DIAGNOSIS — I5032 Chronic diastolic (congestive) heart failure: Secondary | ICD-10-CM | POA: Diagnosis not present

## 2017-03-17 DIAGNOSIS — I130 Hypertensive heart and chronic kidney disease with heart failure and stage 1 through stage 4 chronic kidney disease, or unspecified chronic kidney disease: Secondary | ICD-10-CM | POA: Diagnosis not present

## 2017-03-17 DIAGNOSIS — I482 Chronic atrial fibrillation: Secondary | ICD-10-CM | POA: Diagnosis not present

## 2017-03-17 DIAGNOSIS — N183 Chronic kidney disease, stage 3 (moderate): Secondary | ICD-10-CM | POA: Diagnosis not present

## 2017-03-18 DIAGNOSIS — L97912 Non-pressure chronic ulcer of unspecified part of right lower leg with fat layer exposed: Secondary | ICD-10-CM | POA: Diagnosis not present

## 2017-03-18 DIAGNOSIS — I350 Nonrheumatic aortic (valve) stenosis: Secondary | ICD-10-CM | POA: Diagnosis not present

## 2017-03-18 DIAGNOSIS — I5032 Chronic diastolic (congestive) heart failure: Secondary | ICD-10-CM | POA: Diagnosis not present

## 2017-03-18 DIAGNOSIS — N183 Chronic kidney disease, stage 3 (moderate): Secondary | ICD-10-CM | POA: Diagnosis not present

## 2017-03-18 DIAGNOSIS — I130 Hypertensive heart and chronic kidney disease with heart failure and stage 1 through stage 4 chronic kidney disease, or unspecified chronic kidney disease: Secondary | ICD-10-CM | POA: Diagnosis not present

## 2017-03-18 DIAGNOSIS — I482 Chronic atrial fibrillation: Secondary | ICD-10-CM | POA: Diagnosis not present

## 2017-03-19 ENCOUNTER — Ambulatory Visit: Payer: Medicare Other | Admitting: Podiatry

## 2017-03-20 DIAGNOSIS — L97912 Non-pressure chronic ulcer of unspecified part of right lower leg with fat layer exposed: Secondary | ICD-10-CM | POA: Diagnosis not present

## 2017-03-20 DIAGNOSIS — I130 Hypertensive heart and chronic kidney disease with heart failure and stage 1 through stage 4 chronic kidney disease, or unspecified chronic kidney disease: Secondary | ICD-10-CM | POA: Diagnosis not present

## 2017-03-20 DIAGNOSIS — N183 Chronic kidney disease, stage 3 (moderate): Secondary | ICD-10-CM | POA: Diagnosis not present

## 2017-03-20 DIAGNOSIS — I482 Chronic atrial fibrillation: Secondary | ICD-10-CM | POA: Diagnosis not present

## 2017-03-20 DIAGNOSIS — I350 Nonrheumatic aortic (valve) stenosis: Secondary | ICD-10-CM | POA: Diagnosis not present

## 2017-03-20 DIAGNOSIS — I5032 Chronic diastolic (congestive) heart failure: Secondary | ICD-10-CM | POA: Diagnosis not present

## 2017-03-22 DIAGNOSIS — I482 Chronic atrial fibrillation: Secondary | ICD-10-CM | POA: Diagnosis not present

## 2017-03-22 DIAGNOSIS — N183 Chronic kidney disease, stage 3 (moderate): Secondary | ICD-10-CM | POA: Diagnosis not present

## 2017-03-22 DIAGNOSIS — I350 Nonrheumatic aortic (valve) stenosis: Secondary | ICD-10-CM | POA: Diagnosis not present

## 2017-03-22 DIAGNOSIS — L97912 Non-pressure chronic ulcer of unspecified part of right lower leg with fat layer exposed: Secondary | ICD-10-CM | POA: Diagnosis not present

## 2017-03-22 DIAGNOSIS — I130 Hypertensive heart and chronic kidney disease with heart failure and stage 1 through stage 4 chronic kidney disease, or unspecified chronic kidney disease: Secondary | ICD-10-CM | POA: Diagnosis not present

## 2017-03-22 DIAGNOSIS — I5032 Chronic diastolic (congestive) heart failure: Secondary | ICD-10-CM | POA: Diagnosis not present

## 2017-03-25 DIAGNOSIS — I130 Hypertensive heart and chronic kidney disease with heart failure and stage 1 through stage 4 chronic kidney disease, or unspecified chronic kidney disease: Secondary | ICD-10-CM | POA: Diagnosis not present

## 2017-03-25 DIAGNOSIS — L97912 Non-pressure chronic ulcer of unspecified part of right lower leg with fat layer exposed: Secondary | ICD-10-CM | POA: Diagnosis not present

## 2017-03-25 DIAGNOSIS — I350 Nonrheumatic aortic (valve) stenosis: Secondary | ICD-10-CM | POA: Diagnosis not present

## 2017-03-25 DIAGNOSIS — I5032 Chronic diastolic (congestive) heart failure: Secondary | ICD-10-CM | POA: Diagnosis not present

## 2017-03-25 DIAGNOSIS — N183 Chronic kidney disease, stage 3 (moderate): Secondary | ICD-10-CM | POA: Diagnosis not present

## 2017-03-25 DIAGNOSIS — I482 Chronic atrial fibrillation: Secondary | ICD-10-CM | POA: Diagnosis not present

## 2017-03-27 DIAGNOSIS — I5032 Chronic diastolic (congestive) heart failure: Secondary | ICD-10-CM | POA: Diagnosis not present

## 2017-03-27 DIAGNOSIS — L97912 Non-pressure chronic ulcer of unspecified part of right lower leg with fat layer exposed: Secondary | ICD-10-CM | POA: Diagnosis not present

## 2017-03-27 DIAGNOSIS — N183 Chronic kidney disease, stage 3 (moderate): Secondary | ICD-10-CM | POA: Diagnosis not present

## 2017-03-27 DIAGNOSIS — I130 Hypertensive heart and chronic kidney disease with heart failure and stage 1 through stage 4 chronic kidney disease, or unspecified chronic kidney disease: Secondary | ICD-10-CM | POA: Diagnosis not present

## 2017-03-27 DIAGNOSIS — I350 Nonrheumatic aortic (valve) stenosis: Secondary | ICD-10-CM | POA: Diagnosis not present

## 2017-03-27 DIAGNOSIS — I482 Chronic atrial fibrillation: Secondary | ICD-10-CM | POA: Diagnosis not present

## 2017-03-28 ENCOUNTER — Encounter: Payer: Self-pay | Admitting: Podiatry

## 2017-03-28 ENCOUNTER — Ambulatory Visit (INDEPENDENT_AMBULATORY_CARE_PROVIDER_SITE_OTHER): Payer: Medicare Other | Admitting: Podiatry

## 2017-03-28 DIAGNOSIS — M79672 Pain in left foot: Secondary | ICD-10-CM

## 2017-03-28 DIAGNOSIS — M79671 Pain in right foot: Secondary | ICD-10-CM

## 2017-03-28 DIAGNOSIS — L600 Ingrowing nail: Secondary | ICD-10-CM | POA: Diagnosis not present

## 2017-03-28 DIAGNOSIS — B351 Tinea unguium: Secondary | ICD-10-CM | POA: Diagnosis not present

## 2017-03-28 NOTE — Patient Instructions (Signed)
Seen for hypertrophic nails. All nails debrided. Return in 3 months or as needed.  

## 2017-03-28 NOTE — Progress Notes (Signed)
SUBJECTIVE: 83 y.o. year old female presents via wheel chair complaining of painful toe. Both big toes are painful.Having hard of hearing.   OBJECTIVE: DERMATOLOGIC EXAMINATION: Painful ingrown nails both great toes without open infection or drainage. Hypertrophic nails x 10. VASCULAR EXAMINATION OF LOWER LIMBS: Pedal pulses: All pedal pulses are palpable with normal pulsation. Minimum edema noted. Temperature gradient from tibial crest to dorsum of foot is within normal bilateral. NEUROLOGIC EXAMINATION OF THE LOWER LIMBS: All epicritic and tactile sensations grossly intact. MUSCULOSKELETAL EXAMINATION: Mild digital contracture lesser digits bilateral.  ASSESSMENT: Onychomycosis x 10. Ingrown nail both great toes. Pain in lower limb.  Plan: All nails debrided. Pain was relieved. Return in 3 months

## 2017-03-29 DIAGNOSIS — I5032 Chronic diastolic (congestive) heart failure: Secondary | ICD-10-CM | POA: Diagnosis not present

## 2017-03-29 DIAGNOSIS — I482 Chronic atrial fibrillation: Secondary | ICD-10-CM | POA: Diagnosis not present

## 2017-03-29 DIAGNOSIS — I350 Nonrheumatic aortic (valve) stenosis: Secondary | ICD-10-CM | POA: Diagnosis not present

## 2017-03-29 DIAGNOSIS — N183 Chronic kidney disease, stage 3 (moderate): Secondary | ICD-10-CM | POA: Diagnosis not present

## 2017-03-29 DIAGNOSIS — I130 Hypertensive heart and chronic kidney disease with heart failure and stage 1 through stage 4 chronic kidney disease, or unspecified chronic kidney disease: Secondary | ICD-10-CM | POA: Diagnosis not present

## 2017-03-29 DIAGNOSIS — L97912 Non-pressure chronic ulcer of unspecified part of right lower leg with fat layer exposed: Secondary | ICD-10-CM | POA: Diagnosis not present

## 2017-04-01 DIAGNOSIS — I5032 Chronic diastolic (congestive) heart failure: Secondary | ICD-10-CM | POA: Diagnosis not present

## 2017-04-01 DIAGNOSIS — L97912 Non-pressure chronic ulcer of unspecified part of right lower leg with fat layer exposed: Secondary | ICD-10-CM | POA: Diagnosis not present

## 2017-04-01 DIAGNOSIS — N183 Chronic kidney disease, stage 3 (moderate): Secondary | ICD-10-CM | POA: Diagnosis not present

## 2017-04-01 DIAGNOSIS — I130 Hypertensive heart and chronic kidney disease with heart failure and stage 1 through stage 4 chronic kidney disease, or unspecified chronic kidney disease: Secondary | ICD-10-CM | POA: Diagnosis not present

## 2017-04-01 DIAGNOSIS — I482 Chronic atrial fibrillation: Secondary | ICD-10-CM | POA: Diagnosis not present

## 2017-04-01 DIAGNOSIS — I350 Nonrheumatic aortic (valve) stenosis: Secondary | ICD-10-CM | POA: Diagnosis not present

## 2017-04-02 DIAGNOSIS — H01001 Unspecified blepharitis right upper eyelid: Secondary | ICD-10-CM | POA: Diagnosis not present

## 2017-04-02 DIAGNOSIS — H353221 Exudative age-related macular degeneration, left eye, with active choroidal neovascularization: Secondary | ICD-10-CM | POA: Diagnosis not present

## 2017-04-02 DIAGNOSIS — H01002 Unspecified blepharitis right lower eyelid: Secondary | ICD-10-CM | POA: Diagnosis not present

## 2017-04-02 DIAGNOSIS — H26492 Other secondary cataract, left eye: Secondary | ICD-10-CM | POA: Diagnosis not present

## 2017-04-02 DIAGNOSIS — H524 Presbyopia: Secondary | ICD-10-CM | POA: Diagnosis not present

## 2017-04-03 DIAGNOSIS — N183 Chronic kidney disease, stage 3 (moderate): Secondary | ICD-10-CM | POA: Diagnosis not present

## 2017-04-03 DIAGNOSIS — I5032 Chronic diastolic (congestive) heart failure: Secondary | ICD-10-CM | POA: Diagnosis not present

## 2017-04-03 DIAGNOSIS — I130 Hypertensive heart and chronic kidney disease with heart failure and stage 1 through stage 4 chronic kidney disease, or unspecified chronic kidney disease: Secondary | ICD-10-CM | POA: Diagnosis not present

## 2017-04-03 DIAGNOSIS — L97912 Non-pressure chronic ulcer of unspecified part of right lower leg with fat layer exposed: Secondary | ICD-10-CM | POA: Diagnosis not present

## 2017-04-03 DIAGNOSIS — I482 Chronic atrial fibrillation: Secondary | ICD-10-CM | POA: Diagnosis not present

## 2017-04-03 DIAGNOSIS — I350 Nonrheumatic aortic (valve) stenosis: Secondary | ICD-10-CM | POA: Diagnosis not present

## 2017-04-05 DIAGNOSIS — I5032 Chronic diastolic (congestive) heart failure: Secondary | ICD-10-CM | POA: Diagnosis not present

## 2017-04-05 DIAGNOSIS — I350 Nonrheumatic aortic (valve) stenosis: Secondary | ICD-10-CM | POA: Diagnosis not present

## 2017-04-05 DIAGNOSIS — I130 Hypertensive heart and chronic kidney disease with heart failure and stage 1 through stage 4 chronic kidney disease, or unspecified chronic kidney disease: Secondary | ICD-10-CM | POA: Diagnosis not present

## 2017-04-05 DIAGNOSIS — L97912 Non-pressure chronic ulcer of unspecified part of right lower leg with fat layer exposed: Secondary | ICD-10-CM | POA: Diagnosis not present

## 2017-04-05 DIAGNOSIS — N183 Chronic kidney disease, stage 3 (moderate): Secondary | ICD-10-CM | POA: Diagnosis not present

## 2017-04-05 DIAGNOSIS — I482 Chronic atrial fibrillation: Secondary | ICD-10-CM | POA: Diagnosis not present

## 2017-04-07 DIAGNOSIS — I482 Chronic atrial fibrillation: Secondary | ICD-10-CM | POA: Diagnosis not present

## 2017-04-07 DIAGNOSIS — N183 Chronic kidney disease, stage 3 (moderate): Secondary | ICD-10-CM | POA: Diagnosis not present

## 2017-04-07 DIAGNOSIS — I5032 Chronic diastolic (congestive) heart failure: Secondary | ICD-10-CM | POA: Diagnosis not present

## 2017-04-07 DIAGNOSIS — I350 Nonrheumatic aortic (valve) stenosis: Secondary | ICD-10-CM | POA: Diagnosis not present

## 2017-04-07 DIAGNOSIS — I130 Hypertensive heart and chronic kidney disease with heart failure and stage 1 through stage 4 chronic kidney disease, or unspecified chronic kidney disease: Secondary | ICD-10-CM | POA: Diagnosis not present

## 2017-04-07 DIAGNOSIS — L97912 Non-pressure chronic ulcer of unspecified part of right lower leg with fat layer exposed: Secondary | ICD-10-CM | POA: Diagnosis not present

## 2017-04-08 DIAGNOSIS — I482 Chronic atrial fibrillation: Secondary | ICD-10-CM | POA: Diagnosis not present

## 2017-04-08 DIAGNOSIS — I350 Nonrheumatic aortic (valve) stenosis: Secondary | ICD-10-CM | POA: Diagnosis not present

## 2017-04-08 DIAGNOSIS — N183 Chronic kidney disease, stage 3 (moderate): Secondary | ICD-10-CM | POA: Diagnosis not present

## 2017-04-08 DIAGNOSIS — L97912 Non-pressure chronic ulcer of unspecified part of right lower leg with fat layer exposed: Secondary | ICD-10-CM | POA: Diagnosis not present

## 2017-04-08 DIAGNOSIS — I5032 Chronic diastolic (congestive) heart failure: Secondary | ICD-10-CM | POA: Diagnosis not present

## 2017-04-08 DIAGNOSIS — I130 Hypertensive heart and chronic kidney disease with heart failure and stage 1 through stage 4 chronic kidney disease, or unspecified chronic kidney disease: Secondary | ICD-10-CM | POA: Diagnosis not present

## 2017-04-10 DIAGNOSIS — L97912 Non-pressure chronic ulcer of unspecified part of right lower leg with fat layer exposed: Secondary | ICD-10-CM | POA: Diagnosis not present

## 2017-04-10 DIAGNOSIS — I130 Hypertensive heart and chronic kidney disease with heart failure and stage 1 through stage 4 chronic kidney disease, or unspecified chronic kidney disease: Secondary | ICD-10-CM | POA: Diagnosis not present

## 2017-04-10 DIAGNOSIS — N183 Chronic kidney disease, stage 3 (moderate): Secondary | ICD-10-CM | POA: Diagnosis not present

## 2017-04-10 DIAGNOSIS — I350 Nonrheumatic aortic (valve) stenosis: Secondary | ICD-10-CM | POA: Diagnosis not present

## 2017-04-10 DIAGNOSIS — I482 Chronic atrial fibrillation: Secondary | ICD-10-CM | POA: Diagnosis not present

## 2017-04-10 DIAGNOSIS — I5032 Chronic diastolic (congestive) heart failure: Secondary | ICD-10-CM | POA: Diagnosis not present

## 2017-04-12 DIAGNOSIS — I482 Chronic atrial fibrillation: Secondary | ICD-10-CM | POA: Diagnosis not present

## 2017-04-12 DIAGNOSIS — I25119 Atherosclerotic heart disease of native coronary artery with unspecified angina pectoris: Secondary | ICD-10-CM | POA: Diagnosis not present

## 2017-04-12 DIAGNOSIS — Z87440 Personal history of urinary (tract) infections: Secondary | ICD-10-CM | POA: Diagnosis not present

## 2017-04-12 DIAGNOSIS — K5790 Diverticulosis of intestine, part unspecified, without perforation or abscess without bleeding: Secondary | ICD-10-CM | POA: Diagnosis not present

## 2017-04-12 DIAGNOSIS — E669 Obesity, unspecified: Secondary | ICD-10-CM | POA: Diagnosis not present

## 2017-04-12 DIAGNOSIS — I350 Nonrheumatic aortic (valve) stenosis: Secondary | ICD-10-CM | POA: Diagnosis not present

## 2017-04-12 DIAGNOSIS — D649 Anemia, unspecified: Secondary | ICD-10-CM | POA: Diagnosis not present

## 2017-04-12 DIAGNOSIS — I130 Hypertensive heart and chronic kidney disease with heart failure and stage 1 through stage 4 chronic kidney disease, or unspecified chronic kidney disease: Secondary | ICD-10-CM | POA: Diagnosis not present

## 2017-04-12 DIAGNOSIS — I5032 Chronic diastolic (congestive) heart failure: Secondary | ICD-10-CM | POA: Diagnosis not present

## 2017-04-12 DIAGNOSIS — N183 Chronic kidney disease, stage 3 (moderate): Secondary | ICD-10-CM | POA: Diagnosis not present

## 2017-04-12 DIAGNOSIS — L97912 Non-pressure chronic ulcer of unspecified part of right lower leg with fat layer exposed: Secondary | ICD-10-CM | POA: Diagnosis not present

## 2017-04-15 DIAGNOSIS — L97912 Non-pressure chronic ulcer of unspecified part of right lower leg with fat layer exposed: Secondary | ICD-10-CM | POA: Diagnosis not present

## 2017-04-15 DIAGNOSIS — I482 Chronic atrial fibrillation: Secondary | ICD-10-CM | POA: Diagnosis not present

## 2017-04-15 DIAGNOSIS — I130 Hypertensive heart and chronic kidney disease with heart failure and stage 1 through stage 4 chronic kidney disease, or unspecified chronic kidney disease: Secondary | ICD-10-CM | POA: Diagnosis not present

## 2017-04-15 DIAGNOSIS — I350 Nonrheumatic aortic (valve) stenosis: Secondary | ICD-10-CM | POA: Diagnosis not present

## 2017-04-15 DIAGNOSIS — I5032 Chronic diastolic (congestive) heart failure: Secondary | ICD-10-CM | POA: Diagnosis not present

## 2017-04-15 DIAGNOSIS — N183 Chronic kidney disease, stage 3 (moderate): Secondary | ICD-10-CM | POA: Diagnosis not present

## 2017-04-17 DIAGNOSIS — H353223 Exudative age-related macular degeneration, left eye, with inactive scar: Secondary | ICD-10-CM | POA: Diagnosis not present

## 2017-04-17 DIAGNOSIS — H35433 Paving stone degeneration of retina, bilateral: Secondary | ICD-10-CM | POA: Diagnosis not present

## 2017-04-17 DIAGNOSIS — L97912 Non-pressure chronic ulcer of unspecified part of right lower leg with fat layer exposed: Secondary | ICD-10-CM | POA: Diagnosis not present

## 2017-04-17 DIAGNOSIS — I350 Nonrheumatic aortic (valve) stenosis: Secondary | ICD-10-CM | POA: Diagnosis not present

## 2017-04-17 DIAGNOSIS — H353212 Exudative age-related macular degeneration, right eye, with inactive choroidal neovascularization: Secondary | ICD-10-CM | POA: Diagnosis not present

## 2017-04-17 DIAGNOSIS — I130 Hypertensive heart and chronic kidney disease with heart failure and stage 1 through stage 4 chronic kidney disease, or unspecified chronic kidney disease: Secondary | ICD-10-CM | POA: Diagnosis not present

## 2017-04-17 DIAGNOSIS — I5032 Chronic diastolic (congestive) heart failure: Secondary | ICD-10-CM | POA: Diagnosis not present

## 2017-04-17 DIAGNOSIS — N183 Chronic kidney disease, stage 3 (moderate): Secondary | ICD-10-CM | POA: Diagnosis not present

## 2017-04-17 DIAGNOSIS — I482 Chronic atrial fibrillation: Secondary | ICD-10-CM | POA: Diagnosis not present

## 2017-04-17 DIAGNOSIS — H15833 Staphyloma posticum, bilateral: Secondary | ICD-10-CM | POA: Diagnosis not present

## 2017-04-19 DIAGNOSIS — I482 Chronic atrial fibrillation: Secondary | ICD-10-CM | POA: Diagnosis not present

## 2017-04-19 DIAGNOSIS — N183 Chronic kidney disease, stage 3 (moderate): Secondary | ICD-10-CM | POA: Diagnosis not present

## 2017-04-19 DIAGNOSIS — L97912 Non-pressure chronic ulcer of unspecified part of right lower leg with fat layer exposed: Secondary | ICD-10-CM | POA: Diagnosis not present

## 2017-04-19 DIAGNOSIS — I5032 Chronic diastolic (congestive) heart failure: Secondary | ICD-10-CM | POA: Diagnosis not present

## 2017-04-19 DIAGNOSIS — I350 Nonrheumatic aortic (valve) stenosis: Secondary | ICD-10-CM | POA: Diagnosis not present

## 2017-04-19 DIAGNOSIS — I130 Hypertensive heart and chronic kidney disease with heart failure and stage 1 through stage 4 chronic kidney disease, or unspecified chronic kidney disease: Secondary | ICD-10-CM | POA: Diagnosis not present

## 2017-04-22 DIAGNOSIS — L97912 Non-pressure chronic ulcer of unspecified part of right lower leg with fat layer exposed: Secondary | ICD-10-CM | POA: Diagnosis not present

## 2017-04-22 DIAGNOSIS — N183 Chronic kidney disease, stage 3 (moderate): Secondary | ICD-10-CM | POA: Diagnosis not present

## 2017-04-22 DIAGNOSIS — I350 Nonrheumatic aortic (valve) stenosis: Secondary | ICD-10-CM | POA: Diagnosis not present

## 2017-04-22 DIAGNOSIS — I482 Chronic atrial fibrillation: Secondary | ICD-10-CM | POA: Diagnosis not present

## 2017-04-22 DIAGNOSIS — I130 Hypertensive heart and chronic kidney disease with heart failure and stage 1 through stage 4 chronic kidney disease, or unspecified chronic kidney disease: Secondary | ICD-10-CM | POA: Diagnosis not present

## 2017-04-22 DIAGNOSIS — I5032 Chronic diastolic (congestive) heart failure: Secondary | ICD-10-CM | POA: Diagnosis not present

## 2017-04-24 DIAGNOSIS — L97912 Non-pressure chronic ulcer of unspecified part of right lower leg with fat layer exposed: Secondary | ICD-10-CM | POA: Diagnosis not present

## 2017-04-24 DIAGNOSIS — I130 Hypertensive heart and chronic kidney disease with heart failure and stage 1 through stage 4 chronic kidney disease, or unspecified chronic kidney disease: Secondary | ICD-10-CM | POA: Diagnosis not present

## 2017-04-24 DIAGNOSIS — I482 Chronic atrial fibrillation: Secondary | ICD-10-CM | POA: Diagnosis not present

## 2017-04-24 DIAGNOSIS — I350 Nonrheumatic aortic (valve) stenosis: Secondary | ICD-10-CM | POA: Diagnosis not present

## 2017-04-24 DIAGNOSIS — N183 Chronic kidney disease, stage 3 (moderate): Secondary | ICD-10-CM | POA: Diagnosis not present

## 2017-04-24 DIAGNOSIS — I5032 Chronic diastolic (congestive) heart failure: Secondary | ICD-10-CM | POA: Diagnosis not present

## 2017-04-26 DIAGNOSIS — I5032 Chronic diastolic (congestive) heart failure: Secondary | ICD-10-CM | POA: Diagnosis not present

## 2017-04-26 DIAGNOSIS — I350 Nonrheumatic aortic (valve) stenosis: Secondary | ICD-10-CM | POA: Diagnosis not present

## 2017-04-26 DIAGNOSIS — I482 Chronic atrial fibrillation: Secondary | ICD-10-CM | POA: Diagnosis not present

## 2017-04-26 DIAGNOSIS — L97912 Non-pressure chronic ulcer of unspecified part of right lower leg with fat layer exposed: Secondary | ICD-10-CM | POA: Diagnosis not present

## 2017-04-26 DIAGNOSIS — N183 Chronic kidney disease, stage 3 (moderate): Secondary | ICD-10-CM | POA: Diagnosis not present

## 2017-04-26 DIAGNOSIS — I130 Hypertensive heart and chronic kidney disease with heart failure and stage 1 through stage 4 chronic kidney disease, or unspecified chronic kidney disease: Secondary | ICD-10-CM | POA: Diagnosis not present

## 2017-04-29 DIAGNOSIS — I130 Hypertensive heart and chronic kidney disease with heart failure and stage 1 through stage 4 chronic kidney disease, or unspecified chronic kidney disease: Secondary | ICD-10-CM | POA: Diagnosis not present

## 2017-04-29 DIAGNOSIS — I482 Chronic atrial fibrillation: Secondary | ICD-10-CM | POA: Diagnosis not present

## 2017-04-29 DIAGNOSIS — I350 Nonrheumatic aortic (valve) stenosis: Secondary | ICD-10-CM | POA: Diagnosis not present

## 2017-04-29 DIAGNOSIS — L97912 Non-pressure chronic ulcer of unspecified part of right lower leg with fat layer exposed: Secondary | ICD-10-CM | POA: Diagnosis not present

## 2017-04-29 DIAGNOSIS — I5032 Chronic diastolic (congestive) heart failure: Secondary | ICD-10-CM | POA: Diagnosis not present

## 2017-04-29 DIAGNOSIS — N183 Chronic kidney disease, stage 3 (moderate): Secondary | ICD-10-CM | POA: Diagnosis not present

## 2017-05-01 DIAGNOSIS — L97912 Non-pressure chronic ulcer of unspecified part of right lower leg with fat layer exposed: Secondary | ICD-10-CM | POA: Diagnosis not present

## 2017-05-01 DIAGNOSIS — I5032 Chronic diastolic (congestive) heart failure: Secondary | ICD-10-CM | POA: Diagnosis not present

## 2017-05-01 DIAGNOSIS — N183 Chronic kidney disease, stage 3 (moderate): Secondary | ICD-10-CM | POA: Diagnosis not present

## 2017-05-01 DIAGNOSIS — I130 Hypertensive heart and chronic kidney disease with heart failure and stage 1 through stage 4 chronic kidney disease, or unspecified chronic kidney disease: Secondary | ICD-10-CM | POA: Diagnosis not present

## 2017-05-01 DIAGNOSIS — I350 Nonrheumatic aortic (valve) stenosis: Secondary | ICD-10-CM | POA: Diagnosis not present

## 2017-05-01 DIAGNOSIS — I482 Chronic atrial fibrillation: Secondary | ICD-10-CM | POA: Diagnosis not present

## 2017-05-03 DIAGNOSIS — I482 Chronic atrial fibrillation: Secondary | ICD-10-CM | POA: Diagnosis not present

## 2017-05-03 DIAGNOSIS — N183 Chronic kidney disease, stage 3 (moderate): Secondary | ICD-10-CM | POA: Diagnosis not present

## 2017-05-03 DIAGNOSIS — I350 Nonrheumatic aortic (valve) stenosis: Secondary | ICD-10-CM | POA: Diagnosis not present

## 2017-05-03 DIAGNOSIS — I5032 Chronic diastolic (congestive) heart failure: Secondary | ICD-10-CM | POA: Diagnosis not present

## 2017-05-03 DIAGNOSIS — L97912 Non-pressure chronic ulcer of unspecified part of right lower leg with fat layer exposed: Secondary | ICD-10-CM | POA: Diagnosis not present

## 2017-05-03 DIAGNOSIS — I130 Hypertensive heart and chronic kidney disease with heart failure and stage 1 through stage 4 chronic kidney disease, or unspecified chronic kidney disease: Secondary | ICD-10-CM | POA: Diagnosis not present

## 2017-05-06 DIAGNOSIS — I130 Hypertensive heart and chronic kidney disease with heart failure and stage 1 through stage 4 chronic kidney disease, or unspecified chronic kidney disease: Secondary | ICD-10-CM | POA: Diagnosis not present

## 2017-05-06 DIAGNOSIS — I350 Nonrheumatic aortic (valve) stenosis: Secondary | ICD-10-CM | POA: Diagnosis not present

## 2017-05-06 DIAGNOSIS — N183 Chronic kidney disease, stage 3 (moderate): Secondary | ICD-10-CM | POA: Diagnosis not present

## 2017-05-06 DIAGNOSIS — I482 Chronic atrial fibrillation: Secondary | ICD-10-CM | POA: Diagnosis not present

## 2017-05-06 DIAGNOSIS — L97912 Non-pressure chronic ulcer of unspecified part of right lower leg with fat layer exposed: Secondary | ICD-10-CM | POA: Diagnosis not present

## 2017-05-06 DIAGNOSIS — I5032 Chronic diastolic (congestive) heart failure: Secondary | ICD-10-CM | POA: Diagnosis not present

## 2017-05-07 DIAGNOSIS — N183 Chronic kidney disease, stage 3 (moderate): Secondary | ICD-10-CM | POA: Diagnosis not present

## 2017-05-07 DIAGNOSIS — L97912 Non-pressure chronic ulcer of unspecified part of right lower leg with fat layer exposed: Secondary | ICD-10-CM | POA: Diagnosis not present

## 2017-05-07 DIAGNOSIS — I482 Chronic atrial fibrillation: Secondary | ICD-10-CM | POA: Diagnosis not present

## 2017-05-07 DIAGNOSIS — I350 Nonrheumatic aortic (valve) stenosis: Secondary | ICD-10-CM | POA: Diagnosis not present

## 2017-05-07 DIAGNOSIS — I130 Hypertensive heart and chronic kidney disease with heart failure and stage 1 through stage 4 chronic kidney disease, or unspecified chronic kidney disease: Secondary | ICD-10-CM | POA: Diagnosis not present

## 2017-05-07 DIAGNOSIS — I5032 Chronic diastolic (congestive) heart failure: Secondary | ICD-10-CM | POA: Diagnosis not present

## 2017-05-08 DIAGNOSIS — L97912 Non-pressure chronic ulcer of unspecified part of right lower leg with fat layer exposed: Secondary | ICD-10-CM | POA: Diagnosis not present

## 2017-05-08 DIAGNOSIS — N183 Chronic kidney disease, stage 3 (moderate): Secondary | ICD-10-CM | POA: Diagnosis not present

## 2017-05-08 DIAGNOSIS — I482 Chronic atrial fibrillation: Secondary | ICD-10-CM | POA: Diagnosis not present

## 2017-05-08 DIAGNOSIS — I5032 Chronic diastolic (congestive) heart failure: Secondary | ICD-10-CM | POA: Diagnosis not present

## 2017-05-08 DIAGNOSIS — I350 Nonrheumatic aortic (valve) stenosis: Secondary | ICD-10-CM | POA: Diagnosis not present

## 2017-05-08 DIAGNOSIS — I130 Hypertensive heart and chronic kidney disease with heart failure and stage 1 through stage 4 chronic kidney disease, or unspecified chronic kidney disease: Secondary | ICD-10-CM | POA: Diagnosis not present

## 2017-05-10 DIAGNOSIS — I350 Nonrheumatic aortic (valve) stenosis: Secondary | ICD-10-CM | POA: Diagnosis not present

## 2017-05-10 DIAGNOSIS — L97912 Non-pressure chronic ulcer of unspecified part of right lower leg with fat layer exposed: Secondary | ICD-10-CM | POA: Diagnosis not present

## 2017-05-10 DIAGNOSIS — I5032 Chronic diastolic (congestive) heart failure: Secondary | ICD-10-CM | POA: Diagnosis not present

## 2017-05-10 DIAGNOSIS — I482 Chronic atrial fibrillation: Secondary | ICD-10-CM | POA: Diagnosis not present

## 2017-05-10 DIAGNOSIS — I130 Hypertensive heart and chronic kidney disease with heart failure and stage 1 through stage 4 chronic kidney disease, or unspecified chronic kidney disease: Secondary | ICD-10-CM | POA: Diagnosis not present

## 2017-05-10 DIAGNOSIS — N183 Chronic kidney disease, stage 3 (moderate): Secondary | ICD-10-CM | POA: Diagnosis not present

## 2017-05-12 DIAGNOSIS — I25119 Atherosclerotic heart disease of native coronary artery with unspecified angina pectoris: Secondary | ICD-10-CM | POA: Diagnosis not present

## 2017-05-12 DIAGNOSIS — N183 Chronic kidney disease, stage 3 (moderate): Secondary | ICD-10-CM | POA: Diagnosis not present

## 2017-05-12 DIAGNOSIS — K5790 Diverticulosis of intestine, part unspecified, without perforation or abscess without bleeding: Secondary | ICD-10-CM | POA: Diagnosis not present

## 2017-05-12 DIAGNOSIS — D649 Anemia, unspecified: Secondary | ICD-10-CM | POA: Diagnosis not present

## 2017-05-12 DIAGNOSIS — E669 Obesity, unspecified: Secondary | ICD-10-CM | POA: Diagnosis not present

## 2017-05-12 DIAGNOSIS — L97912 Non-pressure chronic ulcer of unspecified part of right lower leg with fat layer exposed: Secondary | ICD-10-CM | POA: Diagnosis not present

## 2017-05-12 DIAGNOSIS — Z87440 Personal history of urinary (tract) infections: Secondary | ICD-10-CM | POA: Diagnosis not present

## 2017-05-12 DIAGNOSIS — I482 Chronic atrial fibrillation: Secondary | ICD-10-CM | POA: Diagnosis not present

## 2017-05-12 DIAGNOSIS — I5032 Chronic diastolic (congestive) heart failure: Secondary | ICD-10-CM | POA: Diagnosis not present

## 2017-05-12 DIAGNOSIS — I350 Nonrheumatic aortic (valve) stenosis: Secondary | ICD-10-CM | POA: Diagnosis not present

## 2017-05-12 DIAGNOSIS — I130 Hypertensive heart and chronic kidney disease with heart failure and stage 1 through stage 4 chronic kidney disease, or unspecified chronic kidney disease: Secondary | ICD-10-CM | POA: Diagnosis not present

## 2017-05-13 DIAGNOSIS — I350 Nonrheumatic aortic (valve) stenosis: Secondary | ICD-10-CM | POA: Diagnosis not present

## 2017-05-13 DIAGNOSIS — L97912 Non-pressure chronic ulcer of unspecified part of right lower leg with fat layer exposed: Secondary | ICD-10-CM | POA: Diagnosis not present

## 2017-05-13 DIAGNOSIS — I5032 Chronic diastolic (congestive) heart failure: Secondary | ICD-10-CM | POA: Diagnosis not present

## 2017-05-13 DIAGNOSIS — N183 Chronic kidney disease, stage 3 (moderate): Secondary | ICD-10-CM | POA: Diagnosis not present

## 2017-05-13 DIAGNOSIS — I482 Chronic atrial fibrillation: Secondary | ICD-10-CM | POA: Diagnosis not present

## 2017-05-13 DIAGNOSIS — I130 Hypertensive heart and chronic kidney disease with heart failure and stage 1 through stage 4 chronic kidney disease, or unspecified chronic kidney disease: Secondary | ICD-10-CM | POA: Diagnosis not present

## 2017-05-15 DIAGNOSIS — I130 Hypertensive heart and chronic kidney disease with heart failure and stage 1 through stage 4 chronic kidney disease, or unspecified chronic kidney disease: Secondary | ICD-10-CM | POA: Diagnosis not present

## 2017-05-15 DIAGNOSIS — I482 Chronic atrial fibrillation: Secondary | ICD-10-CM | POA: Diagnosis not present

## 2017-05-15 DIAGNOSIS — I5032 Chronic diastolic (congestive) heart failure: Secondary | ICD-10-CM | POA: Diagnosis not present

## 2017-05-15 DIAGNOSIS — I350 Nonrheumatic aortic (valve) stenosis: Secondary | ICD-10-CM | POA: Diagnosis not present

## 2017-05-15 DIAGNOSIS — N183 Chronic kidney disease, stage 3 (moderate): Secondary | ICD-10-CM | POA: Diagnosis not present

## 2017-05-15 DIAGNOSIS — L97912 Non-pressure chronic ulcer of unspecified part of right lower leg with fat layer exposed: Secondary | ICD-10-CM | POA: Diagnosis not present

## 2017-05-17 DIAGNOSIS — I130 Hypertensive heart and chronic kidney disease with heart failure and stage 1 through stage 4 chronic kidney disease, or unspecified chronic kidney disease: Secondary | ICD-10-CM | POA: Diagnosis not present

## 2017-05-17 DIAGNOSIS — I350 Nonrheumatic aortic (valve) stenosis: Secondary | ICD-10-CM | POA: Diagnosis not present

## 2017-05-17 DIAGNOSIS — I5032 Chronic diastolic (congestive) heart failure: Secondary | ICD-10-CM | POA: Diagnosis not present

## 2017-05-17 DIAGNOSIS — N183 Chronic kidney disease, stage 3 (moderate): Secondary | ICD-10-CM | POA: Diagnosis not present

## 2017-05-17 DIAGNOSIS — L97912 Non-pressure chronic ulcer of unspecified part of right lower leg with fat layer exposed: Secondary | ICD-10-CM | POA: Diagnosis not present

## 2017-05-17 DIAGNOSIS — I482 Chronic atrial fibrillation: Secondary | ICD-10-CM | POA: Diagnosis not present

## 2017-05-20 DIAGNOSIS — I350 Nonrheumatic aortic (valve) stenosis: Secondary | ICD-10-CM | POA: Diagnosis not present

## 2017-05-20 DIAGNOSIS — I130 Hypertensive heart and chronic kidney disease with heart failure and stage 1 through stage 4 chronic kidney disease, or unspecified chronic kidney disease: Secondary | ICD-10-CM | POA: Diagnosis not present

## 2017-05-20 DIAGNOSIS — L97912 Non-pressure chronic ulcer of unspecified part of right lower leg with fat layer exposed: Secondary | ICD-10-CM | POA: Diagnosis not present

## 2017-05-20 DIAGNOSIS — N183 Chronic kidney disease, stage 3 (moderate): Secondary | ICD-10-CM | POA: Diagnosis not present

## 2017-05-20 DIAGNOSIS — I482 Chronic atrial fibrillation: Secondary | ICD-10-CM | POA: Diagnosis not present

## 2017-05-20 DIAGNOSIS — I5032 Chronic diastolic (congestive) heart failure: Secondary | ICD-10-CM | POA: Diagnosis not present

## 2017-05-22 DIAGNOSIS — I482 Chronic atrial fibrillation: Secondary | ICD-10-CM | POA: Diagnosis not present

## 2017-05-22 DIAGNOSIS — I130 Hypertensive heart and chronic kidney disease with heart failure and stage 1 through stage 4 chronic kidney disease, or unspecified chronic kidney disease: Secondary | ICD-10-CM | POA: Diagnosis not present

## 2017-05-22 DIAGNOSIS — I350 Nonrheumatic aortic (valve) stenosis: Secondary | ICD-10-CM | POA: Diagnosis not present

## 2017-05-22 DIAGNOSIS — N183 Chronic kidney disease, stage 3 (moderate): Secondary | ICD-10-CM | POA: Diagnosis not present

## 2017-05-22 DIAGNOSIS — L97912 Non-pressure chronic ulcer of unspecified part of right lower leg with fat layer exposed: Secondary | ICD-10-CM | POA: Diagnosis not present

## 2017-05-22 DIAGNOSIS — I5032 Chronic diastolic (congestive) heart failure: Secondary | ICD-10-CM | POA: Diagnosis not present

## 2017-05-24 DIAGNOSIS — I130 Hypertensive heart and chronic kidney disease with heart failure and stage 1 through stage 4 chronic kidney disease, or unspecified chronic kidney disease: Secondary | ICD-10-CM | POA: Diagnosis not present

## 2017-05-24 DIAGNOSIS — I482 Chronic atrial fibrillation: Secondary | ICD-10-CM | POA: Diagnosis not present

## 2017-05-24 DIAGNOSIS — L97912 Non-pressure chronic ulcer of unspecified part of right lower leg with fat layer exposed: Secondary | ICD-10-CM | POA: Diagnosis not present

## 2017-05-24 DIAGNOSIS — I350 Nonrheumatic aortic (valve) stenosis: Secondary | ICD-10-CM | POA: Diagnosis not present

## 2017-05-24 DIAGNOSIS — N183 Chronic kidney disease, stage 3 (moderate): Secondary | ICD-10-CM | POA: Diagnosis not present

## 2017-05-24 DIAGNOSIS — I5032 Chronic diastolic (congestive) heart failure: Secondary | ICD-10-CM | POA: Diagnosis not present

## 2017-05-26 DIAGNOSIS — I130 Hypertensive heart and chronic kidney disease with heart failure and stage 1 through stage 4 chronic kidney disease, or unspecified chronic kidney disease: Secondary | ICD-10-CM | POA: Diagnosis not present

## 2017-05-26 DIAGNOSIS — L97912 Non-pressure chronic ulcer of unspecified part of right lower leg with fat layer exposed: Secondary | ICD-10-CM | POA: Diagnosis not present

## 2017-05-26 DIAGNOSIS — I350 Nonrheumatic aortic (valve) stenosis: Secondary | ICD-10-CM | POA: Diagnosis not present

## 2017-05-26 DIAGNOSIS — I5032 Chronic diastolic (congestive) heart failure: Secondary | ICD-10-CM | POA: Diagnosis not present

## 2017-05-26 DIAGNOSIS — N183 Chronic kidney disease, stage 3 (moderate): Secondary | ICD-10-CM | POA: Diagnosis not present

## 2017-05-26 DIAGNOSIS — I482 Chronic atrial fibrillation: Secondary | ICD-10-CM | POA: Diagnosis not present

## 2017-05-27 DIAGNOSIS — N183 Chronic kidney disease, stage 3 (moderate): Secondary | ICD-10-CM | POA: Diagnosis not present

## 2017-05-27 DIAGNOSIS — L97912 Non-pressure chronic ulcer of unspecified part of right lower leg with fat layer exposed: Secondary | ICD-10-CM | POA: Diagnosis not present

## 2017-05-27 DIAGNOSIS — I350 Nonrheumatic aortic (valve) stenosis: Secondary | ICD-10-CM | POA: Diagnosis not present

## 2017-05-27 DIAGNOSIS — I5032 Chronic diastolic (congestive) heart failure: Secondary | ICD-10-CM | POA: Diagnosis not present

## 2017-05-27 DIAGNOSIS — I482 Chronic atrial fibrillation: Secondary | ICD-10-CM | POA: Diagnosis not present

## 2017-05-27 DIAGNOSIS — I130 Hypertensive heart and chronic kidney disease with heart failure and stage 1 through stage 4 chronic kidney disease, or unspecified chronic kidney disease: Secondary | ICD-10-CM | POA: Diagnosis not present

## 2017-06-03 DIAGNOSIS — D696 Thrombocytopenia, unspecified: Secondary | ICD-10-CM | POA: Diagnosis not present

## 2017-06-03 DIAGNOSIS — E039 Hypothyroidism, unspecified: Secondary | ICD-10-CM | POA: Diagnosis not present

## 2017-06-03 DIAGNOSIS — M1710 Unilateral primary osteoarthritis, unspecified knee: Secondary | ICD-10-CM | POA: Diagnosis not present

## 2017-06-03 DIAGNOSIS — I5030 Unspecified diastolic (congestive) heart failure: Secondary | ICD-10-CM | POA: Diagnosis not present

## 2017-06-03 DIAGNOSIS — E559 Vitamin D deficiency, unspecified: Secondary | ICD-10-CM | POA: Diagnosis not present

## 2017-06-03 DIAGNOSIS — I10 Essential (primary) hypertension: Secondary | ICD-10-CM | POA: Diagnosis not present

## 2017-06-03 DIAGNOSIS — H9193 Unspecified hearing loss, bilateral: Secondary | ICD-10-CM | POA: Diagnosis not present

## 2017-06-03 DIAGNOSIS — M1611 Unilateral primary osteoarthritis, right hip: Secondary | ICD-10-CM | POA: Diagnosis not present

## 2017-06-13 DIAGNOSIS — H903 Sensorineural hearing loss, bilateral: Secondary | ICD-10-CM | POA: Diagnosis not present

## 2017-06-13 DIAGNOSIS — H9113 Presbycusis, bilateral: Secondary | ICD-10-CM | POA: Diagnosis not present

## 2017-06-27 ENCOUNTER — Encounter: Payer: Self-pay | Admitting: Podiatry

## 2017-06-27 ENCOUNTER — Ambulatory Visit (INDEPENDENT_AMBULATORY_CARE_PROVIDER_SITE_OTHER): Payer: Medicare Other | Admitting: Podiatry

## 2017-06-27 DIAGNOSIS — L600 Ingrowing nail: Secondary | ICD-10-CM

## 2017-06-27 DIAGNOSIS — M79672 Pain in left foot: Secondary | ICD-10-CM | POA: Diagnosis not present

## 2017-06-27 DIAGNOSIS — B351 Tinea unguium: Secondary | ICD-10-CM | POA: Diagnosis not present

## 2017-06-27 DIAGNOSIS — M79671 Pain in right foot: Secondary | ICD-10-CM | POA: Diagnosis not present

## 2017-06-27 NOTE — Patient Instructions (Signed)
Seen for hypertrophic nails. Noted of ingrown nail on right great toe. All nails debrided. Return in 3 months or as needed.

## 2017-06-27 NOTE — Progress Notes (Signed)
SUBJECTIVE: 83y.o. year old female presents via wheel chair complaining of painful toe. Patient was accompanied by her son. Both big toes are painful.Having hard of hearing.   OBJECTIVE: DERMATOLOGIC EXAMINATION: Painful ingrown nails both great toes without open infection or drainage. Hypertrophic nails x 10. VASCULAR EXAMINATION OF LOWER LIMBS: Pedal pulses: All pedal pulses are palpable with normal pulsation. Minimum edema noted. Temperature gradient from tibial crest to dorsum of foot is within normal bilateral. NEUROLOGIC EXAMINATION OF THE LOWER LIMBS: All epicritic and tactile sensations grossly intact. MUSCULOSKELETAL EXAMINATION: Mild digital contracture lesser digits bilateral.  ASSESSMENT: Onychomycosis x 10. Ingrown nail both great toes. Pain in lower limb.  Plan: All nails debrided. Pain was relieved. Return in 3 months

## 2017-07-03 DIAGNOSIS — I130 Hypertensive heart and chronic kidney disease with heart failure and stage 1 through stage 4 chronic kidney disease, or unspecified chronic kidney disease: Secondary | ICD-10-CM | POA: Diagnosis not present

## 2017-07-09 DIAGNOSIS — I482 Chronic atrial fibrillation: Secondary | ICD-10-CM | POA: Diagnosis not present

## 2017-07-09 DIAGNOSIS — D649 Anemia, unspecified: Secondary | ICD-10-CM | POA: Diagnosis not present

## 2017-07-09 DIAGNOSIS — Z87440 Personal history of urinary (tract) infections: Secondary | ICD-10-CM | POA: Diagnosis not present

## 2017-07-09 DIAGNOSIS — I5032 Chronic diastolic (congestive) heart failure: Secondary | ICD-10-CM | POA: Diagnosis not present

## 2017-07-09 DIAGNOSIS — I25119 Atherosclerotic heart disease of native coronary artery with unspecified angina pectoris: Secondary | ICD-10-CM | POA: Diagnosis not present

## 2017-07-09 DIAGNOSIS — E669 Obesity, unspecified: Secondary | ICD-10-CM | POA: Diagnosis not present

## 2017-07-09 DIAGNOSIS — K5790 Diverticulosis of intestine, part unspecified, without perforation or abscess without bleeding: Secondary | ICD-10-CM | POA: Diagnosis not present

## 2017-07-09 DIAGNOSIS — N183 Chronic kidney disease, stage 3 (moderate): Secondary | ICD-10-CM | POA: Diagnosis not present

## 2017-07-09 DIAGNOSIS — I130 Hypertensive heart and chronic kidney disease with heart failure and stage 1 through stage 4 chronic kidney disease, or unspecified chronic kidney disease: Secondary | ICD-10-CM | POA: Diagnosis not present

## 2017-07-09 DIAGNOSIS — I350 Nonrheumatic aortic (valve) stenosis: Secondary | ICD-10-CM | POA: Diagnosis not present

## 2017-07-10 DIAGNOSIS — N183 Chronic kidney disease, stage 3 (moderate): Secondary | ICD-10-CM | POA: Diagnosis not present

## 2017-07-10 DIAGNOSIS — I350 Nonrheumatic aortic (valve) stenosis: Secondary | ICD-10-CM | POA: Diagnosis not present

## 2017-07-10 DIAGNOSIS — I130 Hypertensive heart and chronic kidney disease with heart failure and stage 1 through stage 4 chronic kidney disease, or unspecified chronic kidney disease: Secondary | ICD-10-CM | POA: Diagnosis not present

## 2017-07-10 DIAGNOSIS — I25119 Atherosclerotic heart disease of native coronary artery with unspecified angina pectoris: Secondary | ICD-10-CM | POA: Diagnosis not present

## 2017-07-10 DIAGNOSIS — I482 Chronic atrial fibrillation: Secondary | ICD-10-CM | POA: Diagnosis not present

## 2017-07-10 DIAGNOSIS — I5032 Chronic diastolic (congestive) heart failure: Secondary | ICD-10-CM | POA: Diagnosis not present

## 2017-07-11 DIAGNOSIS — I5032 Chronic diastolic (congestive) heart failure: Secondary | ICD-10-CM | POA: Diagnosis not present

## 2017-07-11 DIAGNOSIS — I25119 Atherosclerotic heart disease of native coronary artery with unspecified angina pectoris: Secondary | ICD-10-CM | POA: Diagnosis not present

## 2017-07-11 DIAGNOSIS — I482 Chronic atrial fibrillation: Secondary | ICD-10-CM | POA: Diagnosis not present

## 2017-07-11 DIAGNOSIS — N183 Chronic kidney disease, stage 3 (moderate): Secondary | ICD-10-CM | POA: Diagnosis not present

## 2017-07-11 DIAGNOSIS — I130 Hypertensive heart and chronic kidney disease with heart failure and stage 1 through stage 4 chronic kidney disease, or unspecified chronic kidney disease: Secondary | ICD-10-CM | POA: Diagnosis not present

## 2017-07-11 DIAGNOSIS — I350 Nonrheumatic aortic (valve) stenosis: Secondary | ICD-10-CM | POA: Diagnosis not present

## 2017-07-13 DIAGNOSIS — D649 Anemia, unspecified: Secondary | ICD-10-CM | POA: Diagnosis not present

## 2017-07-13 DIAGNOSIS — I25119 Atherosclerotic heart disease of native coronary artery with unspecified angina pectoris: Secondary | ICD-10-CM | POA: Diagnosis not present

## 2017-07-13 DIAGNOSIS — K5790 Diverticulosis of intestine, part unspecified, without perforation or abscess without bleeding: Secondary | ICD-10-CM | POA: Diagnosis not present

## 2017-07-13 DIAGNOSIS — I482 Chronic atrial fibrillation: Secondary | ICD-10-CM | POA: Diagnosis not present

## 2017-07-13 DIAGNOSIS — E669 Obesity, unspecified: Secondary | ICD-10-CM | POA: Diagnosis not present

## 2017-07-13 DIAGNOSIS — I130 Hypertensive heart and chronic kidney disease with heart failure and stage 1 through stage 4 chronic kidney disease, or unspecified chronic kidney disease: Secondary | ICD-10-CM | POA: Diagnosis not present

## 2017-07-13 DIAGNOSIS — N183 Chronic kidney disease, stage 3 (moderate): Secondary | ICD-10-CM | POA: Diagnosis not present

## 2017-07-13 DIAGNOSIS — I350 Nonrheumatic aortic (valve) stenosis: Secondary | ICD-10-CM | POA: Diagnosis not present

## 2017-07-13 DIAGNOSIS — I5032 Chronic diastolic (congestive) heart failure: Secondary | ICD-10-CM | POA: Diagnosis not present

## 2017-07-13 DIAGNOSIS — Z87440 Personal history of urinary (tract) infections: Secondary | ICD-10-CM | POA: Diagnosis not present

## 2017-07-14 DIAGNOSIS — N183 Chronic kidney disease, stage 3 (moderate): Secondary | ICD-10-CM | POA: Diagnosis not present

## 2017-07-14 DIAGNOSIS — I350 Nonrheumatic aortic (valve) stenosis: Secondary | ICD-10-CM | POA: Diagnosis not present

## 2017-07-14 DIAGNOSIS — I482 Chronic atrial fibrillation: Secondary | ICD-10-CM | POA: Diagnosis not present

## 2017-07-14 DIAGNOSIS — I5032 Chronic diastolic (congestive) heart failure: Secondary | ICD-10-CM | POA: Diagnosis not present

## 2017-07-14 DIAGNOSIS — I25119 Atherosclerotic heart disease of native coronary artery with unspecified angina pectoris: Secondary | ICD-10-CM | POA: Diagnosis not present

## 2017-07-14 DIAGNOSIS — I130 Hypertensive heart and chronic kidney disease with heart failure and stage 1 through stage 4 chronic kidney disease, or unspecified chronic kidney disease: Secondary | ICD-10-CM | POA: Diagnosis not present

## 2017-07-15 DIAGNOSIS — N183 Chronic kidney disease, stage 3 (moderate): Secondary | ICD-10-CM | POA: Diagnosis not present

## 2017-07-15 DIAGNOSIS — I25119 Atherosclerotic heart disease of native coronary artery with unspecified angina pectoris: Secondary | ICD-10-CM | POA: Diagnosis not present

## 2017-07-15 DIAGNOSIS — I350 Nonrheumatic aortic (valve) stenosis: Secondary | ICD-10-CM | POA: Diagnosis not present

## 2017-07-15 DIAGNOSIS — I130 Hypertensive heart and chronic kidney disease with heart failure and stage 1 through stage 4 chronic kidney disease, or unspecified chronic kidney disease: Secondary | ICD-10-CM | POA: Diagnosis not present

## 2017-07-15 DIAGNOSIS — I482 Chronic atrial fibrillation: Secondary | ICD-10-CM | POA: Diagnosis not present

## 2017-07-15 DIAGNOSIS — I5032 Chronic diastolic (congestive) heart failure: Secondary | ICD-10-CM | POA: Diagnosis not present

## 2017-07-16 DIAGNOSIS — I25119 Atherosclerotic heart disease of native coronary artery with unspecified angina pectoris: Secondary | ICD-10-CM | POA: Diagnosis not present

## 2017-07-16 DIAGNOSIS — I130 Hypertensive heart and chronic kidney disease with heart failure and stage 1 through stage 4 chronic kidney disease, or unspecified chronic kidney disease: Secondary | ICD-10-CM | POA: Diagnosis not present

## 2017-07-16 DIAGNOSIS — I350 Nonrheumatic aortic (valve) stenosis: Secondary | ICD-10-CM | POA: Diagnosis not present

## 2017-07-16 DIAGNOSIS — I5032 Chronic diastolic (congestive) heart failure: Secondary | ICD-10-CM | POA: Diagnosis not present

## 2017-07-16 DIAGNOSIS — I482 Chronic atrial fibrillation: Secondary | ICD-10-CM | POA: Diagnosis not present

## 2017-07-16 DIAGNOSIS — N183 Chronic kidney disease, stage 3 (moderate): Secondary | ICD-10-CM | POA: Diagnosis not present

## 2017-07-21 DIAGNOSIS — I130 Hypertensive heart and chronic kidney disease with heart failure and stage 1 through stage 4 chronic kidney disease, or unspecified chronic kidney disease: Secondary | ICD-10-CM | POA: Diagnosis not present

## 2017-07-21 DIAGNOSIS — I350 Nonrheumatic aortic (valve) stenosis: Secondary | ICD-10-CM | POA: Diagnosis not present

## 2017-07-21 DIAGNOSIS — N183 Chronic kidney disease, stage 3 (moderate): Secondary | ICD-10-CM | POA: Diagnosis not present

## 2017-07-21 DIAGNOSIS — I5032 Chronic diastolic (congestive) heart failure: Secondary | ICD-10-CM | POA: Diagnosis not present

## 2017-07-21 DIAGNOSIS — I482 Chronic atrial fibrillation: Secondary | ICD-10-CM | POA: Diagnosis not present

## 2017-07-21 DIAGNOSIS — I25119 Atherosclerotic heart disease of native coronary artery with unspecified angina pectoris: Secondary | ICD-10-CM | POA: Diagnosis not present

## 2017-07-22 DIAGNOSIS — I350 Nonrheumatic aortic (valve) stenosis: Secondary | ICD-10-CM | POA: Diagnosis not present

## 2017-07-22 DIAGNOSIS — I5032 Chronic diastolic (congestive) heart failure: Secondary | ICD-10-CM | POA: Diagnosis not present

## 2017-07-22 DIAGNOSIS — I482 Chronic atrial fibrillation: Secondary | ICD-10-CM | POA: Diagnosis not present

## 2017-07-22 DIAGNOSIS — I130 Hypertensive heart and chronic kidney disease with heart failure and stage 1 through stage 4 chronic kidney disease, or unspecified chronic kidney disease: Secondary | ICD-10-CM | POA: Diagnosis not present

## 2017-07-22 DIAGNOSIS — I25119 Atherosclerotic heart disease of native coronary artery with unspecified angina pectoris: Secondary | ICD-10-CM | POA: Diagnosis not present

## 2017-07-22 DIAGNOSIS — N183 Chronic kidney disease, stage 3 (moderate): Secondary | ICD-10-CM | POA: Diagnosis not present

## 2017-07-29 DIAGNOSIS — I5032 Chronic diastolic (congestive) heart failure: Secondary | ICD-10-CM | POA: Diagnosis not present

## 2017-07-29 DIAGNOSIS — I25119 Atherosclerotic heart disease of native coronary artery with unspecified angina pectoris: Secondary | ICD-10-CM | POA: Diagnosis not present

## 2017-07-29 DIAGNOSIS — I350 Nonrheumatic aortic (valve) stenosis: Secondary | ICD-10-CM | POA: Diagnosis not present

## 2017-07-29 DIAGNOSIS — N183 Chronic kidney disease, stage 3 (moderate): Secondary | ICD-10-CM | POA: Diagnosis not present

## 2017-07-29 DIAGNOSIS — I130 Hypertensive heart and chronic kidney disease with heart failure and stage 1 through stage 4 chronic kidney disease, or unspecified chronic kidney disease: Secondary | ICD-10-CM | POA: Diagnosis not present

## 2017-07-29 DIAGNOSIS — I482 Chronic atrial fibrillation: Secondary | ICD-10-CM | POA: Diagnosis not present

## 2017-08-02 DIAGNOSIS — L570 Actinic keratosis: Secondary | ICD-10-CM | POA: Diagnosis not present

## 2017-08-02 DIAGNOSIS — X32XXXD Exposure to sunlight, subsequent encounter: Secondary | ICD-10-CM | POA: Diagnosis not present

## 2017-08-02 DIAGNOSIS — L821 Other seborrheic keratosis: Secondary | ICD-10-CM | POA: Diagnosis not present

## 2017-08-05 DIAGNOSIS — I350 Nonrheumatic aortic (valve) stenosis: Secondary | ICD-10-CM | POA: Diagnosis not present

## 2017-08-05 DIAGNOSIS — I5032 Chronic diastolic (congestive) heart failure: Secondary | ICD-10-CM | POA: Diagnosis not present

## 2017-08-05 DIAGNOSIS — N183 Chronic kidney disease, stage 3 (moderate): Secondary | ICD-10-CM | POA: Diagnosis not present

## 2017-08-05 DIAGNOSIS — I130 Hypertensive heart and chronic kidney disease with heart failure and stage 1 through stage 4 chronic kidney disease, or unspecified chronic kidney disease: Secondary | ICD-10-CM | POA: Diagnosis not present

## 2017-08-05 DIAGNOSIS — I25119 Atherosclerotic heart disease of native coronary artery with unspecified angina pectoris: Secondary | ICD-10-CM | POA: Diagnosis not present

## 2017-08-05 DIAGNOSIS — I482 Chronic atrial fibrillation: Secondary | ICD-10-CM | POA: Diagnosis not present

## 2017-08-07 DIAGNOSIS — Z974 Presence of external hearing-aid: Secondary | ICD-10-CM | POA: Diagnosis not present

## 2017-08-07 DIAGNOSIS — H903 Sensorineural hearing loss, bilateral: Secondary | ICD-10-CM | POA: Diagnosis not present

## 2017-08-07 DIAGNOSIS — H6122 Impacted cerumen, left ear: Secondary | ICD-10-CM | POA: Diagnosis not present

## 2017-08-09 DIAGNOSIS — I482 Chronic atrial fibrillation: Secondary | ICD-10-CM | POA: Diagnosis not present

## 2017-08-09 DIAGNOSIS — I130 Hypertensive heart and chronic kidney disease with heart failure and stage 1 through stage 4 chronic kidney disease, or unspecified chronic kidney disease: Secondary | ICD-10-CM | POA: Diagnosis not present

## 2017-08-09 DIAGNOSIS — I350 Nonrheumatic aortic (valve) stenosis: Secondary | ICD-10-CM | POA: Diagnosis not present

## 2017-08-09 DIAGNOSIS — N183 Chronic kidney disease, stage 3 (moderate): Secondary | ICD-10-CM | POA: Diagnosis not present

## 2017-08-09 DIAGNOSIS — I5032 Chronic diastolic (congestive) heart failure: Secondary | ICD-10-CM | POA: Diagnosis not present

## 2017-08-09 DIAGNOSIS — I25119 Atherosclerotic heart disease of native coronary artery with unspecified angina pectoris: Secondary | ICD-10-CM | POA: Diagnosis not present

## 2017-08-11 DIAGNOSIS — I482 Chronic atrial fibrillation: Secondary | ICD-10-CM | POA: Diagnosis not present

## 2017-08-11 DIAGNOSIS — I25119 Atherosclerotic heart disease of native coronary artery with unspecified angina pectoris: Secondary | ICD-10-CM | POA: Diagnosis not present

## 2017-08-11 DIAGNOSIS — I5032 Chronic diastolic (congestive) heart failure: Secondary | ICD-10-CM | POA: Diagnosis not present

## 2017-08-11 DIAGNOSIS — I350 Nonrheumatic aortic (valve) stenosis: Secondary | ICD-10-CM | POA: Diagnosis not present

## 2017-08-11 DIAGNOSIS — N183 Chronic kidney disease, stage 3 (moderate): Secondary | ICD-10-CM | POA: Diagnosis not present

## 2017-08-11 DIAGNOSIS — I130 Hypertensive heart and chronic kidney disease with heart failure and stage 1 through stage 4 chronic kidney disease, or unspecified chronic kidney disease: Secondary | ICD-10-CM | POA: Diagnosis not present

## 2017-08-12 DIAGNOSIS — D649 Anemia, unspecified: Secondary | ICD-10-CM | POA: Diagnosis not present

## 2017-08-12 DIAGNOSIS — E669 Obesity, unspecified: Secondary | ICD-10-CM | POA: Diagnosis not present

## 2017-08-12 DIAGNOSIS — I5032 Chronic diastolic (congestive) heart failure: Secondary | ICD-10-CM | POA: Diagnosis not present

## 2017-08-12 DIAGNOSIS — N183 Chronic kidney disease, stage 3 (moderate): Secondary | ICD-10-CM | POA: Diagnosis not present

## 2017-08-12 DIAGNOSIS — I482 Chronic atrial fibrillation: Secondary | ICD-10-CM | POA: Diagnosis not present

## 2017-08-12 DIAGNOSIS — K5790 Diverticulosis of intestine, part unspecified, without perforation or abscess without bleeding: Secondary | ICD-10-CM | POA: Diagnosis not present

## 2017-08-12 DIAGNOSIS — I25119 Atherosclerotic heart disease of native coronary artery with unspecified angina pectoris: Secondary | ICD-10-CM | POA: Diagnosis not present

## 2017-08-12 DIAGNOSIS — Z87440 Personal history of urinary (tract) infections: Secondary | ICD-10-CM | POA: Diagnosis not present

## 2017-08-12 DIAGNOSIS — I130 Hypertensive heart and chronic kidney disease with heart failure and stage 1 through stage 4 chronic kidney disease, or unspecified chronic kidney disease: Secondary | ICD-10-CM | POA: Diagnosis not present

## 2017-08-12 DIAGNOSIS — I350 Nonrheumatic aortic (valve) stenosis: Secondary | ICD-10-CM | POA: Diagnosis not present

## 2017-08-14 DIAGNOSIS — I350 Nonrheumatic aortic (valve) stenosis: Secondary | ICD-10-CM | POA: Diagnosis not present

## 2017-08-14 DIAGNOSIS — I482 Chronic atrial fibrillation: Secondary | ICD-10-CM | POA: Diagnosis not present

## 2017-08-14 DIAGNOSIS — I5032 Chronic diastolic (congestive) heart failure: Secondary | ICD-10-CM | POA: Diagnosis not present

## 2017-08-14 DIAGNOSIS — I25119 Atherosclerotic heart disease of native coronary artery with unspecified angina pectoris: Secondary | ICD-10-CM | POA: Diagnosis not present

## 2017-08-14 DIAGNOSIS — N183 Chronic kidney disease, stage 3 (moderate): Secondary | ICD-10-CM | POA: Diagnosis not present

## 2017-08-14 DIAGNOSIS — I130 Hypertensive heart and chronic kidney disease with heart failure and stage 1 through stage 4 chronic kidney disease, or unspecified chronic kidney disease: Secondary | ICD-10-CM | POA: Diagnosis not present

## 2017-08-15 DIAGNOSIS — I25119 Atherosclerotic heart disease of native coronary artery with unspecified angina pectoris: Secondary | ICD-10-CM | POA: Diagnosis not present

## 2017-08-15 DIAGNOSIS — I482 Chronic atrial fibrillation: Secondary | ICD-10-CM | POA: Diagnosis not present

## 2017-08-15 DIAGNOSIS — I130 Hypertensive heart and chronic kidney disease with heart failure and stage 1 through stage 4 chronic kidney disease, or unspecified chronic kidney disease: Secondary | ICD-10-CM | POA: Diagnosis not present

## 2017-08-15 DIAGNOSIS — I5032 Chronic diastolic (congestive) heart failure: Secondary | ICD-10-CM | POA: Diagnosis not present

## 2017-08-15 DIAGNOSIS — I350 Nonrheumatic aortic (valve) stenosis: Secondary | ICD-10-CM | POA: Diagnosis not present

## 2017-08-15 DIAGNOSIS — N183 Chronic kidney disease, stage 3 (moderate): Secondary | ICD-10-CM | POA: Diagnosis not present

## 2017-08-16 DIAGNOSIS — M1991 Primary osteoarthritis, unspecified site: Secondary | ICD-10-CM | POA: Diagnosis not present

## 2017-08-16 DIAGNOSIS — F339 Major depressive disorder, recurrent, unspecified: Secondary | ICD-10-CM | POA: Diagnosis not present

## 2017-08-16 DIAGNOSIS — N183 Chronic kidney disease, stage 3 (moderate): Secondary | ICD-10-CM | POA: Diagnosis not present

## 2017-08-16 DIAGNOSIS — I482 Chronic atrial fibrillation: Secondary | ICD-10-CM | POA: Diagnosis not present

## 2017-08-16 DIAGNOSIS — I25119 Atherosclerotic heart disease of native coronary artery with unspecified angina pectoris: Secondary | ICD-10-CM | POA: Diagnosis not present

## 2017-08-16 DIAGNOSIS — I5032 Chronic diastolic (congestive) heart failure: Secondary | ICD-10-CM | POA: Diagnosis not present

## 2017-08-16 DIAGNOSIS — I2510 Atherosclerotic heart disease of native coronary artery without angina pectoris: Secondary | ICD-10-CM | POA: Diagnosis not present

## 2017-08-16 DIAGNOSIS — I509 Heart failure, unspecified: Secondary | ICD-10-CM | POA: Diagnosis not present

## 2017-08-16 DIAGNOSIS — I350 Nonrheumatic aortic (valve) stenosis: Secondary | ICD-10-CM | POA: Diagnosis not present

## 2017-08-16 DIAGNOSIS — I130 Hypertensive heart and chronic kidney disease with heart failure and stage 1 through stage 4 chronic kidney disease, or unspecified chronic kidney disease: Secondary | ICD-10-CM | POA: Diagnosis not present

## 2017-08-19 DIAGNOSIS — I350 Nonrheumatic aortic (valve) stenosis: Secondary | ICD-10-CM | POA: Diagnosis not present

## 2017-08-19 DIAGNOSIS — I482 Chronic atrial fibrillation: Secondary | ICD-10-CM | POA: Diagnosis not present

## 2017-08-19 DIAGNOSIS — I5032 Chronic diastolic (congestive) heart failure: Secondary | ICD-10-CM | POA: Diagnosis not present

## 2017-08-19 DIAGNOSIS — I25119 Atherosclerotic heart disease of native coronary artery with unspecified angina pectoris: Secondary | ICD-10-CM | POA: Diagnosis not present

## 2017-08-19 DIAGNOSIS — I130 Hypertensive heart and chronic kidney disease with heart failure and stage 1 through stage 4 chronic kidney disease, or unspecified chronic kidney disease: Secondary | ICD-10-CM | POA: Diagnosis not present

## 2017-08-19 DIAGNOSIS — N183 Chronic kidney disease, stage 3 (moderate): Secondary | ICD-10-CM | POA: Diagnosis not present

## 2017-08-20 DIAGNOSIS — I130 Hypertensive heart and chronic kidney disease with heart failure and stage 1 through stage 4 chronic kidney disease, or unspecified chronic kidney disease: Secondary | ICD-10-CM | POA: Diagnosis not present

## 2017-08-20 DIAGNOSIS — N183 Chronic kidney disease, stage 3 (moderate): Secondary | ICD-10-CM | POA: Diagnosis not present

## 2017-08-20 DIAGNOSIS — I25119 Atherosclerotic heart disease of native coronary artery with unspecified angina pectoris: Secondary | ICD-10-CM | POA: Diagnosis not present

## 2017-08-20 DIAGNOSIS — I350 Nonrheumatic aortic (valve) stenosis: Secondary | ICD-10-CM | POA: Diagnosis not present

## 2017-08-20 DIAGNOSIS — I5032 Chronic diastolic (congestive) heart failure: Secondary | ICD-10-CM | POA: Diagnosis not present

## 2017-08-20 DIAGNOSIS — I482 Chronic atrial fibrillation: Secondary | ICD-10-CM | POA: Diagnosis not present

## 2017-08-21 DIAGNOSIS — I25119 Atherosclerotic heart disease of native coronary artery with unspecified angina pectoris: Secondary | ICD-10-CM | POA: Diagnosis not present

## 2017-08-21 DIAGNOSIS — I130 Hypertensive heart and chronic kidney disease with heart failure and stage 1 through stage 4 chronic kidney disease, or unspecified chronic kidney disease: Secondary | ICD-10-CM | POA: Diagnosis not present

## 2017-08-21 DIAGNOSIS — N183 Chronic kidney disease, stage 3 (moderate): Secondary | ICD-10-CM | POA: Diagnosis not present

## 2017-08-21 DIAGNOSIS — I482 Chronic atrial fibrillation: Secondary | ICD-10-CM | POA: Diagnosis not present

## 2017-08-21 DIAGNOSIS — I5032 Chronic diastolic (congestive) heart failure: Secondary | ICD-10-CM | POA: Diagnosis not present

## 2017-08-21 DIAGNOSIS — I350 Nonrheumatic aortic (valve) stenosis: Secondary | ICD-10-CM | POA: Diagnosis not present

## 2017-08-26 DIAGNOSIS — I5032 Chronic diastolic (congestive) heart failure: Secondary | ICD-10-CM | POA: Diagnosis not present

## 2017-08-26 DIAGNOSIS — I350 Nonrheumatic aortic (valve) stenosis: Secondary | ICD-10-CM | POA: Diagnosis not present

## 2017-08-26 DIAGNOSIS — I482 Chronic atrial fibrillation: Secondary | ICD-10-CM | POA: Diagnosis not present

## 2017-08-26 DIAGNOSIS — I25119 Atherosclerotic heart disease of native coronary artery with unspecified angina pectoris: Secondary | ICD-10-CM | POA: Diagnosis not present

## 2017-08-26 DIAGNOSIS — I130 Hypertensive heart and chronic kidney disease with heart failure and stage 1 through stage 4 chronic kidney disease, or unspecified chronic kidney disease: Secondary | ICD-10-CM | POA: Diagnosis not present

## 2017-08-26 DIAGNOSIS — N183 Chronic kidney disease, stage 3 (moderate): Secondary | ICD-10-CM | POA: Diagnosis not present

## 2017-08-27 DIAGNOSIS — I482 Chronic atrial fibrillation: Secondary | ICD-10-CM | POA: Diagnosis not present

## 2017-08-27 DIAGNOSIS — I25119 Atherosclerotic heart disease of native coronary artery with unspecified angina pectoris: Secondary | ICD-10-CM | POA: Diagnosis not present

## 2017-08-27 DIAGNOSIS — I350 Nonrheumatic aortic (valve) stenosis: Secondary | ICD-10-CM | POA: Diagnosis not present

## 2017-08-27 DIAGNOSIS — N183 Chronic kidney disease, stage 3 (moderate): Secondary | ICD-10-CM | POA: Diagnosis not present

## 2017-08-27 DIAGNOSIS — I130 Hypertensive heart and chronic kidney disease with heart failure and stage 1 through stage 4 chronic kidney disease, or unspecified chronic kidney disease: Secondary | ICD-10-CM | POA: Diagnosis not present

## 2017-08-27 DIAGNOSIS — I5032 Chronic diastolic (congestive) heart failure: Secondary | ICD-10-CM | POA: Diagnosis not present

## 2017-09-02 DIAGNOSIS — I482 Chronic atrial fibrillation: Secondary | ICD-10-CM | POA: Diagnosis not present

## 2017-09-02 DIAGNOSIS — N183 Chronic kidney disease, stage 3 (moderate): Secondary | ICD-10-CM | POA: Diagnosis not present

## 2017-09-02 DIAGNOSIS — I5032 Chronic diastolic (congestive) heart failure: Secondary | ICD-10-CM | POA: Diagnosis not present

## 2017-09-02 DIAGNOSIS — I25119 Atherosclerotic heart disease of native coronary artery with unspecified angina pectoris: Secondary | ICD-10-CM | POA: Diagnosis not present

## 2017-09-02 DIAGNOSIS — I350 Nonrheumatic aortic (valve) stenosis: Secondary | ICD-10-CM | POA: Diagnosis not present

## 2017-09-02 DIAGNOSIS — I130 Hypertensive heart and chronic kidney disease with heart failure and stage 1 through stage 4 chronic kidney disease, or unspecified chronic kidney disease: Secondary | ICD-10-CM | POA: Diagnosis not present

## 2017-09-04 DIAGNOSIS — N183 Chronic kidney disease, stage 3 (moderate): Secondary | ICD-10-CM | POA: Diagnosis not present

## 2017-09-04 DIAGNOSIS — I350 Nonrheumatic aortic (valve) stenosis: Secondary | ICD-10-CM | POA: Diagnosis not present

## 2017-09-04 DIAGNOSIS — I5032 Chronic diastolic (congestive) heart failure: Secondary | ICD-10-CM | POA: Diagnosis not present

## 2017-09-04 DIAGNOSIS — I25119 Atherosclerotic heart disease of native coronary artery with unspecified angina pectoris: Secondary | ICD-10-CM | POA: Diagnosis not present

## 2017-09-04 DIAGNOSIS — I130 Hypertensive heart and chronic kidney disease with heart failure and stage 1 through stage 4 chronic kidney disease, or unspecified chronic kidney disease: Secondary | ICD-10-CM | POA: Diagnosis not present

## 2017-09-04 DIAGNOSIS — I482 Chronic atrial fibrillation: Secondary | ICD-10-CM | POA: Diagnosis not present

## 2017-09-09 DIAGNOSIS — I5032 Chronic diastolic (congestive) heart failure: Secondary | ICD-10-CM | POA: Diagnosis not present

## 2017-09-09 DIAGNOSIS — I25119 Atherosclerotic heart disease of native coronary artery with unspecified angina pectoris: Secondary | ICD-10-CM | POA: Diagnosis not present

## 2017-09-09 DIAGNOSIS — I482 Chronic atrial fibrillation: Secondary | ICD-10-CM | POA: Diagnosis not present

## 2017-09-09 DIAGNOSIS — N183 Chronic kidney disease, stage 3 (moderate): Secondary | ICD-10-CM | POA: Diagnosis not present

## 2017-09-09 DIAGNOSIS — I350 Nonrheumatic aortic (valve) stenosis: Secondary | ICD-10-CM | POA: Diagnosis not present

## 2017-09-09 DIAGNOSIS — I130 Hypertensive heart and chronic kidney disease with heart failure and stage 1 through stage 4 chronic kidney disease, or unspecified chronic kidney disease: Secondary | ICD-10-CM | POA: Diagnosis not present

## 2017-09-11 DIAGNOSIS — I130 Hypertensive heart and chronic kidney disease with heart failure and stage 1 through stage 4 chronic kidney disease, or unspecified chronic kidney disease: Secondary | ICD-10-CM | POA: Diagnosis not present

## 2017-09-11 DIAGNOSIS — I482 Chronic atrial fibrillation: Secondary | ICD-10-CM | POA: Diagnosis not present

## 2017-09-11 DIAGNOSIS — I350 Nonrheumatic aortic (valve) stenosis: Secondary | ICD-10-CM | POA: Diagnosis not present

## 2017-09-11 DIAGNOSIS — N183 Chronic kidney disease, stage 3 (moderate): Secondary | ICD-10-CM | POA: Diagnosis not present

## 2017-09-11 DIAGNOSIS — I5032 Chronic diastolic (congestive) heart failure: Secondary | ICD-10-CM | POA: Diagnosis not present

## 2017-09-11 DIAGNOSIS — I25119 Atherosclerotic heart disease of native coronary artery with unspecified angina pectoris: Secondary | ICD-10-CM | POA: Diagnosis not present

## 2017-09-12 DIAGNOSIS — I482 Chronic atrial fibrillation: Secondary | ICD-10-CM | POA: Diagnosis not present

## 2017-09-12 DIAGNOSIS — D649 Anemia, unspecified: Secondary | ICD-10-CM | POA: Diagnosis not present

## 2017-09-12 DIAGNOSIS — N183 Chronic kidney disease, stage 3 (moderate): Secondary | ICD-10-CM | POA: Diagnosis not present

## 2017-09-12 DIAGNOSIS — E669 Obesity, unspecified: Secondary | ICD-10-CM | POA: Diagnosis not present

## 2017-09-12 DIAGNOSIS — K5790 Diverticulosis of intestine, part unspecified, without perforation or abscess without bleeding: Secondary | ICD-10-CM | POA: Diagnosis not present

## 2017-09-12 DIAGNOSIS — I5032 Chronic diastolic (congestive) heart failure: Secondary | ICD-10-CM | POA: Diagnosis not present

## 2017-09-12 DIAGNOSIS — I350 Nonrheumatic aortic (valve) stenosis: Secondary | ICD-10-CM | POA: Diagnosis not present

## 2017-09-12 DIAGNOSIS — I130 Hypertensive heart and chronic kidney disease with heart failure and stage 1 through stage 4 chronic kidney disease, or unspecified chronic kidney disease: Secondary | ICD-10-CM | POA: Diagnosis not present

## 2017-09-12 DIAGNOSIS — Z87440 Personal history of urinary (tract) infections: Secondary | ICD-10-CM | POA: Diagnosis not present

## 2017-09-12 DIAGNOSIS — I25119 Atherosclerotic heart disease of native coronary artery with unspecified angina pectoris: Secondary | ICD-10-CM | POA: Diagnosis not present

## 2017-09-16 DIAGNOSIS — I25119 Atherosclerotic heart disease of native coronary artery with unspecified angina pectoris: Secondary | ICD-10-CM | POA: Diagnosis not present

## 2017-09-16 DIAGNOSIS — I350 Nonrheumatic aortic (valve) stenosis: Secondary | ICD-10-CM | POA: Diagnosis not present

## 2017-09-16 DIAGNOSIS — N183 Chronic kidney disease, stage 3 (moderate): Secondary | ICD-10-CM | POA: Diagnosis not present

## 2017-09-16 DIAGNOSIS — I130 Hypertensive heart and chronic kidney disease with heart failure and stage 1 through stage 4 chronic kidney disease, or unspecified chronic kidney disease: Secondary | ICD-10-CM | POA: Diagnosis not present

## 2017-09-16 DIAGNOSIS — I5032 Chronic diastolic (congestive) heart failure: Secondary | ICD-10-CM | POA: Diagnosis not present

## 2017-09-16 DIAGNOSIS — I482 Chronic atrial fibrillation: Secondary | ICD-10-CM | POA: Diagnosis not present

## 2017-09-19 DIAGNOSIS — I25119 Atherosclerotic heart disease of native coronary artery with unspecified angina pectoris: Secondary | ICD-10-CM | POA: Diagnosis not present

## 2017-09-19 DIAGNOSIS — N183 Chronic kidney disease, stage 3 (moderate): Secondary | ICD-10-CM | POA: Diagnosis not present

## 2017-09-19 DIAGNOSIS — I130 Hypertensive heart and chronic kidney disease with heart failure and stage 1 through stage 4 chronic kidney disease, or unspecified chronic kidney disease: Secondary | ICD-10-CM | POA: Diagnosis not present

## 2017-09-19 DIAGNOSIS — I482 Chronic atrial fibrillation: Secondary | ICD-10-CM | POA: Diagnosis not present

## 2017-09-19 DIAGNOSIS — I350 Nonrheumatic aortic (valve) stenosis: Secondary | ICD-10-CM | POA: Diagnosis not present

## 2017-09-19 DIAGNOSIS — I5032 Chronic diastolic (congestive) heart failure: Secondary | ICD-10-CM | POA: Diagnosis not present

## 2017-09-20 DIAGNOSIS — N183 Chronic kidney disease, stage 3 (moderate): Secondary | ICD-10-CM | POA: Diagnosis not present

## 2017-09-20 DIAGNOSIS — I25119 Atherosclerotic heart disease of native coronary artery with unspecified angina pectoris: Secondary | ICD-10-CM | POA: Diagnosis not present

## 2017-09-20 DIAGNOSIS — I130 Hypertensive heart and chronic kidney disease with heart failure and stage 1 through stage 4 chronic kidney disease, or unspecified chronic kidney disease: Secondary | ICD-10-CM | POA: Diagnosis not present

## 2017-09-20 DIAGNOSIS — I350 Nonrheumatic aortic (valve) stenosis: Secondary | ICD-10-CM | POA: Diagnosis not present

## 2017-09-20 DIAGNOSIS — I482 Chronic atrial fibrillation: Secondary | ICD-10-CM | POA: Diagnosis not present

## 2017-09-20 DIAGNOSIS — I5032 Chronic diastolic (congestive) heart failure: Secondary | ICD-10-CM | POA: Diagnosis not present

## 2017-09-23 DIAGNOSIS — I482 Chronic atrial fibrillation: Secondary | ICD-10-CM | POA: Diagnosis not present

## 2017-09-23 DIAGNOSIS — I25119 Atherosclerotic heart disease of native coronary artery with unspecified angina pectoris: Secondary | ICD-10-CM | POA: Diagnosis not present

## 2017-09-23 DIAGNOSIS — I5032 Chronic diastolic (congestive) heart failure: Secondary | ICD-10-CM | POA: Diagnosis not present

## 2017-09-23 DIAGNOSIS — I350 Nonrheumatic aortic (valve) stenosis: Secondary | ICD-10-CM | POA: Diagnosis not present

## 2017-09-23 DIAGNOSIS — N183 Chronic kidney disease, stage 3 (moderate): Secondary | ICD-10-CM | POA: Diagnosis not present

## 2017-09-23 DIAGNOSIS — I130 Hypertensive heart and chronic kidney disease with heart failure and stage 1 through stage 4 chronic kidney disease, or unspecified chronic kidney disease: Secondary | ICD-10-CM | POA: Diagnosis not present

## 2017-09-24 DIAGNOSIS — I130 Hypertensive heart and chronic kidney disease with heart failure and stage 1 through stage 4 chronic kidney disease, or unspecified chronic kidney disease: Secondary | ICD-10-CM | POA: Diagnosis not present

## 2017-09-24 DIAGNOSIS — I350 Nonrheumatic aortic (valve) stenosis: Secondary | ICD-10-CM | POA: Diagnosis not present

## 2017-09-24 DIAGNOSIS — I25119 Atherosclerotic heart disease of native coronary artery with unspecified angina pectoris: Secondary | ICD-10-CM | POA: Diagnosis not present

## 2017-09-24 DIAGNOSIS — I482 Chronic atrial fibrillation: Secondary | ICD-10-CM | POA: Diagnosis not present

## 2017-09-24 DIAGNOSIS — N183 Chronic kidney disease, stage 3 (moderate): Secondary | ICD-10-CM | POA: Diagnosis not present

## 2017-09-24 DIAGNOSIS — I5032 Chronic diastolic (congestive) heart failure: Secondary | ICD-10-CM | POA: Diagnosis not present

## 2017-09-25 DIAGNOSIS — I350 Nonrheumatic aortic (valve) stenosis: Secondary | ICD-10-CM | POA: Diagnosis not present

## 2017-09-25 DIAGNOSIS — I130 Hypertensive heart and chronic kidney disease with heart failure and stage 1 through stage 4 chronic kidney disease, or unspecified chronic kidney disease: Secondary | ICD-10-CM | POA: Diagnosis not present

## 2017-09-25 DIAGNOSIS — N183 Chronic kidney disease, stage 3 (moderate): Secondary | ICD-10-CM | POA: Diagnosis not present

## 2017-09-25 DIAGNOSIS — I25119 Atherosclerotic heart disease of native coronary artery with unspecified angina pectoris: Secondary | ICD-10-CM | POA: Diagnosis not present

## 2017-09-25 DIAGNOSIS — I5032 Chronic diastolic (congestive) heart failure: Secondary | ICD-10-CM | POA: Diagnosis not present

## 2017-09-25 DIAGNOSIS — I482 Chronic atrial fibrillation: Secondary | ICD-10-CM | POA: Diagnosis not present

## 2017-09-26 ENCOUNTER — Ambulatory Visit: Payer: Medicare Other | Admitting: Podiatry

## 2017-10-12 DEATH — deceased

## 2018-04-20 IMAGING — CT CT L SPINE W/O CM
3 of 6 series · 13 of 33 positions shown, 15 images · non-contrast
Comparison: 12/03/2015

CLINICAL DATA: Fall, back pain.

EXAM:
CT LUMBAR SPINE WITHOUT CONTRAST
TECHNIQUE: Multidetector CT imaging of the lumbar spine was performed without
intravenous contrast administration. Multiplanar CT image
reconstructions were also generated.

[Series 6: coronal bone · coronal · 0.26mm/px · 1 of 52 slices shown]
[im 26/52  bone]
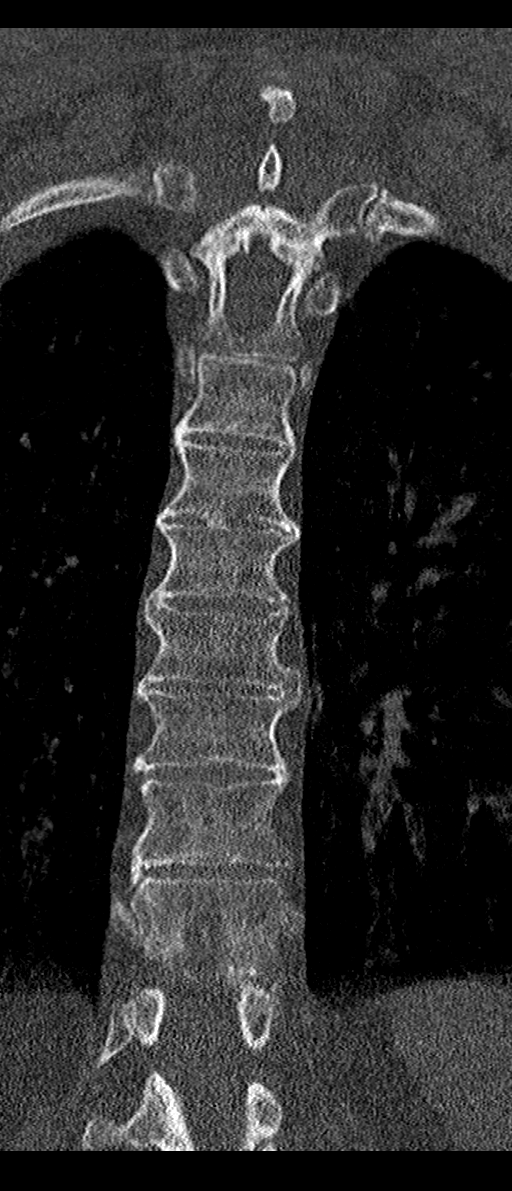

[Series 10: sagittal bone · sagittal · 0.25mm/px · 5 of 54 slices shown]
[im 9/54  bone]
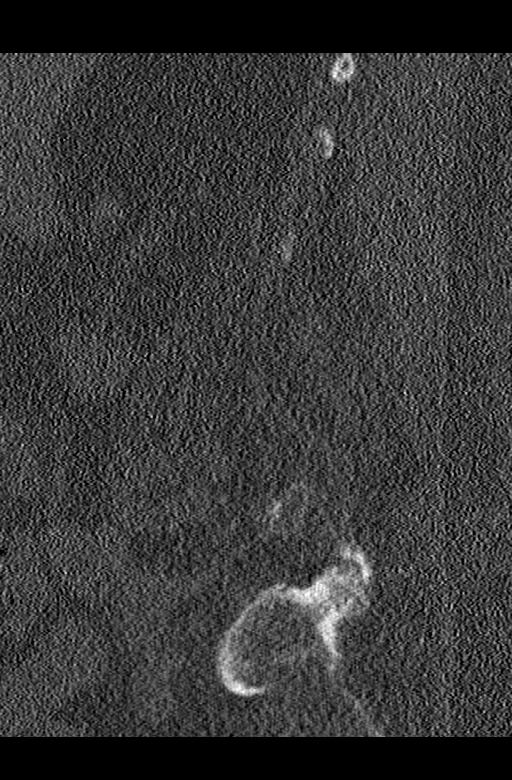
[im 18/54  bone]
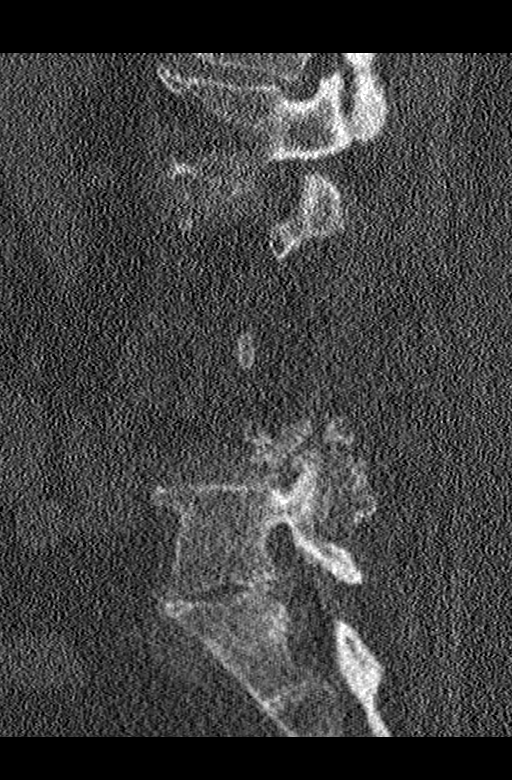
[im 27/54  bone]
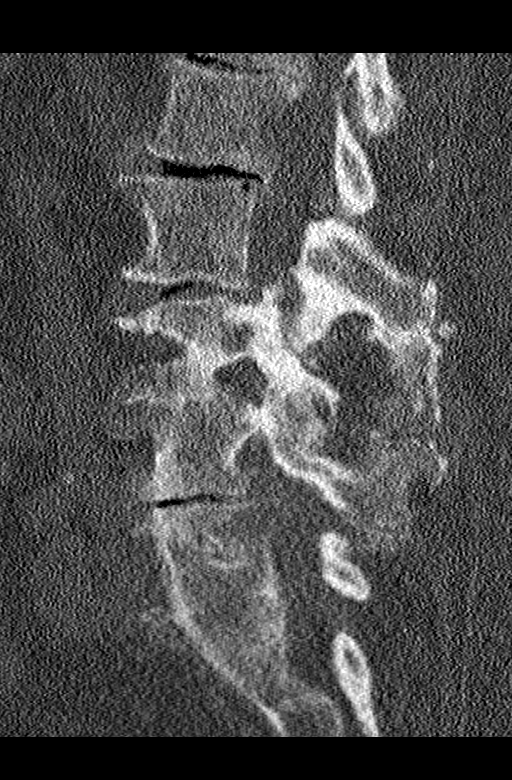
[im 36/54  bone]
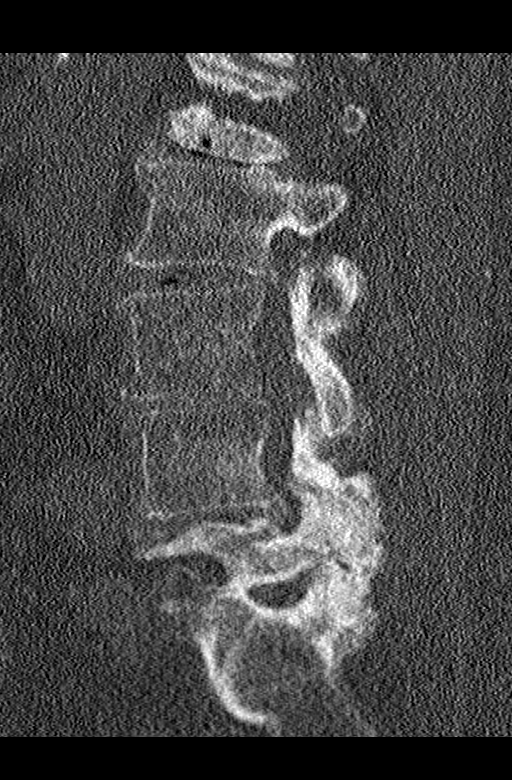
[im 45/54  bone]
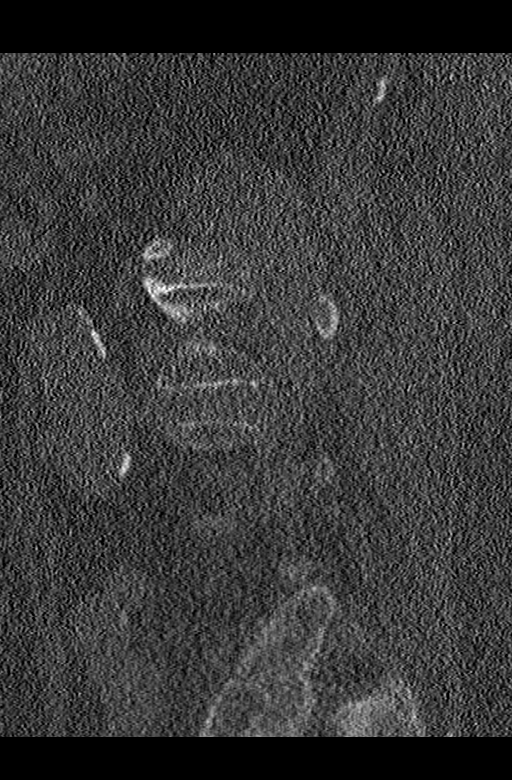

[Series 15: t spine soft · axial · 0.32mm/px · z∈[+1000,+1216]mm · 7 of 145 slices shown, 9 images]
[im 19/145  soft-tissue]
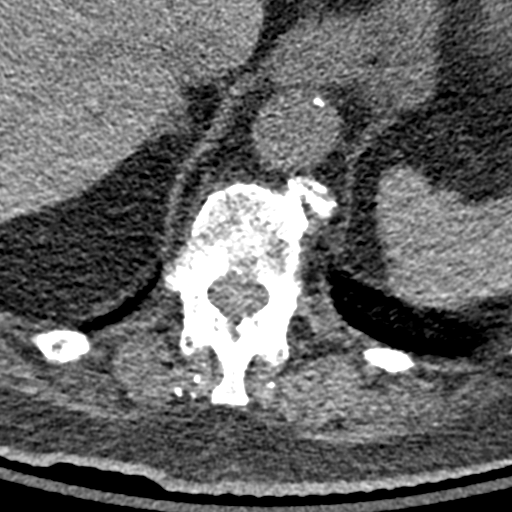
[im 19/145  bone]
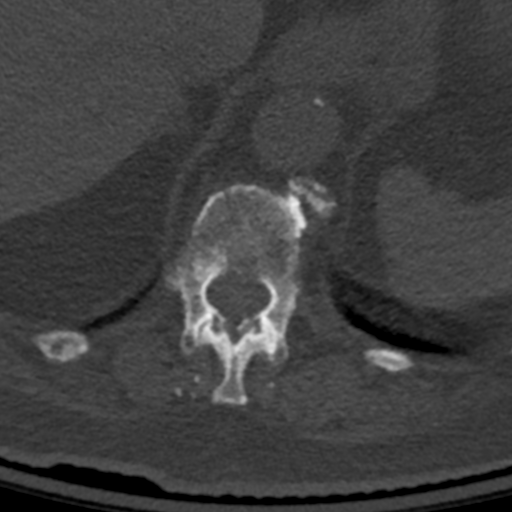
[im 37/145  bone]
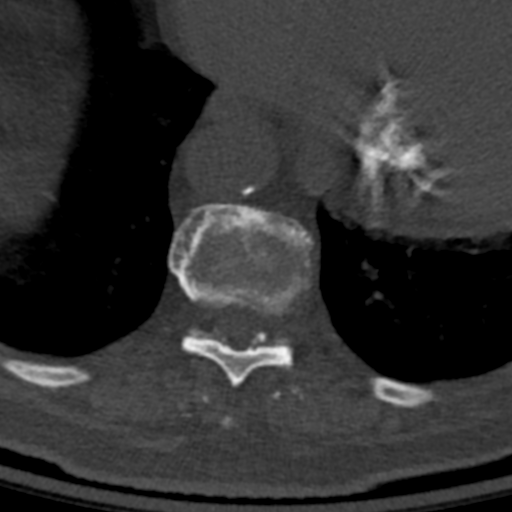
[im 55/145  bone]
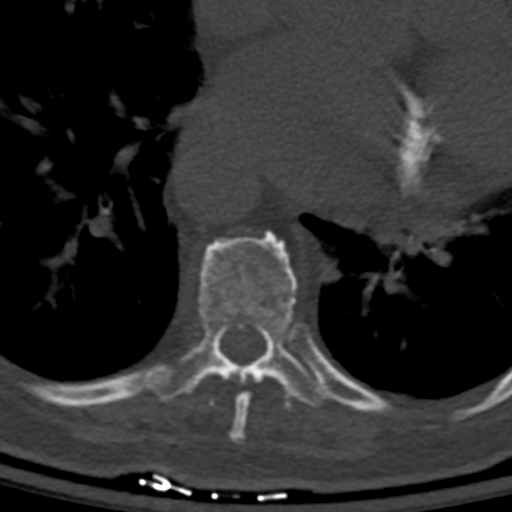
[im 73/145  bone]
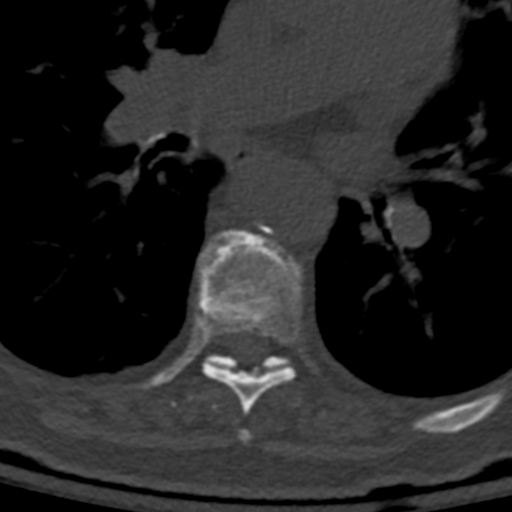
[im 91/145  soft-tissue]
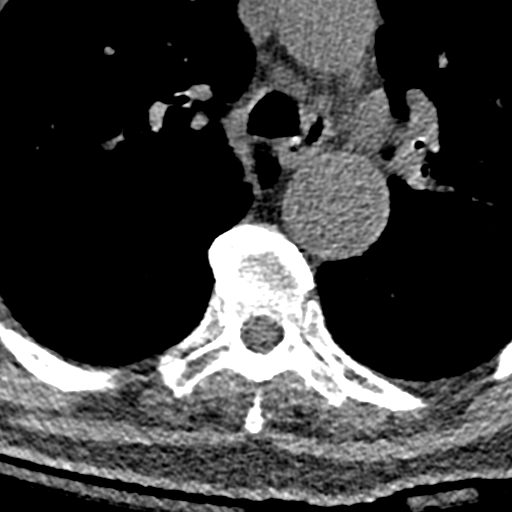
[im 91/145  bone]
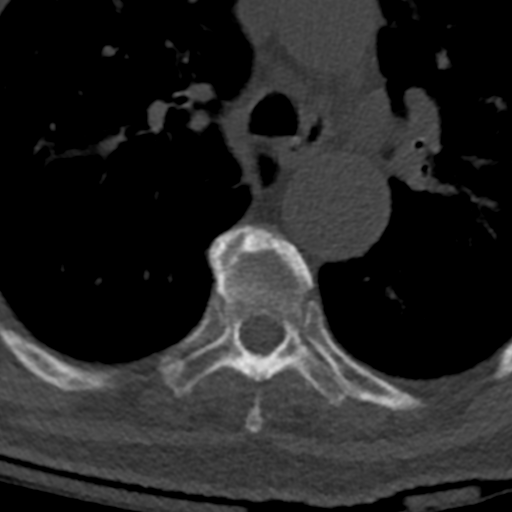
[im 109/145  bone]
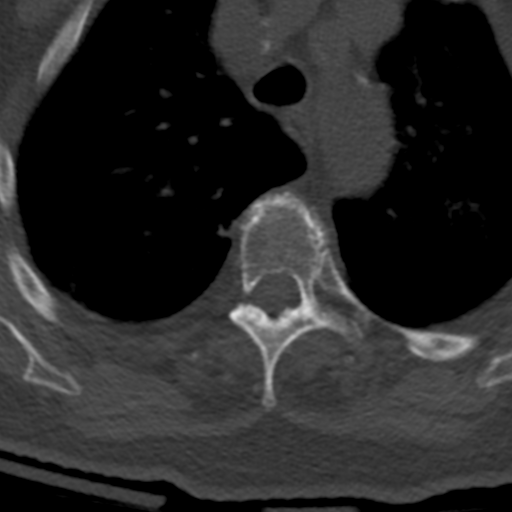
[im 127/145  bone]
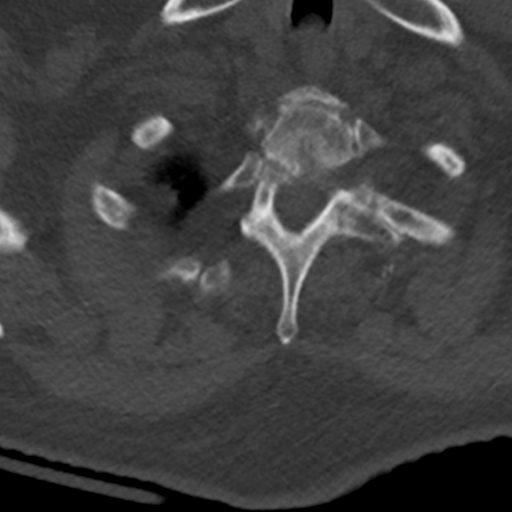

[13 of 33 positions shown; findings below may reference images not displayed]

FINDINGS: There is levoconvex lumbar scoliosis with considerable rotary
component. Acquired interbody fusion at L5-S1 with prominent loss of
disc height at all levels except L2-3. Vacuum disc phenomenon at
multiple levels. Grade 1 anterolisthesis at L4-5.

There is likely Baastrup's disease at multiple levels today's scan
included through the S1- 2 level but not down into the sacrum.

Aortoiliac atherosclerotic vascular disease. Additional findings at
individual levels are as follows:

L1-2: Moderate right and mild left foraminal stenosis and moderate
central narrowing of the thecal sac due to intervertebral spurring,
facet spurring, and ligamentum flavum redundancy.

L2-3: Mild central narrowing of the thecal sac and borderline
bilateral foraminal stenosis due to facet arthropathy and
intervertebral spurring.

L3-4: Moderate to prominent central narrowing of the thecal sac with
mild to moderate bilateral foraminal stenosis due to facet
arthropathy, intervertebral spurring, and disc bulge.

L4-5: Prominent central narrowing of the thecal sac with at least
moderate right and mild left foraminal stenosis due to disc
uncovering, facet arthropathy, and intervertebral spurring.

L5-S1: No impingement. Disc bulge noted with intervertebral and
facet spurring.
IMPRESSION: 1. No acute fracture or subluxation involving the lumbar spine is
identified.
2. Considerable lumbar spondylosis, scoliosis, and degenerative disc
disease, resulting in multilevel impingement as detailed above.
3.  Aortoiliac atherosclerotic vascular disease.

## 2018-05-31 IMAGING — DX DG CHEST 2V
2 series · 2 of 2 positions shown · non-contrast
Comparison: Radiographs December 03, 2015.

CLINICAL DATA: Bilateral lower extremity swelling.

EXAM:
CHEST  2 VIEW

[chest lat]
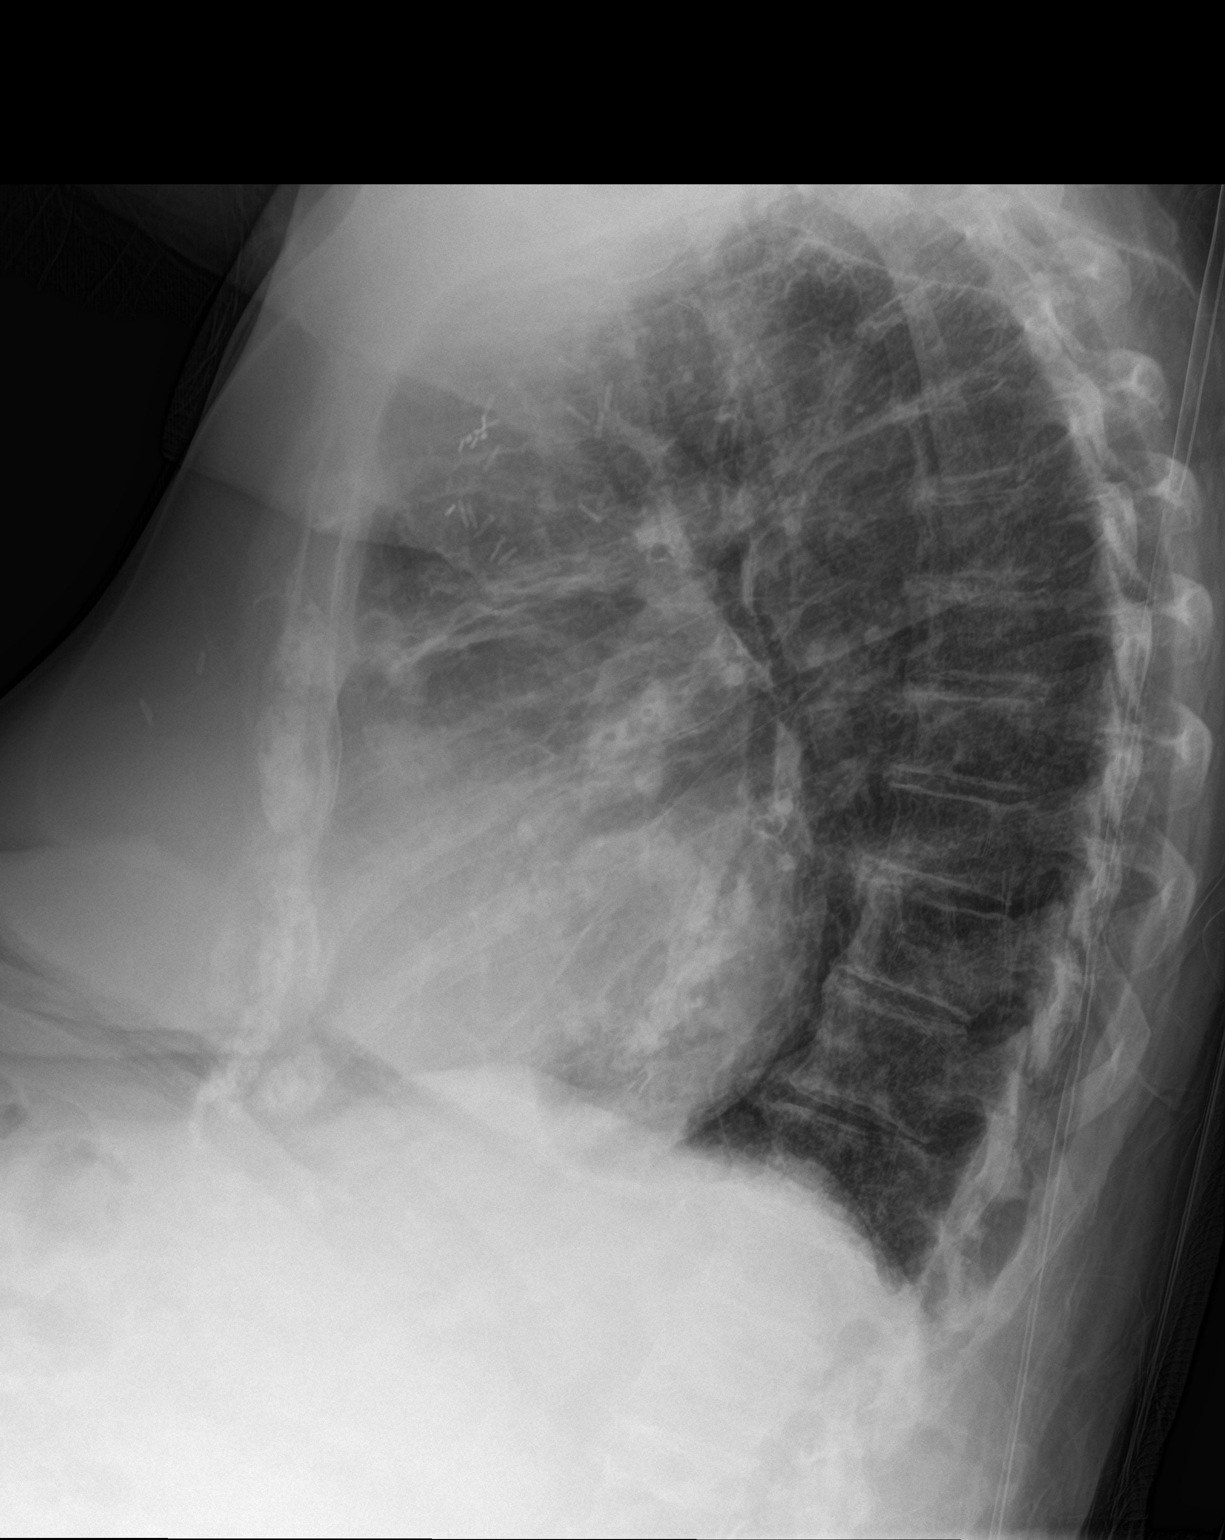

[chest ap]
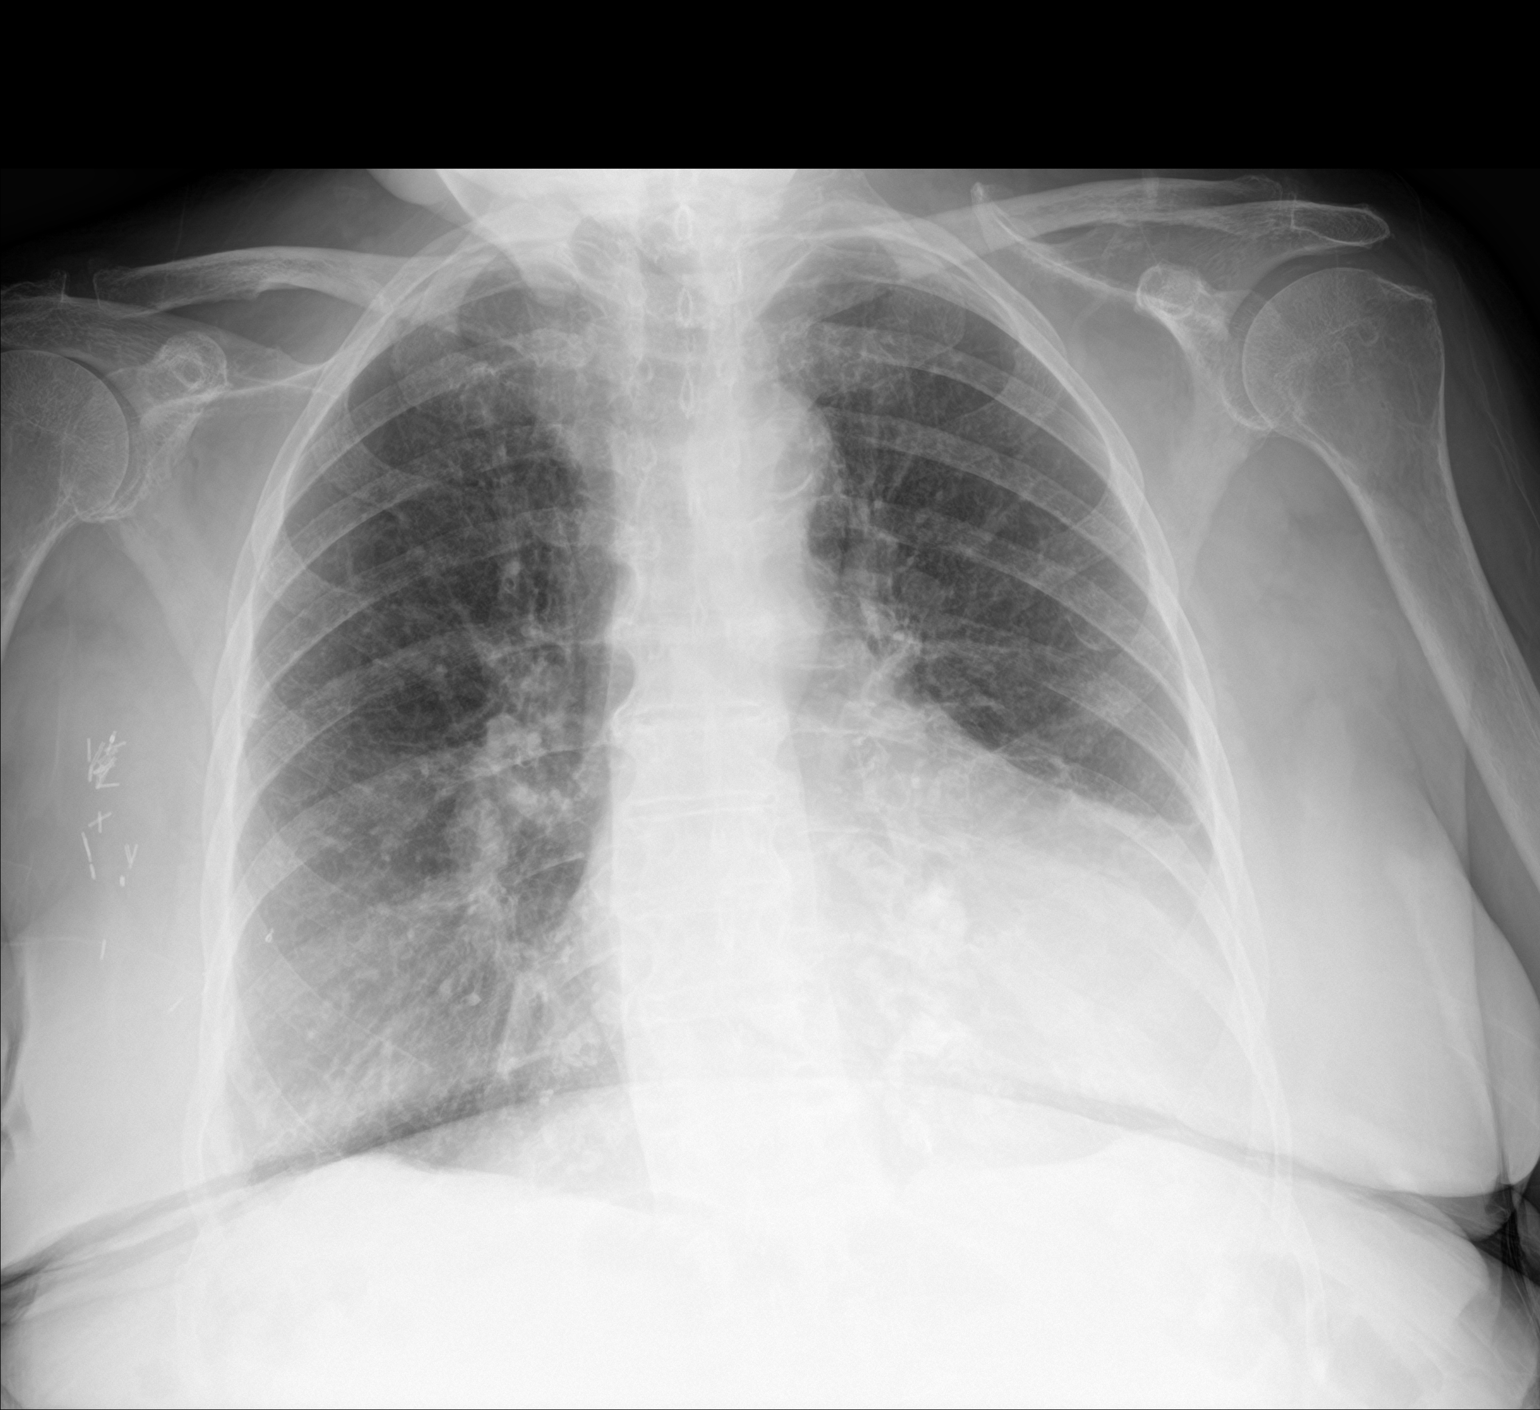

[2 of 2 positions shown; findings below may reference images not displayed]

FINDINGS: Stable cardiomegaly. Right axillary surgical clips are noted. No
pneumothorax or pleural effusion is noted. No acute pulmonary
disease is noted. Bony thorax is unremarkable.
IMPRESSION: No active cardiopulmonary disease.
